# Patient Record
Sex: Female | Born: 1960 | Race: White | Hispanic: No | Marital: Married | Smoking: Former smoker
Health system: Southern US, Community
[De-identification: ages and names within clinical notes are randomized; demographics above are authoritative.]

## PROBLEM LIST (undated history)

## (undated) DIAGNOSIS — R0602 Shortness of breath: Secondary | ICD-10-CM

## (undated) DIAGNOSIS — R002 Palpitations: Secondary | ICD-10-CM

## (undated) DIAGNOSIS — M7989 Other specified soft tissue disorders: Secondary | ICD-10-CM

## (undated) DIAGNOSIS — R42 Dizziness and giddiness: Secondary | ICD-10-CM

## (undated) DIAGNOSIS — R12 Heartburn: Secondary | ICD-10-CM

## (undated) DIAGNOSIS — C801 Malignant (primary) neoplasm, unspecified: Secondary | ICD-10-CM

## (undated) DIAGNOSIS — M2550 Pain in unspecified joint: Secondary | ICD-10-CM

## (undated) DIAGNOSIS — I10 Essential (primary) hypertension: Secondary | ICD-10-CM

## (undated) DIAGNOSIS — Z9221 Personal history of antineoplastic chemotherapy: Secondary | ICD-10-CM

## (undated) DIAGNOSIS — C50919 Malignant neoplasm of unspecified site of unspecified female breast: Secondary | ICD-10-CM

## (undated) DIAGNOSIS — R569 Unspecified convulsions: Secondary | ICD-10-CM

## (undated) DIAGNOSIS — Z923 Personal history of irradiation: Secondary | ICD-10-CM

## (undated) DIAGNOSIS — K5900 Constipation, unspecified: Secondary | ICD-10-CM

## (undated) HISTORY — DX: Heartburn: R12

## (undated) HISTORY — PX: WISDOM TOOTH EXTRACTION: SHX21

## (undated) HISTORY — DX: Other specified soft tissue disorders: M79.89

## (undated) HISTORY — DX: Pain in unspecified joint: M25.50

## (undated) HISTORY — DX: Shortness of breath: R06.02

## (undated) HISTORY — PX: COLONOSCOPY: SHX174

## (undated) HISTORY — DX: Dizziness and giddiness: R42

## (undated) HISTORY — DX: Constipation, unspecified: K59.00

## (undated) HISTORY — DX: Palpitations: R00.2

---

## 1988-02-28 DIAGNOSIS — C801 Malignant (primary) neoplasm, unspecified: Secondary | ICD-10-CM

## 1988-02-28 HISTORY — DX: Malignant (primary) neoplasm, unspecified: C80.1

## 2006-03-01 ENCOUNTER — Emergency Department (HOSPITAL_COMMUNITY): Admission: EM | Admit: 2006-03-01 | Discharge: 2006-03-01 | Payer: Self-pay | Admitting: Emergency Medicine

## 2008-02-27 ENCOUNTER — Emergency Department (HOSPITAL_COMMUNITY): Admission: EM | Admit: 2008-02-27 | Discharge: 2008-02-27 | Payer: Self-pay | Admitting: Family Medicine

## 2015-02-28 DIAGNOSIS — Z9221 Personal history of antineoplastic chemotherapy: Secondary | ICD-10-CM

## 2015-02-28 DIAGNOSIS — Z923 Personal history of irradiation: Secondary | ICD-10-CM

## 2015-02-28 DIAGNOSIS — C50919 Malignant neoplasm of unspecified site of unspecified female breast: Secondary | ICD-10-CM

## 2015-02-28 HISTORY — DX: Malignant neoplasm of unspecified site of unspecified female breast: C50.919

## 2015-02-28 HISTORY — DX: Personal history of irradiation: Z92.3

## 2015-02-28 HISTORY — DX: Personal history of antineoplastic chemotherapy: Z92.21

## 2015-10-27 ENCOUNTER — Other Ambulatory Visit: Payer: Self-pay | Admitting: General Surgery

## 2015-11-04 ENCOUNTER — Other Ambulatory Visit: Payer: Self-pay | Admitting: General Surgery

## 2015-11-04 ENCOUNTER — Other Ambulatory Visit: Payer: Self-pay

## 2015-11-04 ENCOUNTER — Encounter (HOSPITAL_BASED_OUTPATIENT_CLINIC_OR_DEPARTMENT_OTHER): Payer: Self-pay | Admitting: *Deleted

## 2015-11-04 ENCOUNTER — Encounter (HOSPITAL_BASED_OUTPATIENT_CLINIC_OR_DEPARTMENT_OTHER)
Admission: RE | Admit: 2015-11-04 | Discharge: 2015-11-04 | Disposition: A | Discharge status: Home / Self Care | Payer: BLUE CROSS/BLUE SHIELD | Source: Ambulatory Visit | Attending: General Surgery | Admitting: General Surgery

## 2015-11-04 DIAGNOSIS — C50912 Malignant neoplasm of unspecified site of left female breast: Secondary | ICD-10-CM | POA: Insufficient documentation

## 2015-11-04 DIAGNOSIS — C50412 Malignant neoplasm of upper-outer quadrant of left female breast: Secondary | ICD-10-CM

## 2015-11-04 DIAGNOSIS — I10 Essential (primary) hypertension: Secondary | ICD-10-CM | POA: Diagnosis not present

## 2015-11-04 DIAGNOSIS — R569 Unspecified convulsions: Secondary | ICD-10-CM | POA: Insufficient documentation

## 2015-11-04 DIAGNOSIS — Z01812 Encounter for preprocedural laboratory examination: Secondary | ICD-10-CM | POA: Diagnosis present

## 2015-11-04 DIAGNOSIS — Z170 Estrogen receptor positive status [ER+]: Secondary | ICD-10-CM | POA: Diagnosis not present

## 2015-11-04 DIAGNOSIS — Z8541 Personal history of malignant neoplasm of cervix uteri: Secondary | ICD-10-CM | POA: Insufficient documentation

## 2015-11-04 DIAGNOSIS — C439 Malignant melanoma of skin, unspecified: Secondary | ICD-10-CM | POA: Diagnosis not present

## 2015-11-04 DIAGNOSIS — Z01810 Encounter for preprocedural cardiovascular examination: Secondary | ICD-10-CM | POA: Diagnosis present

## 2015-11-04 LAB — BASIC METABOLIC PANEL
Anion gap: 6 (ref 5–15)
BUN: 10 mg/dL (ref 6–20)
CALCIUM: 9.5 mg/dL (ref 8.9–10.3)
CO2: 29 mmol/L (ref 22–32)
CREATININE: 0.67 mg/dL (ref 0.44–1.00)
Chloride: 105 mmol/L (ref 101–111)
GFR calc non Af Amer: >60 mL/min (ref >60)
Glucose, Bld: 92 mg/dL (ref 65–99)
Potassium: 4.6 mmol/L (ref 3.5–5.1)
SODIUM: 140 mmol/L (ref 135–145)

## 2015-11-04 NOTE — Progress Notes (Signed)
Pt given 8oz carton of boost breeze with written and verbal instructions to drink morning of surgery by 1000am and no other liquid after midnight,  Teach back and pt voiced understanding

## 2015-11-15 ENCOUNTER — Telehealth: Payer: Self-pay | Admitting: Hematology

## 2015-11-15 ENCOUNTER — Encounter: Payer: Self-pay | Admitting: Hematology

## 2015-11-15 ENCOUNTER — Ambulatory Visit
Admission: RE | Admit: 2015-11-15 | Discharge: 2015-11-15 | Disposition: A | Discharge status: Home / Self Care | Payer: BLUE CROSS/BLUE SHIELD | Source: Ambulatory Visit | Attending: General Surgery | Admitting: General Surgery

## 2015-11-15 DIAGNOSIS — C50412 Malignant neoplasm of upper-outer quadrant of left female breast: Secondary | ICD-10-CM

## 2015-11-15 NOTE — Telephone Encounter (Signed)
Appt scheduled with Dr. Burr Medico on 9/26 at 2:30pm. Patient aware to arrive 30 minutes early. Demographics verified. Letter mailed to the patient.

## 2015-11-16 ENCOUNTER — Encounter (HOSPITAL_BASED_OUTPATIENT_CLINIC_OR_DEPARTMENT_OTHER): Payer: Self-pay | Admitting: Anesthesiology

## 2015-11-16 ENCOUNTER — Ambulatory Visit (HOSPITAL_BASED_OUTPATIENT_CLINIC_OR_DEPARTMENT_OTHER): Payer: BLUE CROSS/BLUE SHIELD | Admitting: Anesthesiology

## 2015-11-16 ENCOUNTER — Encounter (HOSPITAL_BASED_OUTPATIENT_CLINIC_OR_DEPARTMENT_OTHER)
Admission: RE | Disposition: A | Discharge status: Home / Self Care | Payer: Self-pay | Source: Ambulatory Visit | Attending: General Surgery

## 2015-11-16 ENCOUNTER — Ambulatory Visit (HOSPITAL_COMMUNITY)
Admission: RE | Admit: 2015-11-16 | Discharge: 2015-11-16 | Disposition: A | Discharge status: Home / Self Care | Payer: BLUE CROSS/BLUE SHIELD | Source: Ambulatory Visit | Attending: General Surgery | Admitting: General Surgery

## 2015-11-16 ENCOUNTER — Ambulatory Visit (HOSPITAL_BASED_OUTPATIENT_CLINIC_OR_DEPARTMENT_OTHER)
Admission: RE | Admit: 2015-11-16 | Discharge: 2015-11-16 | Disposition: A | Discharge status: Home / Self Care | Payer: BLUE CROSS/BLUE SHIELD | Source: Ambulatory Visit | Attending: General Surgery | Admitting: General Surgery

## 2015-11-16 ENCOUNTER — Ambulatory Visit
Admission: RE | Admit: 2015-11-16 | Discharge: 2015-11-16 | Disposition: A | Discharge status: Home / Self Care | Payer: Self-pay | Source: Ambulatory Visit | Attending: General Surgery | Admitting: General Surgery

## 2015-11-16 DIAGNOSIS — Z6841 Body Mass Index (BMI) 40.0 and over, adult: Secondary | ICD-10-CM | POA: Insufficient documentation

## 2015-11-16 DIAGNOSIS — C50412 Malignant neoplasm of upper-outer quadrant of left female breast: Secondary | ICD-10-CM

## 2015-11-16 DIAGNOSIS — I10 Essential (primary) hypertension: Secondary | ICD-10-CM | POA: Insufficient documentation

## 2015-11-16 DIAGNOSIS — C50912 Malignant neoplasm of unspecified site of left female breast: Secondary | ICD-10-CM

## 2015-11-16 DIAGNOSIS — Z79899 Other long term (current) drug therapy: Secondary | ICD-10-CM | POA: Insufficient documentation

## 2015-11-16 DIAGNOSIS — Z85820 Personal history of malignant melanoma of skin: Secondary | ICD-10-CM | POA: Diagnosis not present

## 2015-11-16 DIAGNOSIS — E669 Obesity, unspecified: Secondary | ICD-10-CM | POA: Insufficient documentation

## 2015-11-16 DIAGNOSIS — Z87891 Personal history of nicotine dependence: Secondary | ICD-10-CM | POA: Insufficient documentation

## 2015-11-16 DIAGNOSIS — Z8541 Personal history of malignant neoplasm of cervix uteri: Secondary | ICD-10-CM | POA: Diagnosis not present

## 2015-11-16 DIAGNOSIS — Z170 Estrogen receptor positive status [ER+]: Secondary | ICD-10-CM | POA: Diagnosis not present

## 2015-11-16 DIAGNOSIS — Z780 Asymptomatic menopausal state: Secondary | ICD-10-CM | POA: Insufficient documentation

## 2015-11-16 HISTORY — PX: BREAST LUMPECTOMY: SHX2

## 2015-11-16 HISTORY — PX: BREAST LUMPECTOMY WITH RADIOACTIVE SEED AND SENTINEL LYMPH NODE BIOPSY: SHX6550

## 2015-11-16 HISTORY — DX: Malignant (primary) neoplasm, unspecified: C80.1

## 2015-11-16 HISTORY — DX: Unspecified convulsions: R56.9

## 2015-11-16 HISTORY — DX: Essential (primary) hypertension: I10

## 2015-11-16 SURGERY — BREAST LUMPECTOMY WITH RADIOACTIVE SEED AND SENTINEL LYMPH NODE BIOPSY
Anesthesia: General | Site: Breast | Laterality: Left

## 2015-11-16 MED ORDER — PROMETHAZINE HCL 25 MG/ML IJ SOLN
6.2500 mg | INTRAMUSCULAR | Status: DC | PRN
  Administered 2015-11-16: 6.25 mg via INTRAVENOUS

## 2015-11-16 MED ORDER — DEXAMETHASONE SODIUM PHOSPHATE 10 MG/ML IJ SOLN
INTRAMUSCULAR | Status: AC
  Filled 2015-11-16: qty 1

## 2015-11-16 MED ORDER — CELECOXIB 200 MG PO CAPS
ORAL_CAPSULE | ORAL | Status: AC
  Filled 2015-11-16: qty 2

## 2015-11-16 MED ORDER — CELECOXIB 400 MG PO CAPS
400.0000 mg | ORAL_CAPSULE | ORAL | Status: AC
  Administered 2015-11-16: 200 mg via ORAL

## 2015-11-16 MED ORDER — LIDOCAINE 2% (20 MG/ML) 5 ML SYRINGE
INTRAMUSCULAR | Status: AC
  Filled 2015-11-16: qty 5

## 2015-11-16 MED ORDER — PHENYLEPHRINE 40 MCG/ML (10ML) SYRINGE FOR IV PUSH (FOR BLOOD PRESSURE SUPPORT)
PREFILLED_SYRINGE | INTRAVENOUS | Status: AC
  Filled 2015-11-16: qty 10

## 2015-11-16 MED ORDER — PROMETHAZINE HCL 25 MG/ML IJ SOLN
INTRAMUSCULAR | Status: AC
  Filled 2015-11-16: qty 1

## 2015-11-16 MED ORDER — CEFAZOLIN IN D5W 1 GM/50ML IV SOLN
INTRAVENOUS | Status: AC
  Filled 2015-11-16: qty 50

## 2015-11-16 MED ORDER — MIDAZOLAM HCL 2 MG/2ML IJ SOLN
1.0000 mg | INTRAMUSCULAR | Status: DC | PRN
  Administered 2015-11-16: 2 mg via INTRAVENOUS

## 2015-11-16 MED ORDER — HYDROMORPHONE HCL 1 MG/ML IJ SOLN
0.2500 mg | INTRAMUSCULAR | Status: DC | PRN
  Administered 2015-11-16: 0.5 mg via INTRAVENOUS

## 2015-11-16 MED ORDER — FENTANYL CITRATE (PF) 100 MCG/2ML IJ SOLN
INTRAMUSCULAR | Status: DC | PRN
  Administered 2015-11-16: 100 ug via INTRAVENOUS
  Administered 2015-11-16: 50 ug via INTRAVENOUS

## 2015-11-16 MED ORDER — SCOPOLAMINE 1 MG/3DAYS TD PT72: 1.0000 | MEDICATED_PATCH | Freq: Once | TRANSDERMAL | Status: DC | PRN

## 2015-11-16 MED ORDER — HYDROCODONE-ACETAMINOPHEN 5-325 MG PO TABS: 1.0000 | ORAL_TABLET | ORAL | 0 refills | Status: DC | PRN

## 2015-11-16 MED ORDER — BUPIVACAINE-EPINEPHRINE (PF) 0.5% -1:200000 IJ SOLN
INTRAMUSCULAR | Status: DC | PRN
  Administered 2015-11-16: 20 mL

## 2015-11-16 MED ORDER — PROPOFOL 10 MG/ML IV BOLUS
INTRAVENOUS | Status: DC | PRN
  Administered 2015-11-16: 200 mg via INTRAVENOUS

## 2015-11-16 MED ORDER — OXYCODONE HCL 5 MG PO TABS
5.0000 mg | ORAL_TABLET | Freq: Once | ORAL | Status: DC | PRN
Start: 2015-11-16 — End: 2015-11-16

## 2015-11-16 MED ORDER — FENTANYL CITRATE (PF) 100 MCG/2ML IJ SOLN
50.0000 ug | INTRAMUSCULAR | Status: DC | PRN
  Administered 2015-11-16 (×2): 50 ug via INTRAVENOUS

## 2015-11-16 MED ORDER — LACTATED RINGERS IV SOLN
INTRAVENOUS | Status: DC
  Administered 2015-11-16: 10 mL/h via INTRAVENOUS
  Administered 2015-11-16 (×3): via INTRAVENOUS

## 2015-11-16 MED ORDER — FENTANYL CITRATE (PF) 100 MCG/2ML IJ SOLN
INTRAMUSCULAR | Status: AC
  Filled 2015-11-16: qty 2

## 2015-11-16 MED ORDER — PROPOFOL 10 MG/ML IV BOLUS
INTRAVENOUS | Status: AC
  Filled 2015-11-16: qty 20

## 2015-11-16 MED ORDER — BUPIVACAINE-EPINEPHRINE (PF) 0.5% -1:200000 IJ SOLN
INTRAMUSCULAR | Status: DC | PRN
  Administered 2015-11-16: 30 mL

## 2015-11-16 MED ORDER — CHLORHEXIDINE GLUCONATE CLOTH 2 % EX PADS: 6.0000 | MEDICATED_PAD | Freq: Once | CUTANEOUS | Status: DC

## 2015-11-16 MED ORDER — MIDAZOLAM HCL 2 MG/2ML IJ SOLN
INTRAMUSCULAR | Status: AC
  Filled 2015-11-16: qty 2

## 2015-11-16 MED ORDER — LACTATED RINGERS IV SOLN: INTRAVENOUS | Status: DC

## 2015-11-16 MED ORDER — SODIUM CHLORIDE 0.9 % IJ SOLN
INTRAMUSCULAR | Status: AC
  Filled 2015-11-16: qty 10

## 2015-11-16 MED ORDER — EPHEDRINE SULFATE 50 MG/ML IJ SOLN
INTRAMUSCULAR | Status: DC | PRN
  Administered 2015-11-16: 10 mg via INTRAVENOUS

## 2015-11-16 MED ORDER — LIDOCAINE HCL (CARDIAC) 20 MG/ML IV SOLN
INTRAVENOUS | Status: DC | PRN
  Administered 2015-11-16: 30 mg via INTRAVENOUS

## 2015-11-16 MED ORDER — CEFAZOLIN SODIUM-DEXTROSE 2-4 GM/100ML-% IV SOLN
INTRAVENOUS | Status: AC
  Filled 2015-11-16: qty 100

## 2015-11-16 MED ORDER — METHYLENE BLUE 0.5 % INJ SOLN
INTRAVENOUS | Status: AC
  Filled 2015-11-16: qty 10

## 2015-11-16 MED ORDER — GABAPENTIN 300 MG PO CAPS
300.0000 mg | ORAL_CAPSULE | ORAL | Status: AC
  Administered 2015-11-16: 300 mg via ORAL

## 2015-11-16 MED ORDER — CEFAZOLIN SODIUM-DEXTROSE 2-4 GM/100ML-% IV SOLN
2.0000 g | INTRAVENOUS | Status: AC
  Administered 2015-11-16: 3 g via INTRAVENOUS

## 2015-11-16 MED ORDER — ONDANSETRON HCL 4 MG/2ML IJ SOLN
INTRAMUSCULAR | Status: AC
  Filled 2015-11-16: qty 2

## 2015-11-16 MED ORDER — PHENYLEPHRINE HCL 10 MG/ML IJ SOLN
INTRAMUSCULAR | Status: DC | PRN
  Administered 2015-11-16 (×3): 80 ug via INTRAVENOUS

## 2015-11-16 MED ORDER — MEPERIDINE HCL 25 MG/ML IJ SOLN: 6.2500 mg | INTRAMUSCULAR | Status: DC | PRN

## 2015-11-16 MED ORDER — TECHNETIUM TC 99M SULFUR COLLOID FILTERED
1.0000 | Freq: Once | INTRAVENOUS | Status: AC | PRN
  Administered 2015-11-16: 1 via INTRADERMAL

## 2015-11-16 MED ORDER — HYDROMORPHONE HCL 1 MG/ML IJ SOLN
INTRAMUSCULAR | Status: AC
  Filled 2015-11-16: qty 1

## 2015-11-16 MED ORDER — ACETAMINOPHEN 500 MG PO TABS
ORAL_TABLET | ORAL | Status: AC
  Filled 2015-11-16: qty 2

## 2015-11-16 MED ORDER — MIDAZOLAM HCL 5 MG/5ML IJ SOLN
INTRAMUSCULAR | Status: DC | PRN
  Administered 2015-11-16: 2 mg via INTRAVENOUS

## 2015-11-16 MED ORDER — GLYCOPYRROLATE 0.2 MG/ML IJ SOLN
0.2000 mg | Freq: Once | INTRAMUSCULAR | Status: DC | PRN
Start: 2015-11-16 — End: 2015-11-16

## 2015-11-16 MED ORDER — DEXAMETHASONE SODIUM PHOSPHATE 4 MG/ML IJ SOLN
INTRAMUSCULAR | Status: DC | PRN
  Administered 2015-11-16: 10 mg via INTRAVENOUS

## 2015-11-16 MED ORDER — GABAPENTIN 300 MG PO CAPS
ORAL_CAPSULE | ORAL | Status: AC
  Filled 2015-11-16: qty 1

## 2015-11-16 MED ORDER — OXYCODONE HCL 5 MG/5ML PO SOLN: 5.0000 mg | Freq: Once | ORAL | Status: DC | PRN

## 2015-11-16 MED ORDER — ACETAMINOPHEN 500 MG PO TABS
1000.0000 mg | ORAL_TABLET | ORAL | Status: AC
  Administered 2015-11-16: 1000 mg via ORAL

## 2015-11-16 SURGICAL SUPPLY — 53 items
BINDER BREAST 3XL (BIND) ×2 IMPLANT
BINDER BREAST LRG (GAUZE/BANDAGES/DRESSINGS) IMPLANT
BINDER BREAST MEDIUM (GAUZE/BANDAGES/DRESSINGS) IMPLANT
BINDER BREAST XLRG (GAUZE/BANDAGES/DRESSINGS) IMPLANT
BINDER BREAST XXLRG (GAUZE/BANDAGES/DRESSINGS) IMPLANT
BLADE SURG 15 STRL LF DISP TIS (BLADE) ×1 IMPLANT
BLADE SURG 15 STRL SS (BLADE) ×3
CANISTER SUC SOCK COL 7IN (MISCELLANEOUS) IMPLANT
CANISTER SUCT 1200ML W/VALVE (MISCELLANEOUS) ×3 IMPLANT
CHLORAPREP W/TINT 26ML (MISCELLANEOUS) ×3 IMPLANT
CLIP TI MEDIUM 6 (CLIP) ×3 IMPLANT
CLIP TI WIDE RED SMALL 6 (CLIP) ×2 IMPLANT
COVER BACK TABLE 60X90IN (DRAPES) ×3 IMPLANT
COVER MAYO STAND STRL (DRAPES) ×3 IMPLANT
COVER PROBE W GEL 5X96 (DRAPES) ×3 IMPLANT
DECANTER SPIKE VIAL GLASS SM (MISCELLANEOUS) IMPLANT
DEVICE DUBIN W/COMP PLATE 8390 (MISCELLANEOUS) ×3 IMPLANT
DRAPE LAPAROSCOPIC ABDOMINAL (DRAPES) ×3 IMPLANT
DRAPE UTILITY XL STRL (DRAPES) ×3 IMPLANT
ELECT COATED BLADE 2.86 ST (ELECTRODE) ×3 IMPLANT
ELECT REM PT RETURN 9FT ADLT (ELECTROSURGICAL) ×3
ELECTRODE REM PT RTRN 9FT ADLT (ELECTROSURGICAL) ×1 IMPLANT
GLOVE BIOGEL PI IND STRL 7.0 (GLOVE) IMPLANT
GLOVE BIOGEL PI IND STRL 8 (GLOVE) ×1 IMPLANT
GLOVE BIOGEL PI INDICATOR 7.0 (GLOVE) ×2
GLOVE BIOGEL PI INDICATOR 8 (GLOVE) ×2
GLOVE ECLIPSE 6.5 STRL STRAW (GLOVE) ×2 IMPLANT
GLOVE ECLIPSE 7.5 STRL STRAW (GLOVE) ×5 IMPLANT
GLOVE EXAM NITRILE EXT CUFF MD (GLOVE) ×2 IMPLANT
GOWN STRL REUS W/ TWL LRG LVL3 (GOWN DISPOSABLE) ×1 IMPLANT
GOWN STRL REUS W/ TWL XL LVL3 (GOWN DISPOSABLE) ×1 IMPLANT
GOWN STRL REUS W/TWL LRG LVL3 (GOWN DISPOSABLE) ×3
GOWN STRL REUS W/TWL XL LVL3 (GOWN DISPOSABLE) ×3
ILLUMINATOR WAVEGUIDE N/F (MISCELLANEOUS) ×3 IMPLANT
KIT MARKER MARGIN INK (KITS) ×3 IMPLANT
LIQUID BAND (GAUZE/BANDAGES/DRESSINGS) ×3 IMPLANT
NDL HYPO 25X1 1.5 SAFETY (NEEDLE) ×2 IMPLANT
NDL SAFETY ECLIPSE 18X1.5 (NEEDLE) ×1 IMPLANT
NEEDLE HYPO 18GX1.5 SHARP (NEEDLE)
NEEDLE HYPO 25X1 1.5 SAFETY (NEEDLE) ×3 IMPLANT
NS IRRIG 1000ML POUR BTL (IV SOLUTION) ×2 IMPLANT
PACK BASIN DAY SURGERY FS (CUSTOM PROCEDURE TRAY) ×3 IMPLANT
PENCIL BUTTON HOLSTER BLD 10FT (ELECTRODE) ×3 IMPLANT
SLEEVE SCD COMPRESS KNEE MED (MISCELLANEOUS) ×3 IMPLANT
SPONGE LAP 4X18 X RAY DECT (DISPOSABLE) ×3 IMPLANT
SUT MON AB 5-0 PS2 18 (SUTURE) ×3 IMPLANT
SUT VICRYL 3-0 CR8 SH (SUTURE) ×5 IMPLANT
SYR CONTROL 10ML LL (SYRINGE) ×4 IMPLANT
TOWEL OR 17X24 6PK STRL BLUE (TOWEL DISPOSABLE) ×3 IMPLANT
TOWEL OR NON WOVEN STRL DISP B (DISPOSABLE) ×3 IMPLANT
TUBE CONNECTING 20'X1/4 (TUBING) ×1
TUBE CONNECTING 20X1/4 (TUBING) ×1 IMPLANT
YANKAUER SUCT BULB TIP NO VENT (SUCTIONS) ×2 IMPLANT

## 2015-11-16 NOTE — Interval H&P Note (Signed)
History and Physical Interval Note:  11/16/2015 11:42 AM  Nicole Schmitt  has presented today for surgery, with the diagnosis of left breast cancer  The various methods of treatment have been discussed with the patient and family. After consideration of risks, benefits and other options for treatment, the patient has consented to  Procedure(s): BREAST LUMPECTOMY WITH RADIOACTIVE SEED AND SENTINEL LYMPH NODE BIOPSY (Left) as a surgical intervention .  The patient's history has been reviewed, patient examined, no change in status, stable for surgery.  I have reviewed the patient's chart and labs.  Questions were answered to the patient's satisfaction.     Tashaya Ancrum T

## 2015-11-16 NOTE — Anesthesia Procedure Notes (Signed)
Anesthesia Regional Block:  Pectoralis block  Pre-Anesthetic Checklist: ,, timeout performed, Correct Patient, Correct Site, Correct Laterality, Correct Procedure, Correct Position, site marked, Risks and benefits discussed,  Surgical consent,  Pre-op evaluation,  At surgeon's request and post-op pain management  Laterality: Left  Prep: chloraprep       Needles:  Injection technique: Single-shot  Needle Type: Echogenic Needle     Needle Length: 9cm 9 cm Needle Gauge: 21 and 21 G    Additional Needles:  Procedures: ultrasound guided (picture in chart) Pectoralis block Narrative:  Start time: 11/16/2015 11:10 AM End time: 11/16/2015 11:13 AM Injection made incrementally with aspirations every 5 mL.  Performed by: Personally  Anesthesiologist: Suella Broad D  Additional Notes: Pt tolerated well. No immediate complications noted.

## 2015-11-16 NOTE — Anesthesia Procedure Notes (Signed)
Procedure Name: LMA Insertion Date/Time: 11/16/2015 11:58 AM Performed by: Toula Moos L Pre-anesthesia Checklist: Patient identified, Emergency Drugs available, Suction available, Patient being monitored and Timeout performed Patient Re-evaluated:Patient Re-evaluated prior to inductionOxygen Delivery Method: Circle system utilized Preoxygenation: Pre-oxygenation with 100% oxygen Intubation Type: IV induction Ventilation: Mask ventilation without difficulty LMA: LMA inserted LMA Size: 3.0 Number of attempts: 1 Airway Equipment and Method: Bite block Placement Confirmation: positive ETCO2 Tube secured with: Tape Dental Injury: Teeth and Oropharynx as per pre-operative assessment

## 2015-11-16 NOTE — H&P (Signed)
History of Present Illness Marland Kitchen T. Ilia Dimaano MD; 11/04/2015 10:33 AM) Patient words: New breast cancer.  The patient is a 55 year old female who presents with breast cancer. She is a post menopausal female referred by Dr. Oswaldo Done and Heist for evaluation of recently diagnosed carcinoma of the left breast. 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 Her imaging and workup has been done in [place]. She recently presented for a screening mamogram revealing a possible mass in the left breast. Subsequent imaging included diagnostic mamogram showing a persistent area of asymmetric density in the left breast and ultrasound showing a 1.8 x 0.7 cm lobulated mass at the 2 o'clock position in the left breast 6 cm from the nipple. An ultrasound guided breast biopsy was performed on October 27, 2015 with pathology revealing invasive ductal carcinoma of the breast. She is seen now in the office for initial treatment planning. She has experienced no breast symptoms, specifically lump or pain or nipple discharge or skin changes. She does not have a personal history of any previous breast problems. Only family history for breast cancer is a cousin  Findings at that time were the following: Tumor size: 1.8 cm Tumor grade: Ki-67 30% Estrogen Receptor: Positive Progesterone Receptor: Positive Her-2 neu: Equivocal, FISH pending Lymph node status: Ultrasound report does not indicate lymph node assessment    Other Problems Patsey Berthold, CMA; 11/04/2015 10:06 AM) Breast Cancer Cervical Cancer High blood pressure Lump In Breast Melanoma Seizure Disorder  Past Surgical History Patsey Berthold, [place]; 11/04/2015 10:06 AM) Breast Biopsy Left. Cesarean Section - 1  Diagnostic Studies History Patsey Berthold, [place]; 11/04/2015 10:06 AM) Colonoscopy 1-5 years ago Mammogram within last year Pap Smear 1-5 years ago  Allergies Patsey Berthold, [place]; 11/04/2015  10:07 AM) No Known Drug Allergies09/08/2015  Medication History Patsey Berthold, CMA; 11/04/2015 10:07 AM) Valsartan (160MG Tablet, Oral) Active. HydroCHLOROthiazide (Oral) Specific dose unknown - Active. Tylenol (325MG Tablet, Oral as needed) Active. Medications Reconciled  Social History Patsey Berthold, CMA; 11/04/2015 10:06 AM) Alcohol use Occasional alcohol use. Caffeine use Carbonated beverages, Coffee, Tea. No drug use Tobacco use Former smoker.  Family History Patsey Berthold, [place]; 11/04/2015 10:06 AM) Arthritis Brother, Mother. Cerebrovascular Accident Mother. Depression Mother. Heart Disease Father. Hypertension Brother, Father. Seizure disorder Mother.  Pregnancy / Birth History Patsey Berthold, [place]; 11/04/2015 10:06 AM) Age at menarche 89 years. Age of menopause 51-55 Contraceptive History Oral contraceptives. Gravida 1 Length (months) of breastfeeding 3-6 Maternal age 37-40 Para 1    Review of Systems Patsey Berthold CMA; 11/04/2015 10:06 AM) General Not Present- Appetite Loss, Chills, Fatigue, Fever, Night Sweats, Weight Gain and Weight Loss. Skin Not Present- Change in Wart/Mole, Dryness, Hives, Jaundice, New Lesions, Non-Healing Wounds, Rash and Ulcer. HEENT Present- Seasonal Allergies and Wears glasses/contact lenses. Not Present- Earache, Hearing Loss, Hoarseness, Nose Bleed, Oral Ulcers, Ringing in the Ears, Sinus Pain, Sore Throat, Visual Disturbances and Yellow Eyes. Respiratory Present- Snoring. Not Present- Bloody sputum, Chronic Cough, Difficulty Breathing and Wheezing. Breast Present- Breast Mass. Not Present- Breast Pain, Nipple Discharge and Skin Changes. Cardiovascular Not Present- Chest Pain, Difficulty Breathing Lying Down, Leg Cramps, Palpitations, Rapid Heart Rate, Shortness of Breath and Swelling of Extremities. Gastrointestinal Not Present- Abdominal Pain, Bloating, Bloody Stool, Change in Bowel Habits, Chronic diarrhea, Constipation,  Difficulty Swallowing, Excessive gas, Gets full quickly at meals, Hemorrhoids, Indigestion, Nausea, Rectal Pain and Vomiting. Female Genitourinary Not Present- Frequency, Nocturia, Painful Urination, Pelvic Pain  and Urgency. Musculoskeletal Not Present- Back Pain, Joint Pain, Joint Stiffness, Muscle Pain, Muscle Weakness and Swelling of Extremities. Neurological Not Present- Decreased Memory, Fainting, Headaches, Numbness, Seizures, Tingling, Tremor, Trouble walking and Weakness. Psychiatric Not Present- Anxiety, Bipolar, Change in Sleep Pattern, Depression, Fearful and Frequent crying. Endocrine Not Present- Cold Intolerance, Excessive Hunger, Hair Changes, Heat Intolerance, Hot flashes and New Diabetes. Hematology Not Present- Blood Thinners, Easy Bruising, Excessive bleeding, Gland problems, HIV and Persistent Infections.  Vitals Patsey Berthold CMA; 11/04/2015 10:08 AM) 11/04/2015 10:07 AM Weight: 266.6 lb Height: 65in Height was reported by patient. Body Surface Area: 2.24 m Body Mass Index: 44.36 kg/m  Temp.: 98.18F  Pulse: 64 (Regular)  BP: 124/80 (Sitting, Left Arm, Standard)       Physical Exam Marland Kitchen T. Destane Speas MD; 11/04/2015 10:34 AM) The physical exam findings are as follows: Note:General: Alert, obese Caucasian female, in no distress Skin: Warm and dry without rash or infection. HEENT: No palpable masses or thyromegaly. Sclera nonicteric. Lymph nodes: No cervical, supraclavicular, or inguinal nodes palpable. Breasts: No palpable masses in either breast. No skin changes or nipple inversion or crusting. No palpable axillary adenopathy. Lungs: Breath sounds clear and equal. No wheezing or increased work of breathing. Cardiovascular: Regular rate and rhythm without murmer. No JVD or edema. Peripheral pulses intact. No carotid bruits. Abdomen: Nondistended. Soft and nontender. No masses palpable. No organomegaly. No palpable hernias. Extremities: No edema or joint  swelling or deformity. No chronic venous stasis changes. Neurologic: Alert and fully oriented. Gait normal. No focal weakness. Psychiatric: Normal mood and affect. Thought content appropriate with normal judgement and insight    Assessment & Plan Marland Kitchen T. Reneka Nebergall MD; 11/04/2015 10:51 AM) MALIGNANT NEOPLASM OF LEFT BREAST, STAGE 1, ESTROGEN RECEPTOR POSITIVE (C50.912) Impression: 55 year old female with a new diagnosis of cancer of the left breast, upper outer quadrant. Clinical stage 1A, ER positive, PR positive, HER-2 equivocal, FISH pending. I discussed with the patient and her husband today initial surgical treatment options. We discussed options of breast conservation with lumpectomy or total mastectomy and sentinal lymph node biopsy/dissection. After discussion they have elected to proceed with radioactive seed localized left breast lumpectomy and left axillary sentinel lymph node biopsy. We discussed the indications and nature of the procedure, and expected recovery, in detail. Surgical risks including anesthetic complications, cardiorespiratory complications, bleeding, infection, wound healing complications, blood clots, lymphedema, local and distant recurrence and possible need for further surgery based on the final pathology was discussed and understood. Chemotherapy, hormonal therapy and radiation therapy have been discussed. They have been provided with literature regarding the treatment of breast cancer. All questions were answered. They understand and agree to proceed and we will go ahead with scheduling. Current Plans Referred to Oncology, for evaluation and follow up (Oncology). Routine. Referred to Radiation Oncology, for evaluation and follow up (Radiation Oncology). Routine. Referred to Physical Therapy, for evaluation and follow up (Physical Therapy). Routine. Pt Education - [facility] Breast Cancer Information Given - [facility] "Breast Journey" Package Pt Education - Pamphlet Given -  Breast Biopsy: discussed with patient and provided information. Radioactive seed localized left breast lumpectomy and left axillary sentinel lymph node biopsy under general anesthesia as an outpatient

## 2015-11-16 NOTE — Progress Notes (Signed)
Assisted Dr. Hollis with left, ultrasound guided, pectoralis block. Side rails up, monitors on throughout procedure. See vital signs in flow sheet. Tolerated Procedure well. 

## 2015-11-16 NOTE — Anesthesia Preprocedure Evaluation (Addendum)
Anesthesia Evaluation  Patient identified by MRN, date of birth, ID band Patient awake    Reviewed: Allergy & Precautions, NPO status , Patient's Chart, lab work & pertinent test results  Airway Mallampati: III  TM Distance: >3 FB Neck ROM: Full    Dental  (+) Teeth Intact, Dental Advisory Given   Pulmonary neg pulmonary ROS,    breath sounds clear to auscultation       Cardiovascular hypertension, Pt. on medications  Rhythm:Regular Rate:Normal     Neuro/Psych Seizures -,  negative psych ROS   GI/Hepatic negative GI ROS, Neg liver ROS,   Endo/Other  negative endocrine ROS  Renal/GU negative Renal ROS  negative genitourinary   Musculoskeletal negative musculoskeletal ROS (+)   Abdominal (+) + obese,  Abdomen: soft.    Peds negative pediatric ROS (+)  Hematology negative hematology ROS (+)   Anesthesia Other Findings   Reproductive/Obstetrics negative OB ROS                           10/2015 EKG: normal sinus rhythm.  Anesthesia Physical Anesthesia Plan  ASA: III  Anesthesia Plan: General   Post-op Pain Management: GA combined w/ Regional for post-op pain   Induction: Intravenous  Airway Management Planned: LMA  Additional Equipment:   Intra-op Plan:   Post-operative Plan: Extubation in OR  Informed Consent: I have reviewed the patients History and Physical, chart, labs and discussed the procedure including the risks, benefits and alternatives for the proposed anesthesia with the patient or authorized representative who has indicated his/her understanding and acceptance.   Dental advisory given  Plan Discussed with: CRNA  Anesthesia Plan Comments:        Anesthesia Quick Evaluation

## 2015-11-16 NOTE — Discharge Instructions (Signed)
[facility] °Office Phone Number [phone] ° °BREAST BIOPSY/ PARTIAL MASTECTOMY: POST OP INSTRUCTIONS ° °Always review your discharge instruction sheet given to you by the facility where your surgery was performed. ° °IF YOU HAVE DISABILITY OR FAMILY LEAVE FORMS, YOU MUST BRING THEM TO THE OFFICE FOR PROCESSING.  DO NOT GIVE THEM TO YOUR DOCTOR. ° °1. A prescription for pain medication may be given to you upon discharge.  Take your pain medication as prescribed, if needed.  If narcotic pain medicine is not needed, then you may take acetaminophen (Tylenol) or ibuprofen (Advil) as needed. °2. Take your usually prescribed medications unless otherwise directed °3. If you need a refill on your pain medication, please contact your pharmacy.  They will contact our office to request authorization.  Prescriptions will not be filled after 5pm or on week-ends. °4. You should eat very light the first 24 hours after surgery, such as soup, crackers, pudding, etc.  Resume your normal diet the day after surgery. °5. Most patients will experience some swelling and bruising in the breast.  Ice packs and a good support bra will help.  Swelling and bruising can take several days to resolve.  °6. It is common to experience some constipation if taking pain medication after surgery.  Increasing fluid intake and taking a stool softener will usually help or prevent this problem from occurring.  A mild laxative (Milk of Magnesia or Miralax) should be taken according to package directions if there are no bowel movements after 48 hours. °7. Unless discharge instructions indicate otherwise, you may remove your bandages 24-48 hours after surgery, and you may shower at that time.  You may have steri-strips (small skin tapes) in place directly over the incision.  These strips should be left on the skin for 7-10 days.  If your surgeon used skin glue on the incision, you may shower in 24 hours.  The glue will flake off over the  next 2-3 weeks.  Any sutures or staples will be removed at the office during your follow-up visit. °8. ACTIVITIES:  You may resume regular daily activities (gradually increasing) beginning the next day.  Wearing a good support bra or sports bra minimizes pain and swelling.  You may have sexual intercourse when it is comfortable. °a. You may drive when you no longer are taking prescription pain medication, you can comfortably wear a seatbelt, and you can safely maneuver your car and apply brakes. °b. RETURN TO WORK:  ______________________________________________________________________________________ °9. You should see your doctor in the office for a follow-up appointment approximately two weeks after your surgery.  Your doctor’s nurse will typically make your follow-up appointment when she calls you with your pathology report.  Expect your pathology report 2-3 business days after your surgery.  You may call to check if you do not hear from us after three days. °10. OTHER INSTRUCTIONS: _______________________________________________________________________________________________ _____________________________________________________________________________________________________________________________________ °_____________________________________________________________________________________________________________________________________ °_____________________________________________________________________________________________________________________________________ ° °WHEN TO CALL YOUR DOCTOR: °1. Fever over 101.0 °2. Nausea and/or vomiting. °3. Extreme swelling or bruising. °4. Continued bleeding from incision. °5. Increased pain, redness, or drainage from the incision. ° °The clinic staff is available to answer your questions during regular business hours.  Please don’t hesitate to call and ask to speak to one of the nurses for clinical concerns.  If you have a medical emergency, go to the nearest  emergency room or call 911.  A surgeon from [facility] is always on call at the hospital. ° °For further questions, please visit [email or web address]  ° ° ° °  Post Anesthesia Home Care Instructions ° °Activity: °Get plenty of rest for the remainder of the day. A responsible adult should stay with you for 24 hours following the procedure.  °For the next 24 hours, DO NOT: °-Drive a car °-Operate machinery °-Drink alcoholic beverages °-Take any medication unless instructed by your physician °-Make any legal decisions or sign important papers. ° °Meals: °Start with liquid foods such as gelatin or soup. Progress to regular foods as tolerated. Avoid greasy, spicy, heavy foods. If nausea and/or vomiting occur, drink only clear liquids until the nausea and/or vomiting subsides. Call your physician if vomiting continues. ° °Special Instructions/Symptoms: °Your throat may feel dry or sore from the anesthesia or the breathing tube placed in your throat during surgery. If this causes discomfort, gargle with warm salt water. The discomfort should disappear within 24 hours. ° °If you had a scopolamine patch placed behind your ear for the management of post- operative nausea and/or vomiting: ° °1. The medication in the patch is effective for 72 hours, after which it should be removed.  Wrap patch in a tissue and discard in the trash. Wash hands thoroughly with soap and water. °2. You may remove the patch earlier than 72 hours if you experience unpleasant side effects which may include dry mouth, dizziness or visual disturbances. °3. Avoid touching the patch. Wash your hands with soap and water after contact with the patch. °  ° °

## 2015-11-16 NOTE — Anesthesia Postprocedure Evaluation (Signed)
Anesthesia Post Note  Patient: Nicole Schmitt  Procedure(s) Performed: Procedure(s) (LRB): BREAST LUMPECTOMY WITH RADIOACTIVE SEED AND SENTINEL LYMPH NODE BIOPSY (Left)  Patient location during evaluation: PACU Anesthesia Type: General Level of consciousness: awake and alert Pain management: pain level controlled Vital Signs Assessment: post-procedure vital signs reviewed and stable Respiratory status: spontaneous breathing, nonlabored ventilation and respiratory function stable Cardiovascular status: blood pressure returned to baseline and stable Postop Assessment: no signs of nausea or vomiting Anesthetic complications: no    Last Vitals:  Vitals:   11/16/15 1445 11/16/15 1515  BP: 112/70 129/73  Pulse: 69 65  Resp: 17 16  Temp:  36.7 C    Last Pain:  Vitals:   11/16/15 1445  TempSrc:   PainSc: 2                  Alithea Lapage A

## 2015-11-16 NOTE — Transfer of Care (Signed)
Immediate Anesthesia Transfer of Care Note  Patient: Nicole Schmitt  Procedure(s) Performed: Procedure(s): BREAST LUMPECTOMY WITH RADIOACTIVE SEED AND SENTINEL LYMPH NODE BIOPSY (Left)  Patient Location: PACU  Anesthesia Type:GA combined with regional for post-op pain  Level of Consciousness: sedated  Airway & Oxygen Therapy: Patient Spontanous Breathing and Patient connected to face mask oxygen  Post-op Assessment: Report given to RN and Post -op Vital signs reviewed and stable  Post vital signs: Reviewed and stable  Last Vitals:  Vitals:   11/16/15 1130 11/16/15 1135  BP: 118/70   Pulse: 79 82  Resp: 19 15  Temp:      Last Pain:  Vitals:   11/16/15 1036  TempSrc: Oral         Complications: No apparent anesthesia complications

## 2015-11-16 NOTE — Op Note (Signed)
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 ER PR positive and HER-2 negative. After extensive consultation and discussion detailed elsewhere we have elected to proceed with radioactive seed localized lumpectomy and axillary sentinel lymph node biopsy as initial surgical treatment.    Procedure Detail:  She had previously undergone active placement of radioactive seed at the tumor clip site. In the holding area she underwent a pectoral block by anesthesia and underwent injection of 1 mCi of technetium sulfur colloid intradermally around the left nipple. She was taken operating room, placed in the supine position on the operating table, and laryngeal mask general anesthesia induced. I confirmed a localized area of high counts in the axilla with the neoprobe. The left breast and axilla were widely sterilely prepped and draped. She received preoperative IV antibiotics. PAS were in place. Patient timeout was performed and correct procedure verified. A curvilinear incision was made at the areolar border and dissection carried down to the saphenous tissue. A skin subcutaneous flap was then raised laterally out over the tumor site. The tumor was fairly superficial and at the site of the seton tumor the flap was thinned to skin only. The area of the tumor high counts was then easily exposed and using the neoprobe for guidance a globular specimen of breast tissue was excised around the seed and there was a small palpable mass also centrally located. The specimen was removed and  inked for margins. Specimen x-ray showed the seton marking clip and tumor centrally located. This was sent for permanent pathology. Hemostasis was attained in the wound. The lumpectomy cavity was marked with clips. Breast and subcutaneous cutaneous tissue was closed with interrupted 3-0 Vicryl. Attention was turned to the sentinel lymph node biopsy. A small transverse incision was made over the area of high counts in the axilla and dissection was carried down through the subcutaneous cutaneous tissue using cautery. The clavipectoral fascia was incised. Using the neoprobe for guidance I bluntly dissected down onto a slightly enlarged but not firm lymph node which was completely excised with cautery and ex vivo had counts of almost 400. I could not feel any enlarged lymph nodes in the axilla and background counts were almost 0, never over 10. This was sent as axillary sentinel lymph node. Hemostasis was assured and the deep axillary and subclavian is tissue was closed with interrupted 3-0 Vicryl. The skin incisions were infiltrated with Marcaine and skin closed with subcuticular 5-0 Monocryl and Liquiban. Sponge needle and instrument counts were correct.    Findings: As above  Estimated Blood Loss:  Minimal         Drains: None  Blood Given: none          Specimens: #1 left breast lumpectomy   #2 left axillary sentinel lymph node        Complications:  * No complications entered in OR log *         Disposition: PACU - hemodynamically stable.         Condition: stable

## 2015-11-17 ENCOUNTER — Encounter (HOSPITAL_BASED_OUTPATIENT_CLINIC_OR_DEPARTMENT_OTHER): Payer: Self-pay | Admitting: General Surgery

## 2015-11-17 NOTE — Addendum Note (Signed)
Addendum  created 11/17/15 1016 by Tawni Millers, CRNA   Charge Capture section accepted

## 2015-11-23 ENCOUNTER — Ambulatory Visit (HOSPITAL_BASED_OUTPATIENT_CLINIC_OR_DEPARTMENT_OTHER): Payer: BLUE CROSS/BLUE SHIELD | Admitting: Hematology

## 2015-11-23 ENCOUNTER — Telehealth: Payer: Self-pay | Admitting: Hematology

## 2015-11-23 ENCOUNTER — Encounter: Payer: Self-pay | Admitting: *Deleted

## 2015-11-23 ENCOUNTER — Encounter: Payer: Self-pay | Admitting: Hematology

## 2015-11-23 DIAGNOSIS — C50412 Malignant neoplasm of upper-outer quadrant of left female breast: Secondary | ICD-10-CM

## 2015-11-23 DIAGNOSIS — Z170 Estrogen receptor positive status [ER+]: Secondary | ICD-10-CM | POA: Diagnosis not present

## 2015-11-23 NOTE — Progress Notes (Signed)
[facility]  [phone] [phone]  Clinic New Consult Note   Patient Care Team: Pcp Not In System as PCP - General 11/23/2015  Referring physician: Dr. Excell Seltzer  CHIEF COMPLAINTS/PURPOSE OF CONSULTATION:  Left breast cancer  Oncology History   Breast cancer of upper-outer quadrant of left female breast [facility])   Staging form: Breast, AJCC 7th Edition   - Pathologic: Stage IIA (T2, N0, cM0) - Unsigned      Breast cancer of upper-outer quadrant of left female breast ([place])   10/20/2015 Mammogram    Screening mammogram showed new nodular density on the left breast, measuring about 2 cm. the well defined nodules bilaterally are otherwise stable.      10/27/2015 Initial Biopsy    Left breast 2:00 position mass biopsy showed infiltrating ductal carcinoma.       10/27/2015 Initial Diagnosis    Breast cancer of upper-outer quadrant of left female breast ([place])      10/27/2015 Receptors her2    ER 100% positive, strong staining, PR 90% positive, strong staining, HER-2 IHC 2+, FISH negative.      11/16/2015 Surgery    Left breast lumpectomy and sentinel lymph node biopsy.      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 Margins were negative. One sentinel lymph node was negative. Lymphovascular invasion was negative.       HISTORY OF PRESENTING ILLNESS:  Nicole Schmitt 55 y.o. female is here because of Her recently resected left breast cancer. She presents to the clinic by herself. She was referred by her surgeon Dr. Excell Seltzer.  She lives just outside of [place] but works as an Therapist, sports on the Advertising account planner at [employer]. Her breast cancer was discovered by screening mammogram, and her initial workup was done in [place].She recently underwent a screening mamogram which revealed a possible mass in the left breast. Subsequent imaging included diagnostic mamogram  showing a persistent area of asymmetric density in the left breast and ultrasound showing a 1.8 x 0.7 cm lobulated mass at the 2 o'clock position in the left breast 6 cm from the nipple. An ultrasound guided breast biopsy was performed on October 27, 2015 with pathology revealing invasive ductal carcinoma of the breast. She was referred to (surgeon Dr. Excell Seltzer, and underwent left breast lumpectomy and sentinel lymph node biopsy on 11/16/2015. The surgical pathology showed invasive grade 3 ductal carcinoma, 2.2 cm, one lymph node was negative.   She has been recovering well from her surgery. The pain to this surgical incision is minimal, she does not take much pain medication. No limited range of movement of left shoulder. Her appetite and energy level has been back to normal, she denies any other symptoms.  Her husband was diagnosed with metastatic melanoma in early 2016, has been receiving immunotherapy at [facility]. Her father was also recently diagnosed with CLL, but does not quite treatment. She has been under lot of stress in the past few years.  MEDICAL HISTORY:  Past Medical History:  Diagnosis Date  . Cancer ([place]) 1990   cervical   . Hypertension   . Seizures ([place])    > 20 years following fall from horse    SURGICAL HISTORY: Past Surgical History:  Procedure Laterality Date  . BREAST LUMPECTOMY WITH RADIOACTIVE SEED AND SENTINEL LYMPH NODE BIOPSY Left 11/16/2015   Procedure: BREAST LUMPECTOMY WITH RADIOACTIVE SEED AND SENTINEL LYMPH NODE BIOPSY;  Surgeon:  Excell Seltzer, MD;  Location: [facility];  Service: General;  Laterality: Left;  . CESAREAN SECTION      SOCIAL HISTORY: Social History   Social History  . Marital status: Married    Spouse name: N/A  . Number of children: N/A  . Years of education: N/A   Occupational History  . Not on file.   Social History Main Topics  . Smoking status: Never Smoker  . Smokeless tobacco: Never Used  . Alcohol use Yes      Comment: rare  . Drug use: No  . Sexual activity: Not on file   Other Topics Concern  . Not on file   Social History Narrative  . No narrative on file    FAMILY HISTORY: No family history on file.  ALLERGIES:  has No Known Allergies.  MEDICATIONS:  Current Outpatient Prescriptions  Medication Sig Dispense Refill  . hydrochlorothiazide (HYDRODIURIL) 25 MG tablet Take 25 mg by mouth daily.    . valsartan (DIOVAN) 160 MG tablet Take 160 mg by mouth daily.    Marland Kitchen HYDROcodone-acetaminophen (NORCO/VICODIN) 5-325 MG tablet Take 1-2 tablets by mouth every 4 (four) hours as needed for moderate pain or severe pain. (Patient not taking: Reported on 11/23/2015) 15 tablet 0   No current facility-administered medications for this visit.     REVIEW OF SYSTEMS:   Constitutional: Denies fevers, chills or abnormal night sweats Eyes: Denies blurriness of vision, double vision or watery eyes Ears, nose, mouth, throat, and face: Denies mucositis or sore throat Respiratory: Denies cough, dyspnea or wheezes Cardiovascular: Denies palpitation, chest discomfort or lower extremity swelling Gastrointestinal:  Denies nausea, heartburn or change in bowel habits Skin: Denies abnormal skin rashes Lymphatics: Denies new lymphadenopathy or easy bruising Neurological:Denies numbness, tingling or new weaknesses Behavioral/Psych: Mood is stable, no new changes  All other systems were reviewed with the patient and are negative.  PHYSICAL EXAMINATION: ECOG PERFORMANCE STATUS: 1 - Symptomatic but completely ambulatory  Vitals:   11/23/15 1420  BP: (!) 127/56  Pulse: 76  Resp: 18  Temp: 98.5 F (36.9 C)   Filed Weights   11/23/15 1420  Weight: 266 lb 12.8 oz (121 kg)    GENERAL:alert, no distress and comfortable SKIN: skin color, texture, turgor are normal, no rashes or significant lesions EYES: normal, conjunctiva are pink and non-injected, sclera clear OROPHARYNX:no exudate, no erythema and lips,  buccal mucosa, and tongue normal  NECK: supple, thyroid normal size, non-tender, without nodularity LYMPH:  no palpable lymphadenopathy in the cervical, axillary or inguinal LUNGS: clear to auscultation and percussion with normal breathing effort HEART: regular rate & rhythm and no murmurs and no lower extremity edema ABDOMEN:abdomen soft, non-tender and normal bowel sounds Musculoskeletal:no cyanosis of digits and no clubbing  PSYCH: alert & oriented x 3 with fluent speech NEURO: no focal motor/sensory deficits Breasts: Breast inspection showed them to be symmetrical with no nipple discharge. The incision in the left breast is healing well, no discharge or skin erythema. Palpation of the breasts and axilla revealed no obvious mass that I could appreciate.  LABORATORY DATA:  I have reviewed the data as listed No flowsheet data found.   CMP Latest Ref Rng & Units 11/04/2015  Glucose 65 - 99 mg/dL 92  BUN 6 - 20 mg/dL 10  Creatinine 0.44 - 1.00 mg/dL 0.67  Sodium 135 - 145 mmol/L 140  Potassium 3.5 - 5.1 mmol/L 4.6  Chloride 101 - 111 mmol/L 105  CO2 22 -  32 mmol/L 29  Calcium 8.9 - 10.3 mg/dL 9.5    PATHOLOGY REPORT Diagnosis 11/16/2015    1. Breast, lumpectomy, Left w/seed - INVASIVE GRADE 3 DUCTAL CARCINOMA, SPANNING 2.2 CM IN GREATEST DIMENSION. 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 - ASSOCIATED PROMINENT LYMPHOID RESPONSE. - INVASIVE DUCTAL CARCINOMA IS FOCALLY 0.1 TO 0.2 CM AWAY FROM MEDIAL MARGIN BUT MARGINAL SURFACE IS NEGATIVE FOR INVASIVE TUMOR. - DUCTAL CARCINOMA IN SITU IS FOCALLY 0.1 CM AWAY FROM LATERAL MARGIN - OTHER MARGINS ARE NEGATIVE. - SEE ONCOLOGY TEMPLATE. 2. Lymph node, sentinel, biopsy, Left Axillary - ONE BENIGN LYMPH NODE WITH NO TUMOR SEEN (0/1). Microscopic Comment 1. BREAST, INVASIVE TUMOR, WITH LYMPH NODES PRESENT Specimen, including laterality and lymph node sampling (sentinel, non-sentinel): Left partial breast with left  axillary sentinel lymph node. Procedure: Left breast lumpectomy with left axillary sentinel lymph node biopsy. Histologic type: Invasive ductal carcinoma. Grade: 3. Tubule formation: 3. Nuclear pleomorphism: 2. Mitotic: 3. Tumor size (gross measurement): 2.2 cm. Margins: Invasive, distance to closest margin: Invasive ductal carcinoma is focally 0.1 to 0.2 cm away from medial margin. In-situ, distance to closest margin: Ductal carcinoma in situ is focally 0.1 cm from lateral margin. If margin positive, focally or broadly: Marginal surface is not positive for tumor. Lymphovascular invasion: Definitive lymph/vascular invasion is not identified. Ductal carcinoma in situ: Yes. Grade: Intermediate grade. Extensive intraductal component: No. Lobular neoplasia: Not identified. Tumor focality: Unifocal. Extent of tumor: Tumor confined to breast parenchyma. Skin: Not received. Nipple: Not received. Skeletal muscle: Not received. Lymph nodes: Examined: 1 Sentinel. 0 Non-sentinel. 1 Total. Lymph nodes with metastasis: 0. Isolated tumor cells (< 0.2 mm): 0. Micrometastasis: (> 0.2 mm and < 2.0 mm): 0. Macrometastasis: (> 2.0 mm): 0. Extracapsular extension: Not applicable. Breast prognostic profile: Assessed on previous biopsy ([id number]): Estrogen receptor: 100%, positive. Progesterone receptor: 90%, positive. Her 2 neu: 2+ equivocal staining on immunohistochemical stain. FISH is pending on biopsy, per previous biospy report. Ki-67: 30%. Non-neoplastic breast: Fibrocystic changes. TNM: pT2, pN0. (RH:kh 11-18-15)  Diagnosis 10/27/2015 Consult Slide , Left Breast Biopsy - INVASIVE DUCTAL CARCINOMA, SEE COMMENT. - DUCTAL CARCINOMA IN SITU. Microscopic Comment While grading is best performed on the resection specimen the carcinoma appears grade 3. E-cadherin is positive confirming a ductal phenotype. Beta-catenin is positive. p53 exhibits weak staining. ER: Positive, 100%, strong  staining intensity. PR: Positive, 90%, strong staining intensity. Ki-67: 30% Her2 IHC: 2+ Equivocal, FISH (-), with average copy number of 2, and a ratio of 1.07.  RADIOGRAPHIC STUDIES: I have personally reviewed the radiological images as listed and agreed with the findings in the report. Nm Sentinel Node Inj-no Rpt (breast)  Result Date: 11/16/2015 CLINICAL DATA: cancer left breast Sulfur colloid was injected intradermally by the nuclear medicine technologist for breast cancer sentinel node localization.   Mm Breast Surgical Specimen  Result Date: 11/16/2015 CLINICAL DATA:  Left lumpectomy for infiltrating ductal carcinoma. EXAM: SPECIMEN RADIOGRAPH OF THE LEFT BREAST COMPARISON:  Previous exam(s). FINDINGS: Status post excision of the left breast. The radioactive seed and biopsy marker clip are present, completely intact, and were marked for pathology. A small, poorly defined mass is also noted in the center of the specimen. IMPRESSION: Specimen radiograph of the left breast. Electronically Signed   By: Claudie Revering M.D.   On: 11/16/2015 12:34   Mm Lt Radioactive Seed Loc Mammo Guide  Result Date: 11/15/2015 CLINICAL DATA:  Patient presents for radioactive seed localization prior to left lumpectomy. Ultrasound-guided core biopsy  performed in [place], [place] on 10/27/2015 demonstrated infiltrating ductal carcinoma in the 2 o'clock location of the left breast. EXAM: MAMMOGRAPHIC GUIDED RADIOACTIVE SEED LOCALIZATION OF THE LEFT BREAST COMPARISON:  Prior outside films from [place]: Patient presents for radioactive seed localization prior to lumpectomy. I met with the patient and we discussed the procedure of seed localization including benefits and alternatives. We discussed the high likelihood of a successful procedure. We discussed the risks of the procedure including infection, bleeding, tissue injury and further surgery. We discussed the low dose of radioactivity involved in the  procedure. Informed, written consent was given. The usual time-out protocol was performed immediately prior to the procedure. Using mammographic guidance, sterile technique, 1% lidocaine and an I-125 radioactive seed, mass and tissue marker clip in the lateral portion of the left breast were localized using a lateral approach. The follow-up mammogram images confirm the seed in the expected location and were marked for Dr. Excell Seltzer. Follow-up survey of the patient confirms presence of the radioactive seed. Order number of I-125 seed:  [id number]. Total activity:  0.248  Reference Date: 10/06/2015 The patient tolerated the procedure well and was released from the [facility]. She was given instructions regarding seed removal. IMPRESSION: Radioactive seed localization left breast. No apparent complications. Electronically Signed   By: Nolon Nations M.D.   On: 11/15/2015 14:44    ASSESSMENT & PLAN: 55 year old Caucasian post menopause female, presented with screening discovered left breast cancer  1. 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 -We discussed the cancer recurrence after surgery. Given her grade 3 disease, I'm concerned she may have high risk disease. -I recommend a Oncotype Dx test on the surgical sample and we'll make a decision about adjuvant chemotherapy based on the Oncotype result. Written material of this test was given to her. She is young and fit, would be a good candidate for chemotherapy if her Oncotype recurrence score is high. Chemotherapywill not be offered if she has low risk disease. -Given the strong ER and PR positivity, I do recommend adjuvant aromatase inhibitor to reduce her risk of cancer recurrence,  The potential benefit and side effects, which includes but  not limited to, hot flash, skin and vaginal dryness, metabolic changes ( increased blood glucose, cholesterol, weight, etc.), slightly in increased risk of cardiovascular disease, cataracts, muscular and joint discomfort, osteopenia and osteoporosis, etc, were discussed with her in great details. She is interested, and we'll start after she completes radiation. -She is scheduled to see radiation oncologist Dr. Tammi Klippel on 11/25/2015. She will likely benefit from adjuvant breast radiation. -We also discussed the breast cancer surveillance after her surgery. She will continue annual screening mammogram, self exam, and a routine office visit with lab and exam with [place]. -I encouraged her to have healthy diet and exercise regularly.   PLAN -We will send out Oncotype Dx on her surgical sample, to determine about adjuvant chemotherapy -I'll plan to see her back in 3 weeks to review his Oncotype test result.   All questions were answered. The patient knows to call the clinic with any problems, questions or concerns. I spent 55 minutes counseling the patient face to face. The total time spent in the appointment was 60 minutes and more than 50% was on counseling.     [name], MD 11/23/2015 2:39 PM

## 2015-11-24 ENCOUNTER — Telehealth: Payer: Self-pay | Admitting: *Deleted

## 2015-11-24 NOTE — Telephone Encounter (Signed)
Ordered oncotype per Dr. Burr Medico request.  Faxed requisition to pathology and confirmed receipt. Faxed PA to [facility]

## 2015-11-25 ENCOUNTER — Ambulatory Visit
Admission: RE | Admit: 2015-11-25 | Discharge: 2015-11-25 | Disposition: A | Discharge status: Home / Self Care | Payer: BLUE CROSS/BLUE SHIELD | Source: Ambulatory Visit | Attending: Radiation Oncology | Admitting: Radiation Oncology

## 2015-11-25 ENCOUNTER — Encounter: Payer: Self-pay | Admitting: Radiation Oncology

## 2015-11-25 ENCOUNTER — Encounter: Payer: Self-pay | Admitting: Hematology

## 2015-11-25 VITALS — BP 128/91 | HR 83 | Resp 18 | Ht 65.0 in | Wt 267.8 lb

## 2015-11-25 DIAGNOSIS — Z79899 Other long term (current) drug therapy: Secondary | ICD-10-CM | POA: Diagnosis not present

## 2015-11-25 DIAGNOSIS — C50412 Malignant neoplasm of upper-outer quadrant of left female breast: Secondary | ICD-10-CM | POA: Insufficient documentation

## 2015-11-25 DIAGNOSIS — I10 Essential (primary) hypertension: Secondary | ICD-10-CM | POA: Insufficient documentation

## 2015-11-25 DIAGNOSIS — Z8541 Personal history of malignant neoplasm of cervix uteri: Secondary | ICD-10-CM | POA: Diagnosis not present

## 2015-11-25 HISTORY — DX: Malignant neoplasm of unspecified site of unspecified female breast: C50.919

## 2015-11-25 NOTE — Progress Notes (Signed)
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 Breast cancer of upper-outer quadrant of left female breast ([place]) 174.4 C50.412     HISTORY OF PRESENT ILLNESS::Nicole Schmitt is a 55 y.o. female who had an abnormal screening mammogram. She underwent a left breast lumpectomy with seed.. Her disease was categorized as invasive grade 3 ductal carcinoma, spanning 2.2 cm in the greatest dimension, associated with intermediate grade ductal carcinoma in situ, associated with prominent lymphoid response. Invasive ductal carcinoma is focally 0.1 to 0.2 cm away from the medial margin but marginal surface is negative for invasive tumor. Ductal carcinoma in situ is focally 0.1 cm away from lateral margin. Other margins are negative. Additionally, patient had left axillary sentinel lymph node biopsy. There was one benign lymph node with no tumor seen. Her disease is ER (100%), PR (90%), Her2(-), Ki(30%). Patient saw Dr. Burr Medico on 11/24/15; he ordered oncotype testing.  Patient presents with left breast incision healing well without redness, edema, or drainage. Scant bloody drainage from left axilla/lymph node incision.   PREVIOUS RADIATION THERAPY: No  Past Medical History:  Diagnosis Date  . Breast cancer ([place])   . Cancer ([place]) 1990   cervical   . Hypertension   . Seizures ([place])    > 20 years following fall from horse  :   Past Surgical History:  Procedure Laterality Date  . BREAST LUMPECTOMY WITH RADIOACTIVE SEED AND SENTINEL LYMPH NODE BIOPSY Left 11/16/2015   Procedure: BREAST LUMPECTOMY WITH RADIOACTIVE SEED AND SENTINEL LYMPH NODE BIOPSY;  Surgeon: Excell Seltzer, MD;  Location: [facility];  Service: General;  Laterality: Left;  . CESAREAN SECTION    . COLOSTOMY    . WISDOM TOOTH EXTRACTION    :   Current Outpatient Prescriptions:  .  CLINDAGEL 1 % gel, Apply 1 application topically daily. , Disp: , Rfl:  .  hydrochlorothiazide (HYDRODIURIL) 25 MG tablet, Take 25 mg by mouth daily. 12.5 mg to 25 mg dependent on bp medication, Disp: , Rfl:  .  valsartan (DIOVAN) 160 MG tablet, Take 160 mg by mouth daily., Disp: , Rfl:  .  doxycycline (VIBRAMYCIN) 100 MG capsule, Take 100 mg by mouth daily as needed. , Disp: , Rfl:  .  HYDROcodone-acetaminophen (NORCO/VICODIN) 5-325 MG tablet, Take 1-2 tablets by mouth every 4 (four) hours as needed for moderate pain or severe pain. (Patient not taking: Reported on 11/25/2015), Disp: 15 tablet, Rfl: 0:  No Known Allergies:   Family History  Problem Relation Age of Onset  . Cancer Maternal Uncle     liver cancer/environmental exposure  . Cancer Paternal Uncle     liver cancer/environmental exposure  :   Social History   Social History  . Marital status: Married    Spouse name: N/A  . Number of children: N/A  . Years of education: N/A   Occupational History  . Not on file.   Social History Main Topics  . Smoking status: Never Smoker  . Smokeless tobacco: Never Used  . Alcohol use Yes     Comment: rare  . Drug use: No  . Sexual activity: Yes  Other Topics Concern  . Not on file   Social History Narrative  . No narrative on file  :  REVIEW OF SYSTEMS:  A 15 point review of systems is documented in the electronic medical record. This was obtained by the nursing staff. However, I reviewed this with the patient to discuss relevant findings and make appropriate changes.  A comprehensive review of systems was negative.   Patient denies any lymphedema issues or pain at this time.  PHYSICAL EXAM:  Blood pressure (!) 128/91, pulse 83, resp. rate 18, height '5\' 5"'  (1.651 m),  weight 267 lb 12.8 oz (121.5 kg), SpO2 100 %. per med-onc  GENERAL:alert, no distress and comfortable SKIN: skin color, texture, turgor are normal, no rashes or significant lesions EYES: normal, conjunctiva are pink and non-injected, sclera clear OROPHARYNX:no exudate, no erythema and lips, buccal mucosa, and tongue normal  NECK: supple, thyroid normal size, non-tender, without nodularity LYMPH:  no palpable lymphadenopathy in the cervical, axillary or inguinal LUNGS: clear to auscultation and percussion with normal breathing effort HEART: regular rate & rhythm and no murmurs and no lower extremity edema ABDOMEN:abdomen soft, non-tender and normal bowel sounds Musculoskeletal:no cyanosis of digits and no clubbing  PSYCH: alert & oriented x 3 with fluent speech NEURO: no focal motor/sensory deficits Breasts: Breast inspection showed them to be symmetrical with no nipple discharge. The incision in the left breast is healing well, no discharge or skin erythema. Palpation of the breasts and axilla revealed no obvious mass that I could appreciate.  KPS = 100  100 - Normal; no complaints; no evidence of disease. 90   - Able to carry on normal activity; minor signs or symptoms of disease. 80   - Normal activity with effort; some signs or symptoms of disease. 7   - Cares for self; unable to carry on normal activity or to do active work. 60   - Requires occasional assistance, but is able to care for most of his personal needs. 50   - Requires considerable assistance and frequent medical care. 4   - Disabled; requires special care and assistance. 55   - Severely disabled; hospital admission is indicated although death not imminent. 29   - Very sick; hospital admission necessary; active supportive treatment necessary. 10   - Moribund; fatal processes progressing rapidly. 0     - Dead  Karnofsky DA, Abelmann [place], Craver LS and [facility] [phone]) The use of the nitrogen mustards in the palliative  treatment of carcinoma: with particular reference to bronchogenic carcinoma Cancer 1 634-56  LABORATORY DATA:  No results found for: WBC, HGB, HCT, MCV, PLT Lab Results  Component Value Date   NA 140 11/04/2015   K 4.6 11/04/2015   CL 105 11/04/2015   CO2 29 11/04/2015   No results found for: ALT, AST, GGT, ALKPHOS, BILITOT   RADIOGRAPHY: Nm Sentinel Node Inj-no Rpt (breast)  Result Date: 11/16/2015 CLINICAL DATA: cancer left breast Sulfur colloid was injected intradermally by the nuclear medicine technologist for breast cancer sentinel node localization.   Mm Breast Surgical Specimen  Result Date: 11/16/2015 CLINICAL DATA:  Left lumpectomy for infiltrating ductal carcinoma. EXAM: SPECIMEN RADIOGRAPH OF THE LEFT BREAST COMPARISON:  Previous exam(s). FINDINGS: Status post excision of the left breast. The radioactive seed and biopsy marker clip are present, completely intact, and were marked for pathology. A small, poorly defined mass is also noted in the center of the specimen. IMPRESSION: Specimen radiograph of the left breast. Electronically Signed  By: Claudie Revering M.D.   On: 11/16/2015 12:34   Mm Lt Radioactive Seed Loc Mammo Guide  Result Date: 11/15/2015 CLINICAL DATA:  Patient presents for radioactive seed localization prior to left lumpectomy. Ultrasound-guided core biopsy performed in [place], [place] on 10/27/2015 demonstrated infiltrating ductal carcinoma in the 2 o'clock location of the left breast. EXAM: MAMMOGRAPHIC GUIDED RADIOACTIVE SEED LOCALIZATION OF THE LEFT BREAST COMPARISON:  Prior outside films from [place]: Patient presents for radioactive seed localization prior to lumpectomy. I met with the patient and we discussed the procedure of seed localization including benefits and alternatives. We discussed the high likelihood of a successful procedure. We discussed the risks of the procedure including infection, bleeding, tissue injury and further surgery. We  discussed the low dose of radioactivity involved in the procedure. Informed, written consent was given. The usual time-out protocol was performed immediately prior to the procedure. Using mammographic guidance, sterile technique, 1% lidocaine and an I-125 radioactive seed, mass and tissue marker clip in the lateral portion of the left breast were localized using a lateral approach. The follow-up mammogram images confirm the seed in the expected location and were marked for Dr. Excell Seltzer. Follow-up survey of the patient confirms presence of the radioactive seed. Order number of I-125 seed:  [id number]. Total activity:  0.248  Reference Date: 10/06/2015 The patient tolerated the procedure well and was released from the [facility]. She was given instructions regarding seed removal. IMPRESSION: Radioactive seed localization left breast. No apparent complications. Electronically Signed   By: Nolon Nations M.D.   On: 11/15/2015 14:44      IMPRESSION:  55 yo woman with stage T2 N0 M0 ER+ Invasive Ductal Carcinoma of the upper outer left breast - Stage IIA  PLAN: Today, I talked to the patient and family about the findings and work-up thus far.  We discussed the natural history of invasive ductal carcinoma of the breast and general treatment, highlighting the role of radiotherapy in the management.  We discussed the available radiation techniques, and focused on the details of logistics and delivery.  We reviewed the anticipated acute and late sequelae associated with radiation in this setting.  The patient was encouraged to ask questions that I answered to the best of my ability.  I filled out a patient counseling form during our discussion including treatment diagrams.  We retained a copy for our records.  The patient would like to proceed with radiation and will be scheduled for CT simulation.  I spent 60 minutes face to face with the patient and more than 50% of that time was spent in counseling and/or  coordination of care.    ------------------------------------------------   Tyler Pita, MD [place] Director and Director of Stereotactic Radiosurgery Direct Dial: [phone]  Fax: [phone] [email or web address]  Skype  [email or web address]  This document serves as a record of services personally performed by Tyler Pita, MD. It was created on his behalf by Bethann Humble, a trained medical scribe. The creation of this record is based on the scribe's personal observations and the provider's statements to them. This document has been checked and approved by the attending provider.

## 2015-11-25 NOTE — Progress Notes (Signed)
See progress note under physician encounter. 

## 2015-11-25 NOTE — Progress Notes (Signed)
Location of Breast Cancer: Stage IIA left breast cancer of upper outer quadrant  Histology per Pathology Report:    Receptor Status: ER(100%), PR (90%), Her2-neu (2+ equivocal staining on immunohistochemical stain. FISH pending), Ki-(30%)  Did patient present with symptoms (if so, please note symptoms) or was this found on screening mammography?: screening mammogram  Past/Anticipated interventions by surgeon, if HUD:[facility] breast lumpectomy with radioactive seed and sentinel lymph node biopsy  Past/Anticipated interventions by medical oncology, if any: consulted by Dr. Burr Medico 11/24/15 (office note incomplete). Dr. Burr Medico ordered oncotype testing.  Lymphedema issues, if any:  no  Pain issues, if any:  Tenderness left axilla.  SAFETY ISSUES:  Prior radiation? no  Pacemaker/ICD? no  Possible current pregnancy?no  Is the patient on methotrexate? no  Current Complaints / other details:  55 year old female. Married with a 77 year old. ICU nurse at [employer]. Lives in [place]. Husband has metastatic melanoma-receiving tx at [facility]. Father has cancer. No family hx of breast cancer. Left breast incision healing well without redness, edema or drainage. Scant bloody drainage from left axilla/lymph node incision.   Age at menarche:  72 years Age of menopause: 51-55 Contraceptive History: Yes Gravida 1 Para 1    Nicole Foutz, RN 11/25/2015,10:24 AM

## 2015-11-28 ENCOUNTER — Telehealth: Payer: Self-pay | Admitting: Hematology

## 2015-11-28 NOTE — Telephone Encounter (Signed)
Lvm advising appt 10/16 @ 3pm.

## 2015-12-07 ENCOUNTER — Encounter (HOSPITAL_COMMUNITY): Payer: Self-pay

## 2015-12-07 ENCOUNTER — Telehealth: Payer: Self-pay | Admitting: *Deleted

## 2015-12-07 NOTE — Telephone Encounter (Signed)
Received Oncotype Score of 23/15%. Physician team notified

## 2015-12-13 ENCOUNTER — Ambulatory Visit (HOSPITAL_BASED_OUTPATIENT_CLINIC_OR_DEPARTMENT_OTHER): Payer: BLUE CROSS/BLUE SHIELD | Admitting: Hematology

## 2015-12-13 ENCOUNTER — Telehealth: Payer: Self-pay | Admitting: Hematology

## 2015-12-13 VITALS — BP 123/68 | HR 86 | Temp 98.2°F | Resp 18 | Ht 65.0 in | Wt 272.8 lb

## 2015-12-13 DIAGNOSIS — C50412 Malignant neoplasm of upper-outer quadrant of left female breast: Secondary | ICD-10-CM | POA: Diagnosis not present

## 2015-12-13 DIAGNOSIS — Z170 Estrogen receptor positive status [ER+]: Secondary | ICD-10-CM

## 2015-12-13 DIAGNOSIS — Z806 Family history of leukemia: Secondary | ICD-10-CM | POA: Diagnosis not present

## 2015-12-13 DIAGNOSIS — I10 Essential (primary) hypertension: Secondary | ICD-10-CM | POA: Diagnosis not present

## 2015-12-13 NOTE — Progress Notes (Signed)
[place]  Telephone:([phone]) [phone] Fax:(336) [phone]  Clinic Follow up Note   Patient Care Team: Pcp Not In System as PCP - General 12/13/2015   CHIEF COMPLAINTS:  Follow up left breast cancer  Oncology History   Breast cancer of upper-outer quadrant of left female breast [facility])   Staging form: Breast, AJCC 7th Edition   - Pathologic: Stage IIA (T2, N0, cM0) - Unsigned      Breast cancer of upper-outer quadrant of left female breast ([place])   10/20/2015 Mammogram    Screening mammogram showed new nodular density on the left breast, measuring about 2 cm. the well defined nodules bilaterally are otherwise stable.      10/27/2015 Initial Biopsy    Left breast 2:00 position mass biopsy showed infiltrating ductal carcinoma.       10/27/2015 Initial Diagnosis    Breast cancer of upper-outer quadrant of left female breast ([place])      10/27/2015 Receptors her2    ER 100% positive, strong staining, PR 90% positive, strong staining, HER-2 IHC 2+, FISH negative.      11/16/2015 Surgery    Left breast lumpectomy and sentinel lymph node biopsy.      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 Margins were negative. One sentinel lymph node was negative. Lymphovascular invasion was negative.      11/16/2015 Oncotype testing    RS 23, intermediate risk, which predicts 10 year risk of distant recurrence 15% with tamoxifen       HISTORY OF PRESENTING ILLNESS:  Nicole Schmitt 55 y.o. female is here because of Her recently resected left breast cancer. She presents to the clinic by herself. She was referred by her surgeon Dr. Excell Seltzer.  She lives just outside of [place] but works as an Therapist, sports on the Advertising account planner at [employer]. Her breast cancer was discovered by screening mammogram, and her initial workup was done in [place].She recently underwent a screening mamogram which revealed a  possible mass in the left breast. Subsequent imaging included diagnostic mamogram showing a persistent area of asymmetric density in the left breast and ultrasound showing a 1.8 x 0.7 cm lobulated mass at the 2 o'clock position in the left breast 6 cm from the nipple. An ultrasound guided breast biopsy was performed on October 27, 2015 with pathology revealing invasive ductal carcinoma of the breast. She was referred to (surgeon Dr. Excell Seltzer, and underwent left breast lumpectomy and sentinel lymph node biopsy on 11/16/2015. The surgical pathology showed invasive grade 3 ductal carcinoma, 2.2 cm, one lymph node was negative.   She has been recovering well from her surgery. The pain to this surgical incision is minimal, she does not take much pain medication. No limited range of movement of left shoulder. Her appetite and energy level has been back to normal, she denies any other symptoms.  Her husband was diagnosed with metastatic melanoma in early 2016, has been receiving immunotherapy at [facility]. Her father was also recently diagnosed with CLL, but does not quite treatment. She has been under lot of stress in the past few years.  CURRENT THERAPY: pending   INTERIM HISTORY: Nicole Schmitt returns for follow-up and discuss Oncotype results. She is doing well, no complaints.  MEDICAL HISTORY:  Past Medical History:  Diagnosis Date  . Breast cancer ([place])   . Cancer ([place]) 1990   cervical   . Hypertension   . Seizures ([facility])    >  20 years following fall from horse    SURGICAL HISTORY: Past Surgical History:  Procedure Laterality Date  . BREAST LUMPECTOMY WITH RADIOACTIVE SEED AND SENTINEL LYMPH NODE BIOPSY Left 11/16/2015   Procedure: BREAST LUMPECTOMY WITH RADIOACTIVE SEED AND SENTINEL LYMPH NODE BIOPSY;  Surgeon: Excell Seltzer, MD;  Location: [facility];  Service: General;  Laterality: Left;  . CESAREAN SECTION    . COLOSTOMY    . WISDOM TOOTH EXTRACTION      SOCIAL  HISTORY: Social History   Social History  . Marital status: Married    Spouse name: N/A  . Number of children: N/A  . Years of education: N/A   Occupational History  . Not on file.   Social History Main Topics  . Smoking status: Never Smoker  . Smokeless tobacco: Never Used  . Alcohol use Yes     Comment: rare  . Drug use: No  . Sexual activity: Yes   Other Topics Concern  . Not on file   Social History Narrative  . No narrative on file    FAMILY HISTORY: Family History  Problem Relation Age of Onset  . Cancer Maternal Uncle     liver cancer/environmental exposure  . Cancer Paternal Uncle     liver cancer/environmental exposure    ALLERGIES:  has No Known Allergies.  MEDICATIONS:  Current Outpatient Prescriptions  Medication Sig Dispense Refill  . CLINDAGEL 1 % gel Apply 1 application topically daily.     Marland Kitchen doxycycline (VIBRAMYCIN) 100 MG capsule Take 100 mg by mouth daily as needed.     . hydrochlorothiazide (HYDRODIURIL) 25 MG tablet Take 25 mg by mouth daily. 12.5 mg to 25 mg dependent on bp medication    . HYDROcodone-acetaminophen (NORCO/VICODIN) 5-325 MG tablet Take 1-2 tablets by mouth every 4 (four) hours as needed for moderate pain or severe pain. (Patient not taking: Reported on 11/25/2015) 15 tablet 0  . valsartan (DIOVAN) 160 MG tablet Take 160 mg by mouth daily.     No current facility-administered medications for this visit.     REVIEW OF SYSTEMS:   Constitutional: Denies fevers, chills or abnormal night sweats Eyes: Denies blurriness of vision, double vision or watery eyes Ears, nose, mouth, throat, and face: Denies mucositis or sore throat Respiratory: Denies cough, dyspnea or wheezes Cardiovascular: Denies palpitation, chest discomfort or lower extremity swelling Gastrointestinal:  Denies nausea, heartburn or change in bowel habits Skin: Denies abnormal skin rashes Lymphatics: Denies new lymphadenopathy or easy bruising Neurological:Denies  numbness, tingling or new weaknesses Behavioral/Psych: Mood is stable, no new changes  All other systems were reviewed with the patient and are negative.  PHYSICAL EXAMINATION: ECOG PERFORMANCE STATUS: 1 - Symptomatic but completely ambulatory  Vitals:   12/13/15 1501  BP: 123/68  Pulse: 86  Resp: 18  Temp: 98.2 F (36.8 C)   Filed Weights   12/13/15 1501  Weight: 272 lb 12.8 oz (123.7 kg)    GENERAL:alert, no distress and comfortable SKIN: skin color, texture, turgor are normal, no rashes or significant lesions EYES: normal, conjunctiva are pink and non-injected, sclera clear OROPHARYNX:no exudate, no erythema and lips, buccal mucosa, and tongue normal  NECK: supple, thyroid normal size, non-tender, without nodularity LYMPH:  no palpable lymphadenopathy in the cervical, axillary or inguinal LUNGS: clear to auscultation and percussion with normal breathing effort HEART: regular rate & rhythm and no murmurs and no lower extremity edema ABDOMEN:abdomen soft, non-tender and normal bowel sounds Musculoskeletal:no cyanosis of digits and no  clubbing  PSYCH: alert & oriented x 3 with fluent speech NEURO: no focal motor/sensory deficits Breasts: Breast inspection showed them to be symmetrical with no nipple discharge. The incision in the left breast has healed well, no discharge or skin erythema. Palpation of the breasts and axilla revealed no obvious mass that I could appreciate.  LABORATORY DATA:  I have reviewed the data as listed No flowsheet data found.   CMP Latest Ref Rng & Units 11/04/2015  Glucose 65 - 99 mg/dL 92  BUN 6 - 20 mg/dL 10  Creatinine 0.44 - 1.00 mg/dL 0.67  Sodium 135 - 145 mmol/L 140  Potassium 3.5 - 5.1 mmol/L 4.6  Chloride 101 - 111 mmol/L 105  CO2 22 - 32 mmol/L 29  Calcium 8.9 - 10.3 mg/dL 9.5    PATHOLOGY REPORT Diagnosis 11/16/2015    1. Breast, lumpectomy, Left w/seed - INVASIVE GRADE 3 DUCTAL CARCINOMA, SPANNING 2.2 CM IN GREATEST DIMENSION. -  ASSOCIATED INTERMEDIATE GRADE DUCTAL CARCINOMA IN SITU. - ASSOCIATED PROMINENT LYMPHOID RESPONSE. - INVASIVE DUCTAL CARCINOMA IS FOCALLY 0.1 TO 0.2 CM AWAY FROM MEDIAL MARGIN BUT MARGINAL SURFACE IS NEGATIVE FOR INVASIVE TUMOR. - DUCTAL CARCINOMA IN SITU IS FOCALLY 0.1 CM AWAY FROM LATERAL MARGIN - OTHER MARGINS ARE NEGATIVE. - SEE ONCOLOGY TEMPLATE. 2. Lymph node, sentinel, biopsy, Left Axillary - ONE BENIGN LYMPH NODE WITH NO TUMOR SEEN (0/1). Microscopic Comment 1. BREAST, INVASIVE TUMOR, WITH LYMPH NODES PRESENT Specimen, including laterality and lymph node sampling (sentinel, non-sentinel): Left partial breast with left axillary sentinel lymph node. Procedure: Left breast lumpectomy with left axillary sentinel lymph node biopsy. Histologic type: Invasive ductal carcinoma. Grade: 3. Tubule formation: 3. Nuclear pleomorphism: 2. Mitotic: 3. Tumor size (gross measurement): 2.2 cm. Margins: Invasive, distance to closest margin: Invasive ductal carcinoma is focally 0.1 to 0.2 cm away from medial margin. In-situ, distance to closest margin: Ductal carcinoma in situ is focally 0.1 cm from lateral margin. If margin positive, focally or broadly: Marginal surface is not positive for tumor. Lymphovascular invasion: Definitive lymph/vascular invasion is not identified. Ductal carcinoma in situ: Yes. Grade: Intermediate grade. Extensive intraductal component: No. Lobular neoplasia: Not identified. Tumor focality: Unifocal. Extent of tumor: Tumor confined to breast parenchyma. Skin: Not received. Nipple: Not received. Skeletal muscle: Not received. Lymph nodes: Examined: 1 Sentinel. 0 Non-sentinel. 1 Total. Lymph nodes with metastasis: 0. Isolated tumor cells (< 0.2 mm): 0. Micrometastasis: (> 0.2 mm and < 2.0 mm): 0. Macrometastasis: (> 2.0 mm): 0. Extracapsular extension: Not applicable. Breast prognostic profile: Assessed on previous biopsy ([id number]): Estrogen receptor:  100%, positive. Progesterone receptor: 90%, positive. Her 2 neu: 2+ equivocal staining on immunohistochemical stain. FISH is pending on biopsy, per previous biospy report. Ki-67: 30%. Non-neoplastic breast: Fibrocystic changes. TNM: pT2, pN0. (RH:kh 11-18-15)  Diagnosis 10/27/2015 Consult Slide , Left Breast Biopsy - INVASIVE DUCTAL CARCINOMA, SEE COMMENT. - DUCTAL CARCINOMA IN SITU. Microscopic Comment While grading is best performed on the resection specimen the carcinoma appears grade 3. E-cadherin is positive confirming a ductal phenotype. Beta-catenin is positive. p53 exhibits weak staining. ER: Positive, 100%, strong staining intensity. PR: Positive, 90%, strong staining intensity. Ki-67: 30% Her2 IHC: 2+ Equivocal, FISH (-), with average copy number of 2, and a ratio of 1.07.  Oncotype RS 23, intermediate risk, which predicts 10 year risk of distant recurrence 15% with tamoxifen  RADIOGRAPHIC STUDIES: I have personally reviewed the radiological images as listed and agreed with the findings in the report. Nm Sentinel Node Inj-no Rpt (breast)  Result Date: 11/16/2015 CLINICAL DATA: cancer left breast Sulfur colloid was injected intradermally by the nuclear medicine technologist for breast cancer sentinel node localization.   Mm Breast Surgical Specimen  Result Date: 11/16/2015 CLINICAL DATA:  Left lumpectomy for infiltrating ductal carcinoma. EXAM: SPECIMEN RADIOGRAPH OF THE LEFT BREAST COMPARISON:  Previous exam(s). FINDINGS: Status post excision of the left breast. The radioactive seed and biopsy marker clip are present, completely intact, and were marked for pathology. A small, poorly defined mass is also noted in the center of the specimen. IMPRESSION: Specimen radiograph of the left breast. Electronically Signed   By: Claudie Revering M.D.   On: 11/16/2015 12:34   Mm Lt Radioactive Seed Loc Mammo Guide  Result Date: 11/15/2015 CLINICAL DATA:  Patient presents for radioactive  seed localization prior to left lumpectomy. Ultrasound-guided core biopsy performed in [place], [place] on 10/27/2015 demonstrated infiltrating ductal carcinoma in the 2 o'clock location of the left breast. EXAM: MAMMOGRAPHIC GUIDED RADIOACTIVE SEED LOCALIZATION OF THE LEFT BREAST COMPARISON:  Prior outside films from [place]: Patient presents for radioactive seed localization prior to lumpectomy. I met with the patient and we discussed the procedure of seed localization including benefits and alternatives. We discussed the high likelihood of a successful procedure. We discussed the risks of the procedure including infection, bleeding, tissue injury and further surgery. We discussed the low dose of radioactivity involved in the procedure. Informed, written consent was given. The usual time-out protocol was performed immediately prior to the procedure. Using mammographic guidance, sterile technique, 1% lidocaine and an I-125 radioactive seed, mass and tissue marker clip in the lateral portion of the left breast were localized using a lateral approach. The follow-up mammogram images confirm the seed in the expected location and were marked for Dr. Excell Seltzer. Follow-up survey of the patient confirms presence of the radioactive seed. Order number of I-125 seed:  [id number]. Total activity:  0.248  Reference Date: 10/06/2015 The patient tolerated the procedure well and was released from the [facility]. She was given instructions regarding seed removal. IMPRESSION: Radioactive seed localization left breast. No apparent complications. Electronically Signed   By: Nolon Nations M.D.   On: 11/15/2015 14:44    ASSESSMENT & PLAN: 55 year old Caucasian post menopause female, presented with screening discovered left breast cancer  1. 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 -We discussed her Oncotype DX test results. The recurrence score is 23, which is intermedia risk, it protects 10 year of distant recurrence risk of 15% with tamoxifen. The benefit of adjuvant chemotherapy intermediate risk group is uncertain, however given her young age, grade 3 disease, I think adjuvant chemo will probably reduce her risk of recurrence although the benefit is not very high. She is very concerned about cancer recurrence. After lengthy discussion, we decided to proceed with adjuvant chemotherapy -Given her an nose negative disease, I recommend docetaxel and Cytoxan every 3 weeks for total 4 cycles. --Chemotherapy consent: Side effects including but does not not limited to, fatigue, nausea, vomiting, diarrhea, hair loss (including small risk of permanent hearing loss), neuropathy, fluid retention, renal and kidney dysfunction, neutropenic fever, needed for blood transfusion, bleeding, were discussed with patient in great detail. She agrees to proceed. -The goal of therapy is curative. -We plan to start her chemotherapy next week  2. HTN -She is on antihypertensive medication, including HCTZ -We discussed the impact of chemotherapy on her blood pressure. We'll monitor her blood pressure closely during the chemotherapy. We may need to hold her HCTZ if her BP drops after chemo due to decreased po intake.   PLAN -chemo class -first cycle TC next week on 10/26, I'll see her back on week after her chemotherapy.   All questions were answered. The patient knows to call the clinic with any problems, questions or concerns. I spent 25 minutes counseling the patient face to face. The total time spent in the appointment was 30 minutes and more than 50% was on counseling.     [name], MD 12/13/2015

## 2015-12-13 NOTE — Telephone Encounter (Signed)
Gave patient avs report and appointments for October and November.  °

## 2015-12-16 ENCOUNTER — Other Ambulatory Visit: Payer: Self-pay | Admitting: *Deleted

## 2015-12-16 ENCOUNTER — Other Ambulatory Visit: Payer: BLUE CROSS/BLUE SHIELD

## 2015-12-16 ENCOUNTER — Other Ambulatory Visit (HOSPITAL_BASED_OUTPATIENT_CLINIC_OR_DEPARTMENT_OTHER): Payer: BLUE CROSS/BLUE SHIELD

## 2015-12-16 ENCOUNTER — Encounter: Payer: Self-pay | Admitting: *Deleted

## 2015-12-16 DIAGNOSIS — C50412 Malignant neoplasm of upper-outer quadrant of left female breast: Secondary | ICD-10-CM

## 2015-12-16 LAB — COMPREHENSIVE METABOLIC PANEL
ALBUMIN: 3.3 g/dL — AB (ref 3.5–5.0)
ALK PHOS: 120 U/L (ref 40–150)
ALT: 14 U/L (ref 0–55)
ANION GAP: 8 meq/L (ref 3–11)
AST: 14 U/L (ref 5–34)
BILIRUBIN TOTAL: 0.33 mg/dL (ref 0.20–1.20)
BUN: 16.8 mg/dL (ref 7.0–26.0)
CO2: 28 mEq/L (ref 22–29)
CREATININE: 0.8 mg/dL (ref 0.6–1.1)
Calcium: 9.3 mg/dL (ref 8.4–10.4)
Chloride: 107 mEq/L (ref 98–109)
EGFR: 81 mL/min/{1.73_m2} — ABNORMAL LOW (ref >90)
GLUCOSE: 114 mg/dL (ref 70–140)
Potassium: 4.1 mEq/L (ref 3.5–5.1)
Sodium: 142 mEq/L (ref 136–145)
TOTAL PROTEIN: 7.2 g/dL (ref 6.4–8.3)

## 2015-12-16 LAB — CBC WITH DIFFERENTIAL/PLATELET
BASO%: 0.2 % (ref 0.0–2.0)
Basophils Absolute: 0 10*3/uL (ref 0.0–0.1)
EOS ABS: 0.2 10*3/uL (ref 0.0–0.5)
EOS%: 1.7 % (ref 0.0–7.0)
HCT: 40.7 % (ref 34.8–46.6)
HEMOGLOBIN: 13.2 g/dL (ref 11.6–15.9)
LYMPH%: 39.8 % (ref 14.0–49.7)
MCH: 28.6 pg (ref 25.1–34.0)
MCHC: 32.4 g/dL (ref 31.5–36.0)
MCV: 88.1 fL (ref 79.5–101.0)
MONO#: 0.6 10*3/uL (ref 0.1–0.9)
MONO%: 7.4 % (ref 0.0–14.0)
NEUT%: 50.9 % (ref 38.4–76.8)
NEUTROS ABS: 4.4 10*3/uL (ref 1.5–6.5)
PLATELETS: 297 10*3/uL (ref 145–400)
RBC: 4.62 10*6/uL (ref 3.70–5.45)
RDW: 14 % (ref 11.2–14.5)
WBC: 8.7 10*3/uL (ref 3.9–10.3)
lymph#: 3.5 10*3/uL — ABNORMAL HIGH (ref 0.9–3.3)

## 2015-12-18 ENCOUNTER — Encounter: Payer: Self-pay | Admitting: Hematology

## 2015-12-18 MED ORDER — PROCHLORPERAZINE MALEATE 10 MG PO TABS
10.0000 mg | ORAL_TABLET | Freq: Four times a day (QID) | ORAL | 1 refills | Status: DC | PRN
Start: 2015-12-18 — End: 2016-03-16

## 2015-12-18 MED ORDER — ONDANSETRON HCL 8 MG PO TABS: 8.0000 mg | ORAL_TABLET | Freq: Two times a day (BID) | ORAL | 1 refills | Status: DC | PRN

## 2015-12-18 MED ORDER — DEXAMETHASONE 4 MG PO TABS: 8.0000 mg | ORAL_TABLET | Freq: Two times a day (BID) | ORAL | 1 refills | Status: DC

## 2015-12-18 NOTE — Progress Notes (Signed)
START ON PATHWAY REGIMEN - Breast  [id number]: TC - Docetaxel, Cyclophosphamide q21 Days x 4 Cycles   A cycle is every 21 days:     Docetaxel (Taxotere(R)) 75 mg/m2 in 250 mL NS IV over 1 hour, q21 days; followed by Dose Mod: None     Cyclophosphamide (Cytoxan(R)) 600 mg/m2 in 250 mL NS IV over 30 minutes, q21 days Dose Mod: None Additional Orders: Premedicate with dexamethasone 8 mg PO bid for three days beginning 1 day prior to therapy  **Always confirm dose/schedule in your pharmacy ordering system**    Patient Characteristics: Adjuvant Therapy, Node Negative, HER2/neu Negative/Unknown/Equivocal, ER Positive, Oncotype Intermediate Risk (18 - 30), Chemotherapy Preferred AJCC Stage Grouping: IIA Current Disease Status: No Distant Mets or Local Recurrence AJCC M Stage: 0 ER Status: Positive (+) AJCC N Stage: 0 AJCC T Stage: 2 HER2/neu: Negative (-) PR Status: Positive (+) Node Status: Negative (-) Has this patient completed genomic testing? Yes - Oncotype DX(R) Oncotype Recurrence Score: 23 Treatment Preferred: Chemotherapy  Intent of Therapy: Curative Intent, Discussed with Patient

## 2015-12-20 ENCOUNTER — Telehealth: Payer: Self-pay | Admitting: *Deleted

## 2015-12-20 NOTE — Telephone Encounter (Signed)
Pt called stating that she hasn't received prescriptions for steroids.  Reviewed med list & script noted but not sent to pharmacy b/c none listed.  Pharmacy obtained & called anti-nausea & steroid scripts in as written.

## 2015-12-22 ENCOUNTER — Telehealth: Payer: Self-pay | Admitting: *Deleted

## 2015-12-22 ENCOUNTER — Other Ambulatory Visit: Payer: Self-pay | Admitting: *Deleted

## 2015-12-22 NOTE — Telephone Encounter (Signed)
"  I need the dexamethasone 4 mg tablets called in to the [facility]. I work at [employer] today from 7:00 am till 7:30 pm and forgot to bring my medicine.  I live an hour away.  I messed up.  If you could call in two pills I'll take the other two when I get home."  Declined offer to take both doses at work with food to ensure sleep not affected.  Also let her know she may have to pay out of  [facility] as this was just filled on 12-18-2015.  No further questions or needs.  Called this order to [facility].

## 2015-12-22 NOTE — Telephone Encounter (Signed)
Patient called @ 830  She stated that she has TMT tomorrow, However she forgot to take her DECADRON 4mg . this a.m. Pt. Said she's here in [place] right now @ work @ [employer] stated that she left her meds. @ home and only need 2 tablets to take today because she'll be @ work until 7:30. She ask if she could please get two called in to [facility]. This am. Pt. sked if nurse could please call her back on her cell phone.

## 2015-12-23 ENCOUNTER — Other Ambulatory Visit: Payer: Self-pay | Admitting: Hematology

## 2015-12-23 ENCOUNTER — Encounter: Payer: Self-pay | Admitting: *Deleted

## 2015-12-23 ENCOUNTER — Ambulatory Visit (HOSPITAL_BASED_OUTPATIENT_CLINIC_OR_DEPARTMENT_OTHER): Payer: BLUE CROSS/BLUE SHIELD

## 2015-12-23 VITALS — BP 133/71 | HR 64 | Temp 98.4°F | Resp 18

## 2015-12-23 DIAGNOSIS — Z5111 Encounter for antineoplastic chemotherapy: Secondary | ICD-10-CM | POA: Diagnosis not present

## 2015-12-23 DIAGNOSIS — C50412 Malignant neoplasm of upper-outer quadrant of left female breast: Secondary | ICD-10-CM | POA: Diagnosis not present

## 2015-12-23 DIAGNOSIS — Z5189 Encounter for other specified aftercare: Secondary | ICD-10-CM

## 2015-12-23 DIAGNOSIS — Z170 Estrogen receptor positive status [ER+]: Principal | ICD-10-CM

## 2015-12-23 MED ORDER — PALONOSETRON HCL INJECTION 0.25 MG/5ML
0.2500 mg | Freq: Once | INTRAVENOUS | Status: AC
  Administered 2015-12-23: 0.25 mg via INTRAVENOUS

## 2015-12-23 MED ORDER — DEXAMETHASONE SODIUM PHOSPHATE 10 MG/ML IJ SOLN
10.0000 mg | Freq: Once | INTRAMUSCULAR | Status: AC
  Administered 2015-12-23: 10 mg via INTRAVENOUS

## 2015-12-23 MED ORDER — SODIUM CHLORIDE 0.9 % IV SOLN
Freq: Once | INTRAVENOUS | Status: AC
  Administered 2015-12-23: 10:00:00 via INTRAVENOUS

## 2015-12-23 MED ORDER — SODIUM CHLORIDE 0.9 % IV SOLN
600.0000 mg/m2 | Freq: Once | INTRAVENOUS | Status: AC
  Administered 2015-12-23: 1420 mg via INTRAVENOUS
  Filled 2015-12-23: qty 71

## 2015-12-23 MED ORDER — PALONOSETRON HCL INJECTION 0.25 MG/5ML
INTRAVENOUS | Status: AC
  Filled 2015-12-23: qty 5

## 2015-12-23 MED ORDER — PEGFILGRASTIM 6 MG/0.6ML ~~LOC~~ PSKT
6.0000 mg | PREFILLED_SYRINGE | Freq: Once | SUBCUTANEOUS | Status: AC
  Administered 2015-12-23: 6 mg via SUBCUTANEOUS
  Filled 2015-12-23: qty 0.6

## 2015-12-23 MED ORDER — SODIUM CHLORIDE 0.9 % IV SOLN
75.0000 mg/m2 | Freq: Once | INTRAVENOUS | Status: AC
  Administered 2015-12-23: 180 mg via INTRAVENOUS
  Filled 2015-12-23: qty 18

## 2015-12-23 MED ORDER — DEXAMETHASONE SODIUM PHOSPHATE 10 MG/ML IJ SOLN
INTRAMUSCULAR | Status: AC
  Filled 2015-12-23: qty 1

## 2015-12-23 NOTE — Patient Instructions (Signed)
[facility] Discharge Instructions for Patients Receiving Chemotherapy  Today you received the following chemotherapy agents Taxotere & Cytoxan  To help prevent nausea and vomiting after your treatment, we encourage you to take your nausea medication as directed.   If you develop nausea and vomiting that is not controlled by your nausea medication, call the clinic.   BELOW ARE SYMPTOMS THAT SHOULD BE REPORTED IMMEDIATELY:  *FEVER GREATER THAN 100.5 F  *CHILLS WITH OR WITHOUT FEVER  NAUSEA AND VOMITING THAT IS NOT CONTROLLED WITH YOUR NAUSEA MEDICATION  *UNUSUAL SHORTNESS OF BREATH  *UNUSUAL BRUISING OR BLEEDING  TENDERNESS IN MOUTH AND THROAT WITH OR WITHOUT PRESENCE OF ULCERS  *URINARY PROBLEMS  *BOWEL PROBLEMS  UNUSUAL RASH Items with * indicate a potential emergency and should be followed up as soon as possible.  Feel free to call the clinic you have any questions or concerns. The clinic phone number is [phone].  Please show the [facility] at check-in to the Emergency Department and triage nurse.    Docetaxel injection What is this medicine? DOCETAXEL (doe se TAX el) is a chemotherapy drug. It targets fast dividing cells, like cancer cells, and causes these cells to die. This medicine is used to treat many types of cancers like breast cancer, certain stomach cancers, head and neck cancer, lung cancer, and prostate cancer. This medicine may be used for other purposes; ask your health care provider or pharmacist if you have questions. What should I tell my health care provider before I take this medicine? They need to know if you have any of these conditions: -infection (especially a virus infection such as chickenpox, cold sores, or herpes) -liver disease -low blood counts, like low white cell, platelet, or red cell counts -an unusual or allergic reaction to docetaxel, polysorbate 80, other chemotherapy agents, other medicines, foods, dyes, or  preservatives -pregnant or trying to get pregnant -breast-feeding How should I use this medicine? This drug is given as an infusion into a vein. It is administered in a hospital or clinic by a specially trained health care professional. Talk to your pediatrician regarding the use of this medicine in children. Special care may be needed. Overdosage: If you think you have taken too much of this medicine contact a poison control center or emergency room at once. NOTE: This medicine is only for you. Do not share this medicine with others. What if I miss a dose? It is important not to miss your dose. Call your doctor or health care professional if you are unable to keep an appointment. What may interact with this medicine? -cyclosporine -erythromycin -ketoconazole -medicines to increase blood counts like filgrastim, pegfilgrastim, sargramostim -vaccines Talk to your doctor or health care professional before taking any of these medicines: -acetaminophen -aspirin -ibuprofen -ketoprofen -naproxen This list may not describe all possible interactions. Give your health care provider a list of all the medicines, herbs, non-prescription drugs, or dietary supplements you use. Also tell them if you smoke, drink alcohol, or use illegal drugs. Some items may interact with your medicine. What should I watch for while using this medicine? Your condition will be monitored carefully while you are receiving this medicine. You will need important blood work done while you are taking this medicine. This drug may make you feel generally unwell. This is not uncommon, as chemotherapy can affect healthy cells as well as cancer cells. Report any side effects. Continue your course of treatment even though you feel ill unless your doctor  tells you to stop. In some cases, you may be given additional medicines to help with side effects. Follow all directions for their use. Call your doctor or health care professional for  advice if you get a fever, chills or sore throat, or other symptoms of a cold or flu. Do not treat yourself. This drug decreases your body's ability to fight infections. Try to avoid being around people who are sick. This medicine may increase your risk to bruise or bleed. Call your doctor or health care professional if you notice any unusual bleeding. This medicine may contain alcohol in the product. You may get drowsy or dizzy. Do not drive, use machinery, or do anything that needs mental alertness until you know how this medicine affects you. Do not stand or sit up quickly, especially if you are an older patient. This reduces the risk of dizzy or fainting spells. Avoid alcoholic drinks. Do not become pregnant while taking this medicine. Women should inform their doctor if they wish to become pregnant or think they might be pregnant. There is a potential for serious side effects to an unborn child. Talk to your health care professional or pharmacist for more information. Do not breast-feed an infant while taking this medicine. What side effects may I notice from receiving this medicine? Side effects that you should report to your doctor or health care professional as soon as possible: -allergic reactions like skin rash, itching or hives, swelling of the face, lips, or tongue -low blood counts - This drug may decrease the number of white blood cells, red blood cells and platelets. You may be at increased risk for infections and bleeding. -signs of infection - fever or chills, cough, sore throat, pain or difficulty passing urine -signs of decreased platelets or bleeding - bruising, pinpoint red spots on the skin, black, tarry stools, nosebleeds -signs of decreased red blood cells - unusually weak or tired, fainting spells, lightheadedness -breathing problems -fast or irregular heartbeat -low blood pressure -mouth sores -nausea and vomiting -pain, swelling, redness or irritation at the injection  site -pain, tingling, numbness in the hands or feet -swelling of the ankle, feet, hands -weight gain Side effects that usually do not require medical attention (report to your prescriber or health care professional if they continue or are bothersome): -bone pain -complete hair loss including hair on your head, underarms, pubic hair, eyebrows, and eyelashes -diarrhea -excessive tearing -changes in the color of fingernails -loosening of the fingernails -nausea -muscle pain -red flush to skin -sweating -weak or tired This list may not describe all possible side effects. Call your doctor for medical advice about side effects. You may report side effects to FDA at 1-800-FDA-1088. Where should I keep my medicine? This drug is given in a hospital or clinic and will not be stored at home. NOTE: This sheet is a summary. It may not cover all possible information. If you have questions about this medicine, talk to your doctor, pharmacist, or health care provider.    2016, [employer] Standard. (2014-03-02 16:04:57)    Cyclophosphamide injection What is this medicine? CYCLOPHOSPHAMIDE (sye kloe FOSS fa mide) is a chemotherapy drug. It slows the growth of cancer cells. This medicine is used to treat many types of cancer like lymphoma, myeloma, leukemia, breast cancer, and ovarian cancer, to name a few. This medicine may be used for other purposes; ask your health care provider or pharmacist if you have questions. What should I tell my health care provider before I take this  medicine? They need to know if you have any of these conditions: -blood disorders -history of other chemotherapy -infection -kidney disease -liver disease -recent or ongoing radiation therapy -tumors in the bone marrow -an unusual or allergic reaction to cyclophosphamide, other chemotherapy, other medicines, foods, dyes, or preservatives -pregnant or trying to get pregnant -breast-feeding How should I use this  medicine? This drug is usually given as an injection into a vein or muscle or by infusion into a vein. It is administered in a hospital or clinic by a specially trained health care professional. Talk to your pediatrician regarding the use of this medicine in children. Special care may be needed. Overdosage: If you think you have taken too much of this medicine contact a poison control center or emergency room at once. NOTE: This medicine is only for you. Do not share this medicine with others. What if I miss a dose? It is important not to miss your dose. Call your doctor or health care professional if you are unable to keep an appointment. What may interact with this medicine? This medicine may interact with the following medications: -amiodarone -amphotericin B -azathioprine -certain antiviral medicines for HIV or AIDS such as protease inhibitors (e.g., indinavir, ritonavir) and zidovudine -certain blood pressure medications such as benazepril, captopril, enalapril, fosinopril, lisinopril, moexipril, monopril, perindopril, quinapril, ramipril, trandolapril -certain cancer medications such as anthracyclines (e.g., daunorubicin, doxorubicin), busulfan, cytarabine, paclitaxel, pentostatin, tamoxifen, trastuzumab -certain diuretics such as chlorothiazide, chlorthalidone, hydrochlorothiazide, indapamide, metolazone -certain medicines that treat or prevent blood clots like warfarin -certain muscle relaxants such as succinylcholine -cyclosporine -etanercept -indomethacin -medicines to increase blood counts like filgrastim, pegfilgrastim, sargramostim -medicines used as general anesthesia -metronidazole -natalizumab This list may not describe all possible interactions. Give your health care provider a list of all the medicines, herbs, non-prescription drugs, or dietary supplements you use. Also tell them if you smoke, drink alcohol, or use illegal drugs. Some items may interact with your  medicine. What should I watch for while using this medicine? Visit your doctor for checks on your progress. This drug may make you feel generally unwell. This is not uncommon, as chemotherapy can affect healthy cells as well as cancer cells. Report any side effects. Continue your course of treatment even though you feel ill unless your doctor tells you to stop. Drink water or other fluids as directed. Urinate often, even at night. In some cases, you may be given additional medicines to help with side effects. Follow all directions for their use. Call your doctor or health care professional for advice if you get a fever, chills or sore throat, or other symptoms of a cold or flu. Do not treat yourself. This drug decreases your body's ability to fight infections. Try to avoid being around people who are sick. This medicine may increase your risk to bruise or bleed. Call your doctor or health care professional if you notice any unusual bleeding. Be careful brushing and flossing your teeth or using a toothpick because you may get an infection or bleed more easily. If you have any dental work done, tell your dentist you are receiving this medicine. You may get drowsy or dizzy. Do not drive, use machinery, or do anything that needs mental alertness until you know how this medicine affects you. Do not become pregnant while taking this medicine or for 1 year after stopping it. Women should inform their doctor if they wish to become pregnant or think they might be pregnant. Men should not father a child while  taking this medicine and for 4 months after stopping it. There is a potential for serious side effects to an unborn child. Talk to your health care professional or pharmacist for more information. Do not breast-feed an infant while taking this medicine. This medicine may interfere with the ability to have a child. This medicine has caused ovarian failure in some women. This medicine has caused reduced sperm  counts in some men. You should talk with your doctor or health care professional if you are concerned about your fertility. If you are going to have surgery, tell your doctor or health care professional that you have taken this medicine. What side effects may I notice from receiving this medicine? Side effects that you should report to your doctor or health care professional as soon as possible: -allergic reactions like skin rash, itching or hives, swelling of the face, lips, or tongue -low blood counts - this medicine may decrease the number of white blood cells, red blood cells and platelets. You may be at increased risk for infections and bleeding. -signs of infection - fever or chills, cough, sore throat, pain or difficulty passing urine -signs of decreased platelets or bleeding - bruising, pinpoint red spots on the skin, black, tarry stools, blood in the urine -signs of decreased red blood cells - unusually weak or tired, fainting spells, lightheadedness -breathing problems -dark urine -dizziness -palpitations -swelling of the ankles, feet, hands -trouble passing urine or change in the amount of urine -weight gain -yellowing of the eyes or skin Side effects that usually do not require medical attention (report to your doctor or health care professional if they continue or are bothersome): -changes in nail or skin color -hair loss -missed menstrual periods -mouth sores -nausea, vomiting This list may not describe all possible side effects. Call your doctor for medical advice about side effects. You may report side effects to FDA at 1-800-FDA-1088. Where should I keep my medicine? This drug is given in a hospital or clinic and will not be stored at home. NOTE: This sheet is a summary. It may not cover all possible information. If you have questions about this medicine, talk to your doctor, pharmacist, or health care provider.    2016, [employer]. (2011-12-29  16:22:58)

## 2015-12-23 NOTE — Progress Notes (Signed)
Per Dr. Burr Medico approved using labs from 12/16/2015 for 1st time treatment of Taxotere and Cytoxan today.

## 2015-12-25 ENCOUNTER — Ambulatory Visit: Payer: BLUE CROSS/BLUE SHIELD

## 2015-12-27 ENCOUNTER — Telehealth: Payer: Self-pay | Admitting: *Deleted

## 2015-12-27 NOTE — Telephone Encounter (Addendum)
Received call from pt reporting that she is having terrible pain in her legs & wants to know if she can take some of her vicodin that she has left.  Informed OK.  She received neulasta. She states she hasn't slept much due to the decadron but took last dose Sun so is hoping to sleep better tonight.  She states she feels like her tongue is burned & is using biotene.  Instructed to cont & to also try baking soda/salt water solution to swish & spit as many times as she wants daily.  She also reports having some indigestion immediately with just one bite of vanilla wafer. Will talk to Dr Burr Medico about antacid.  OK'd pt to take Tums or pepcid.  Note to Dr Burr Medico.

## 2015-12-29 ENCOUNTER — Telehealth: Payer: Self-pay | Admitting: *Deleted

## 2015-12-29 NOTE — Telephone Encounter (Signed)
Received message from pt reporting having bone pain, spasm after chemo.   Spoke with pt, and was informed that pt was doing fine after chemo.  However, for past 2 days, pt experienced bone pain, fatigue, and muscle spasm. Last chemo on 12/23/15 with Neulasta  Onpro.   Informed pt that bone pain was one of side effects of Neulasta.  Pt is able to perform ADLs but with some effort.  Has been taking Claritin daily, taking Tylenol with little relief.  Took Vicodin - left over  from surgery - to help with the pain.  Pt is out of Vicodin. Instructed pt to keep appts as scheduled for 12/30/15.  Dr. Burr Medico will further evaluate pt.

## 2015-12-30 ENCOUNTER — Encounter: Payer: Self-pay | Admitting: *Deleted

## 2015-12-30 ENCOUNTER — Encounter: Payer: Self-pay | Admitting: Hematology

## 2015-12-30 ENCOUNTER — Other Ambulatory Visit: Payer: Self-pay | Admitting: *Deleted

## 2015-12-30 ENCOUNTER — Ambulatory Visit (HOSPITAL_BASED_OUTPATIENT_CLINIC_OR_DEPARTMENT_OTHER): Payer: BLUE CROSS/BLUE SHIELD | Admitting: Hematology

## 2015-12-30 ENCOUNTER — Other Ambulatory Visit (HOSPITAL_BASED_OUTPATIENT_CLINIC_OR_DEPARTMENT_OTHER): Payer: BLUE CROSS/BLUE SHIELD

## 2015-12-30 VITALS — BP 121/81 | HR 86 | Temp 98.9°F | Resp 20 | Ht 65.0 in | Wt 269.0 lb

## 2015-12-30 DIAGNOSIS — C50412 Malignant neoplasm of upper-outer quadrant of left female breast: Secondary | ICD-10-CM

## 2015-12-30 DIAGNOSIS — Z170 Estrogen receptor positive status [ER+]: Secondary | ICD-10-CM

## 2015-12-30 DIAGNOSIS — I10 Essential (primary) hypertension: Secondary | ICD-10-CM

## 2015-12-30 DIAGNOSIS — K1231 Oral mucositis (ulcerative) due to antineoplastic therapy: Secondary | ICD-10-CM | POA: Diagnosis not present

## 2015-12-30 LAB — CBC WITH DIFFERENTIAL/PLATELET
BASO%: 0.2 % (ref 0.0–2.0)
BASOS ABS: 0 10*3/uL (ref 0.0–0.1)
EOS%: 0.3 % (ref 0.0–7.0)
Eosinophils Absolute: 0 10*3/uL (ref 0.0–0.5)
HCT: 43.4 % (ref 34.8–46.6)
HGB: 14 g/dL (ref 11.6–15.9)
LYMPH%: 27.5 % (ref 14.0–49.7)
MCH: 28.1 pg (ref 25.1–34.0)
MCHC: 32.4 g/dL (ref 31.5–36.0)
MCV: 86.7 fL (ref 79.5–101.0)
MONO#: 1 10*3/uL — ABNORMAL HIGH (ref 0.1–0.9)
MONO%: 15.9 % — AB (ref 0.0–14.0)
NEUT#: 3.4 10*3/uL (ref 1.5–6.5)
NEUT%: 56.1 % (ref 38.4–76.8)
Platelets: 150 10*3/uL (ref 145–400)
RBC: 5 10*6/uL (ref 3.70–5.45)
RDW: 13.8 % (ref 11.2–14.5)
WBC: 6 10*3/uL (ref 3.9–10.3)
lymph#: 1.6 10*3/uL (ref 0.9–3.3)

## 2015-12-30 LAB — COMPREHENSIVE METABOLIC PANEL
ALT: 19 U/L (ref 0–55)
AST: 14 U/L (ref 5–34)
Albumin: 3.2 g/dL — ABNORMAL LOW (ref 3.5–5.0)
Alkaline Phosphatase: 106 U/L (ref 40–150)
Anion Gap: 9 mEq/L (ref 3–11)
BUN: 9.5 mg/dL (ref 7.0–26.0)
CHLORIDE: 102 meq/L (ref 98–109)
CO2: 26 meq/L (ref 22–29)
CREATININE: 0.8 mg/dL (ref 0.6–1.1)
Calcium: 9.1 mg/dL (ref 8.4–10.4)
EGFR: 88 mL/min/{1.73_m2} — ABNORMAL LOW (ref >90)
GLUCOSE: 103 mg/dL (ref 70–140)
POTASSIUM: 4.4 meq/L (ref 3.5–5.1)
SODIUM: 137 meq/L (ref 136–145)
Total Bilirubin: 0.49 mg/dL (ref 0.20–1.20)
Total Protein: 6.6 g/dL (ref 6.4–8.3)

## 2015-12-30 MED ORDER — ZOLPIDEM TARTRATE 5 MG PO TABS: 5.0000 mg | ORAL_TABLET | Freq: Every evening | ORAL | 0 refills | Status: DC | PRN

## 2015-12-30 MED ORDER — HYDROCODONE-ACETAMINOPHEN 5-325 MG PO TABS: 1.0000 | ORAL_TABLET | Freq: Four times a day (QID) | ORAL | 0 refills | Status: DC | PRN

## 2015-12-30 MED ORDER — MAGIC MOUTHWASH W/LIDOCAINE: 5.0000 mL | Freq: Four times a day (QID) | ORAL | 0 refills | Status: DC | PRN

## 2015-12-30 NOTE — Progress Notes (Addendum)
[place]  [phone] [phone]  Clinic Follow up Note   Patient Care Team: Pcp Not In System as PCP - General 12/30/2015   CHIEF COMPLAINTS:  Follow up left breast cancer  Oncology History   Breast cancer of upper-outer quadrant of left female breast [facility])   Staging form: Breast, AJCC 7th Edition   - Pathologic: Stage IIA (T2, N0, cM0) - Unsigned      Breast cancer of upper-outer quadrant of left female breast ([place])   10/20/2015 Mammogram    Screening mammogram showed new nodular density on the left breast, measuring about 2 cm. the well defined nodules bilaterally are otherwise stable.      10/27/2015 Initial Biopsy    Left breast 2:00 position mass biopsy showed infiltrating ductal carcinoma.       10/27/2015 Initial Diagnosis    Breast cancer of upper-outer quadrant of left female breast ([place])      10/27/2015 Receptors her2    ER 100% positive, strong staining, PR 90% positive, strong staining, HER-2 IHC 2+, FISH negative.      11/16/2015 Surgery    Left breast lumpectomy and sentinel lymph node biopsy.      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 Margins were negative. One sentinel lymph node was negative. Lymphovascular invasion was negative.      11/16/2015 Oncotype testing    RS 23, intermediate risk, which predicts 10 year risk of distant recurrence 15% with tamoxifen      12/23/2015 -  Adjuvant Chemotherapy    Docetaxel and Cytoxan, every 3 weeks, with Neulasta on day 2, for 4 cycles       HISTORY OF PRESENTING ILLNESS:  Nicole Schmitt 55 y.o. female is here because of Her recently resected left breast cancer. She presents to the clinic by herself. She was referred by her surgeon Dr. Excell Seltzer.  She lives just outside of [place] but works as an Therapist, sports on the Advertising account planner at [employer]. Her breast cancer was discovered by  screening mammogram, and her initial workup was done in [place].She recently underwent a screening mamogram which revealed a possible mass in the left breast. Subsequent imaging included diagnostic mamogram showing a persistent area of asymmetric density in the left breast and ultrasound showing a 1.8 x 0.7 cm lobulated mass at the 2 o'clock position in the left breast 6 cm from the nipple. An ultrasound guided breast biopsy was performed on October 27, 2015 with pathology revealing invasive ductal carcinoma of the breast. She was referred to (surgeon Dr. Excell Seltzer, and underwent left breast lumpectomy and sentinel lymph node biopsy on 11/16/2015. The surgical pathology showed invasive grade 3 ductal carcinoma, 2.2 cm, one lymph node was negative.   She has been recovering well from her surgery. The pain to this surgical incision is minimal, she does not take much pain medication. No limited range of movement of left shoulder. Her appetite and energy level has been back to normal, she denies any other symptoms.  Her husband was diagnosed with metastatic melanoma in early 2016, has been receiving immunotherapy at [facility]. Her father was also recently diagnosed with CLL, but does not quite treatment. She has been under lot of stress in the past few years.  CURRENT THERAPY: Docetaxel and Cytoxan every 3 weeks, with Neulasta on day 2, started on 12/23/2015.  INTERIM HISTORY: [name] returns for a toxicity check up after  her first dose of chemotherapy. She tolerated moderately well. She developed epigastric discomfort, mouth sore, and severe bone pain 3 days after chemotherapy. She took a few hydrocodone yesterday, which helped with the bone pain. She feels better overall this morning. Her appetite is decreased, she is able to eat and drink adequately. No fever or chills. She lost 3 pounds in the past week.  MEDICAL HISTORY:  Past Medical History:  Diagnosis Date  . Breast cancer ([place])   . Cancer ([place])  1990   cervical   . Hypertension   . Seizures ([place])    > 20 years following fall from horse    SURGICAL HISTORY: Past Surgical History:  Procedure Laterality Date  . BREAST LUMPECTOMY WITH RADIOACTIVE SEED AND SENTINEL LYMPH NODE BIOPSY Left 11/16/2015   Procedure: BREAST LUMPECTOMY WITH RADIOACTIVE SEED AND SENTINEL LYMPH NODE BIOPSY;  Surgeon: Excell Seltzer, MD;  Location: [facility];  Service: General;  Laterality: Left;  . CESAREAN SECTION    . COLOSTOMY    . WISDOM TOOTH EXTRACTION      SOCIAL HISTORY: Social History   Social History  . Marital status: Married    Spouse name: N/A  . Number of children: N/A  . Years of education: N/A   Occupational History  . Not on file.   Social History Main Topics  . Smoking status: Never Smoker  . Smokeless tobacco: Never Used  . Alcohol use Yes     Comment: rare  . Drug use: No  . Sexual activity: Yes   Other Topics Concern  . Not on file   Social History Narrative  . No narrative on file    FAMILY HISTORY: Family History  Problem Relation Age of Onset  . Cancer Maternal Uncle     liver cancer/environmental exposure  . Cancer Paternal Uncle     liver cancer/environmental exposure    ALLERGIES:  has No Known Allergies.  MEDICATIONS:  Current Outpatient Prescriptions  Medication Sig Dispense Refill  . CLINDAGEL 1 % gel Apply 1 application topically daily.     Marland Kitchen dexamethasone (DECADRON) 4 MG tablet Take 2 tablets (8 mg total) by mouth 2 (two) times daily. Start the day before Taxotere. Then again the day after chemo for 3 days. 30 tablet 1  . doxycycline (VIBRAMYCIN) 100 MG capsule Take 100 mg by mouth daily as needed.     . hydrochlorothiazide (HYDRODIURIL) 25 MG tablet Take 25 mg by mouth daily. 12.5 mg to 25 mg dependent on bp medication    . HYDROcodone-acetaminophen (NORCO/VICODIN) 5-325 MG tablet Take 1-2 tablets by mouth every 4 (four) hours as needed for moderate pain or severe pain.  (Patient not taking: Reported on 12/13/2015) 15 tablet 0  . ondansetron (ZOFRAN) 8 MG tablet Take 1 tablet (8 mg total) by mouth 2 (two) times daily as needed for refractory nausea / vomiting. Start on day 3 after chemo. 30 tablet 1  . prochlorperazine (COMPAZINE) 10 MG tablet Take 1 tablet (10 mg total) by mouth every 6 (six) hours as needed (Nausea or vomiting). 30 tablet 1  . valsartan (DIOVAN) 160 MG tablet Take 160 mg by mouth daily.     No current facility-administered medications for this visit.     REVIEW OF SYSTEMS:   Constitutional: Denies fevers, chills or abnormal night sweats Eyes: Denies blurriness of vision, double vision or watery eyes Ears, nose, mouth, throat, and face: Denies mucositis or sore throat Respiratory: Denies cough, dyspnea or wheezes Cardiovascular:  Denies palpitation, chest discomfort or lower extremity swelling Gastrointestinal:  Denies nausea, heartburn or change in bowel habits Skin: Denies abnormal skin rashes Lymphatics: Denies new lymphadenopathy or easy bruising Neurological:Denies numbness, tingling or new weaknesses Behavioral/Psych: Mood is stable, no new changes  All other systems were reviewed with the patient and are negative.  PHYSICAL EXAMINATION: ECOG PERFORMANCE STATUS: 1 - Symptomatic but completely ambulatory  Vitals:   12/30/15 0841  BP: 121/81  Pulse: 86  Resp: 20  Temp: 98.9 F (37.2 C)   Filed Weights   12/30/15 0841  Weight: 269 lb (122 kg)    GENERAL:alert, no distress and comfortable SKIN: skin color, texture, turgor are normal, no rashes or significant lesions EYES: normal, conjunctiva are pink and non-injected, sclera clear OROPHARYNX:no exudate, no erythema and lips, buccal mucosa, and tongue normal  NECK: supple, thyroid normal size, non-tender, without nodularity LYMPH:  no palpable lymphadenopathy in the cervical, axillary or inguinal LUNGS: clear to auscultation and percussion with normal breathing  effort HEART: regular rate & rhythm and no murmurs and no lower extremity edema ABDOMEN:abdomen soft, non-tender and normal bowel sounds Musculoskeletal:no cyanosis of digits and no clubbing  PSYCH: alert & oriented x 3 with fluent speech NEURO: no focal motor/sensory deficits Breasts: Breast inspection showed them to be symmetrical with no nipple discharge. The incision in the left breast has healed well, no discharge or skin erythema. Palpation of the breasts and axilla revealed no obvious mass that I could appreciate.  LABORATORY DATA:  I have reviewed the data as listed CBC Latest Ref Rng & Units 12/30/2015 12/16/2015  WBC 3.9 - 10.3 10e3/uL 6.0 8.7  Hemoglobin 11.6 - 15.9 g/dL 14.0 13.2  Hematocrit 34.8 - 46.6 % 43.4 40.7  Platelets 145 - 400 10e3/uL 150 297     CMP Latest Ref Rng & Units 12/30/2015 12/16/2015 11/04/2015  Glucose 70 - 140 mg/dl 103 114 92  BUN 7.0 - 26.0 mg/dL 9.5 16.8 10  Creatinine 0.6 - 1.1 mg/dL 0.8 0.8 0.67  Sodium 136 - 145 mEq/L 137 142 140  Potassium 3.5 - 5.1 mEq/L 4.4 4.1 4.6  Chloride 101 - 111 mmol/L - - 105  CO2 22 - 29 mEq/L '26 28 29  ' Calcium 8.4 - 10.4 mg/dL 9.1 9.3 9.5  Total Protein 6.4 - 8.3 g/dL 6.6 7.2 -  Total Bilirubin 0.20 - 1.20 mg/dL 0.49 0.33 -  Alkaline Phos 40 - 150 U/L 106 120 -  AST 5 - 34 U/L 14 14 -  ALT 0 - 55 U/L 19 14 -    PATHOLOGY REPORT Diagnosis 11/16/2015    1. Breast, lumpectomy, Left w/seed - INVASIVE GRADE 3 DUCTAL CARCINOMA, SPANNING 2.2 CM IN GREATEST DIMENSION. 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 - ASSOCIATED PROMINENT LYMPHOID RESPONSE. - INVASIVE DUCTAL CARCINOMA IS FOCALLY 0.1 TO 0.2 CM AWAY FROM MEDIAL MARGIN BUT MARGINAL SURFACE IS NEGATIVE FOR INVASIVE TUMOR. - DUCTAL CARCINOMA IN SITU IS FOCALLY 0.1 CM AWAY FROM LATERAL MARGIN - OTHER MARGINS ARE NEGATIVE. - SEE ONCOLOGY TEMPLATE. 2. Lymph node, sentinel, biopsy, Left Axillary - ONE BENIGN LYMPH NODE WITH NO TUMOR SEEN  (0/1). Microscopic Comment 1. BREAST, INVASIVE TUMOR, WITH LYMPH NODES PRESENT Specimen, including laterality and lymph node sampling (sentinel, non-sentinel): Left partial breast with left axillary sentinel lymph node. Procedure: Left breast lumpectomy with left axillary sentinel lymph node biopsy. Histologic type: Invasive ductal carcinoma. Grade: 3. Tubule formation: 3. Nuclear pleomorphism: 2. Mitotic: 3. Tumor size (gross measurement): 2.2  cm. Margins: Invasive, distance to closest margin: Invasive ductal carcinoma is focally 0.1 to 0.2 cm away from medial margin. In-situ, distance to closest margin: Ductal carcinoma in situ is focally 0.1 cm from lateral margin. If margin positive, focally or broadly: Marginal surface is not positive for tumor. Lymphovascular invasion: Definitive lymph/vascular invasion is not identified. Ductal carcinoma in situ: Yes. Grade: Intermediate grade. Extensive intraductal component: No. Lobular neoplasia: Not identified. Tumor focality: Unifocal. Extent of tumor: Tumor confined to breast parenchyma. Skin: Not received. Nipple: Not received. Skeletal muscle: Not received. Lymph nodes: Examined: 1 Sentinel. 0 Non-sentinel. 1 Total. Lymph nodes with metastasis: 0. Isolated tumor cells (< 0.2 mm): 0. Micrometastasis: (> 0.2 mm and < 2.0 mm): 0. Macrometastasis: (> 2.0 mm): 0. Extracapsular extension: Not applicable. Breast prognostic profile: Assessed on previous biopsy ([id number]): Estrogen receptor: 100%, positive. Progesterone receptor: 90%, positive. Her 2 neu: 2+ equivocal staining on immunohistochemical stain. FISH is pending on biopsy, per previous biospy report. Ki-67: 30%. Non-neoplastic breast: Fibrocystic changes. TNM: pT2, pN0. (RH:kh 11-18-15)  Diagnosis 10/27/2015 Consult Slide , Left Breast Biopsy - INVASIVE DUCTAL CARCINOMA, SEE COMMENT. - DUCTAL CARCINOMA IN SITU. Microscopic Comment While grading is best performed  on the resection specimen the carcinoma appears grade 3. E-cadherin is positive confirming a ductal phenotype. Beta-catenin is positive. p53 exhibits weak staining. ER: Positive, 100%, strong staining intensity. PR: Positive, 90%, strong staining intensity. Ki-67: 30% Her2 IHC: 2+ Equivocal, FISH (-), with average copy number of 2, and a ratio of 1.07.  Oncotype RS 23, intermediate risk, which predicts 10 year risk of distant recurrence 15% with tamoxifen  RADIOGRAPHIC STUDIES: I have personally reviewed the radiological images as listed and agreed with the findings in the report. No results found.  ASSESSMENT & PLAN: 55 year old Caucasian post menopause female, presented with screening discovered left breast cancer  1. 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 -We discussed her Oncotype DX test results. The recurrence score is 23, which is intermedia risk, it protects 10 year of distant recurrence risk of 15% with tamoxifen. The benefit of adjuvant chemotherapy intermediate risk group is uncertain, however given her young age, grade 3 disease, I think adjuvant chemo will probably reduce her risk of recurrence although the benefit is not very high. She is very concerned about cancer recurrence. After lengthy discussion, we decided to proceed with adjuvant chemotherapy -Given her an node negative disease, I recommend docetaxel and Cytoxan every 3 weeks for total 4 cycles. -The goal of therapy is curative. -She had mild to moderate side effects from first cycle chemotherapy, lab reviewed, unremarkable -She will return in 2 weeks for cycle 2 -Due to significant bone pain from Neulasta, I may consider holding Neulasta for cycle 2  2. Mucositis, secondary to  chemotherapy -I called in magic mouthwash  3. HTN -She is on antihypertensive medication, including HCTZ -We discussed the impact of chemotherapy on her blood pressure. We'll monitor her blood pressure closely during the chemotherapy. We may need to hold her HCTZ if her BP drops after chemo due to decreased po intake.   PLAN -called in magic mouthwash -May consider holding Neulasta for cycle 2, due to significant bone pain -She will return in 2 weeks for cycle 2 chemotherapy   All questions were answered. The patient knows  to call the clinic with any problems, questions or concerns. I spent 25 minutes counseling the patient face to face. The total time spent in the appointment was 30 minutes and more than 50% was on counseling.     [name], MD 12/30/2015

## 2016-01-13 ENCOUNTER — Ambulatory Visit (HOSPITAL_BASED_OUTPATIENT_CLINIC_OR_DEPARTMENT_OTHER): Payer: BLUE CROSS/BLUE SHIELD

## 2016-01-13 ENCOUNTER — Other Ambulatory Visit (HOSPITAL_BASED_OUTPATIENT_CLINIC_OR_DEPARTMENT_OTHER): Payer: BLUE CROSS/BLUE SHIELD

## 2016-01-13 ENCOUNTER — Ambulatory Visit (HOSPITAL_BASED_OUTPATIENT_CLINIC_OR_DEPARTMENT_OTHER): Payer: BLUE CROSS/BLUE SHIELD | Admitting: Hematology

## 2016-01-13 ENCOUNTER — Encounter: Payer: Self-pay | Admitting: *Deleted

## 2016-01-13 ENCOUNTER — Encounter: Payer: Self-pay | Admitting: Hematology

## 2016-01-13 ENCOUNTER — Telehealth: Payer: Self-pay | Admitting: Hematology

## 2016-01-13 VITALS — BP 134/73 | HR 99 | Temp 97.8°F | Resp 18 | Ht 65.0 in | Wt 273.7 lb

## 2016-01-13 DIAGNOSIS — K1231 Oral mucositis (ulcerative) due to antineoplastic therapy: Secondary | ICD-10-CM | POA: Diagnosis not present

## 2016-01-13 DIAGNOSIS — Z170 Estrogen receptor positive status [ER+]: Principal | ICD-10-CM

## 2016-01-13 DIAGNOSIS — Z5189 Encounter for other specified aftercare: Secondary | ICD-10-CM

## 2016-01-13 DIAGNOSIS — C50412 Malignant neoplasm of upper-outer quadrant of left female breast: Secondary | ICD-10-CM | POA: Diagnosis not present

## 2016-01-13 DIAGNOSIS — Z5111 Encounter for antineoplastic chemotherapy: Secondary | ICD-10-CM

## 2016-01-13 DIAGNOSIS — I10 Essential (primary) hypertension: Secondary | ICD-10-CM

## 2016-01-13 DIAGNOSIS — N3000 Acute cystitis without hematuria: Secondary | ICD-10-CM

## 2016-01-13 LAB — CBC WITH DIFFERENTIAL/PLATELET
BASO%: 0.5 % (ref 0.0–2.0)
BASOS ABS: 0.1 10*3/uL (ref 0.0–0.1)
EOS ABS: 0 10*3/uL (ref 0.0–0.5)
EOS%: 0 % (ref 0.0–7.0)
HEMATOCRIT: 39.9 % (ref 34.8–46.6)
HGB: 13.2 g/dL (ref 11.6–15.9)
LYMPH#: 1.5 10*3/uL (ref 0.9–3.3)
LYMPH%: 8.1 % — AB (ref 14.0–49.7)
MCH: 28.3 pg (ref 25.1–34.0)
MCHC: 33 g/dL (ref 31.5–36.0)
MCV: 86 fL (ref 79.5–101.0)
MONO#: 0.3 10*3/uL (ref 0.1–0.9)
MONO%: 1.6 % (ref 0.0–14.0)
NEUT#: 17 10*3/uL — ABNORMAL HIGH (ref 1.5–6.5)
NEUT%: 89.8 % — AB (ref 38.4–76.8)
PLATELETS: 359 10*3/uL (ref 145–400)
RBC: 4.65 10*6/uL (ref 3.70–5.45)
RDW: 14.3 % (ref 11.2–14.5)
WBC: 18.9 10*3/uL — ABNORMAL HIGH (ref 3.9–10.3)

## 2016-01-13 LAB — COMPREHENSIVE METABOLIC PANEL
ALK PHOS: 116 U/L (ref 40–150)
ALT: 23 U/L (ref 0–55)
AST: 13 U/L (ref 5–34)
Albumin: 3.5 g/dL (ref 3.5–5.0)
Anion Gap: 15 mEq/L — ABNORMAL HIGH (ref 3–11)
BILIRUBIN TOTAL: 0.32 mg/dL (ref 0.20–1.20)
BUN: 14.7 mg/dL (ref 7.0–26.0)
CO2: 17 meq/L — AB (ref 22–29)
Calcium: 9.8 mg/dL (ref 8.4–10.4)
Chloride: 107 mEq/L (ref 98–109)
Creatinine: 0.8 mg/dL (ref 0.6–1.1)
EGFR: 88 mL/min/{1.73_m2} — AB (ref >90)
GLUCOSE: 241 mg/dL — AB (ref 70–140)
POTASSIUM: 3.9 meq/L (ref 3.5–5.1)
SODIUM: 140 meq/L (ref 136–145)
TOTAL PROTEIN: 7.3 g/dL (ref 6.4–8.3)

## 2016-01-13 LAB — URINALYSIS, MICROSCOPIC - CHCC
Bilirubin (Urine): NEGATIVE
GLUCOSE UR CHCC: 2000 mg/dL
KETONES: 40 mg/dL
LEUKOCYTE ESTERASE: NEGATIVE
Nitrite: NEGATIVE
PH: 6 (ref 4.6–8.0)
SPECIFIC GRAVITY, URINE: 1.03 (ref 1.003–1.035)
UROBILINOGEN UR: 0.2 mg/dL (ref 0.2–1)

## 2016-01-13 MED ORDER — PALONOSETRON HCL INJECTION 0.25 MG/5ML
0.2500 mg | Freq: Once | INTRAVENOUS | Status: AC
  Administered 2016-01-13: 0.25 mg via INTRAVENOUS

## 2016-01-13 MED ORDER — SODIUM CHLORIDE 0.9 % IV SOLN
600.0000 mg/m2 | Freq: Once | INTRAVENOUS | Status: AC
  Administered 2016-01-13: 1420 mg via INTRAVENOUS
  Filled 2016-01-13: qty 71

## 2016-01-13 MED ORDER — DEXAMETHASONE SODIUM PHOSPHATE 10 MG/ML IJ SOLN
10.0000 mg | Freq: Once | INTRAMUSCULAR | Status: AC
  Administered 2016-01-13: 10 mg via INTRAVENOUS

## 2016-01-13 MED ORDER — DOCETAXEL CHEMO INJECTION 160 MG/16ML
75.0000 mg/m2 | Freq: Once | INTRAVENOUS | Status: AC
  Administered 2016-01-13: 180 mg via INTRAVENOUS
  Filled 2016-01-13: qty 18

## 2016-01-13 MED ORDER — SODIUM CHLORIDE 0.9 % IV SOLN
Freq: Once | INTRAVENOUS | Status: AC
  Administered 2016-01-13: 10:00:00 via INTRAVENOUS

## 2016-01-13 MED ORDER — PALONOSETRON HCL INJECTION 0.25 MG/5ML
INTRAVENOUS | Status: AC
  Filled 2016-01-13: qty 5

## 2016-01-13 MED ORDER — PEGFILGRASTIM 6 MG/0.6ML ~~LOC~~ PSKT
6.0000 mg | PREFILLED_SYRINGE | Freq: Once | SUBCUTANEOUS | Status: AC
  Administered 2016-01-13: 6 mg via SUBCUTANEOUS
  Filled 2016-01-13: qty 0.6

## 2016-01-13 MED ORDER — DEXAMETHASONE SODIUM PHOSPHATE 10 MG/ML IJ SOLN
INTRAMUSCULAR | Status: AC
Start: 2016-01-13 — End: 2016-01-13
  Filled 2016-01-13: qty 1

## 2016-01-13 NOTE — Progress Notes (Signed)
[place]  Telephone:([phone]) [phone] Fax:([phone]) [phone]  Clinic Follow up Note   Patient Care Team: Pcp Not In System as PCP - General 01/13/2016   CHIEF COMPLAINTS:  Follow up left breast cancer  Oncology History   Breast cancer of upper-outer quadrant of left female breast [facility])   Staging form: Breast, AJCC 7th Edition   - Pathologic: Stage IIA (T2, N0, cM0) - Unsigned      Breast cancer of upper-outer quadrant of left female breast ([place])   10/20/2015 Mammogram    Screening mammogram showed new nodular density on the left breast, measuring about 2 cm. the well defined nodules bilaterally are otherwise stable.      10/27/2015 Initial Biopsy    Left breast 2:00 position mass biopsy showed infiltrating ductal carcinoma.       10/27/2015 Initial Diagnosis    Breast cancer of upper-outer quadrant of left female breast ([place])      10/27/2015 Receptors her2    ER 100% positive, strong staining, PR 90% positive, strong staining, HER-2 IHC 2+, FISH negative.      11/16/2015 Surgery    Left breast lumpectomy and sentinel lymph node biopsy.      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 Margins were negative. One sentinel lymph node was negative. Lymphovascular invasion was negative.      11/16/2015 Oncotype testing    RS 23, intermediate risk, which predicts 10 year risk of distant recurrence 15% with tamoxifen      12/23/2015 -  Adjuvant Chemotherapy    Docetaxel and Cytoxan, every 3 weeks, with Neulasta on day 2, for 4 cycles       HISTORY OF PRESENTING ILLNESS:  Nicole Schmitt 55 y.o. female is here because of Her recently resected left breast cancer. She presents to the clinic by herself. She was referred by her surgeon Dr. Excell Seltzer.  She lives just outside of [place] but works as an Therapist, sports on the Advertising account planner at [employer]. Her breast cancer was discovered by  screening mammogram, and her initial workup was done in [place].She recently underwent a screening mamogram which revealed a possible mass in the left breast. Subsequent imaging included diagnostic mamogram showing a persistent area of asymmetric density in the left breast and ultrasound showing a 1.8 x 0.7 cm lobulated mass at the 2 o'clock position in the left breast 6 cm from the nipple. An ultrasound guided breast biopsy was performed on October 27, 2015 with pathology revealing invasive ductal carcinoma of the breast. She was referred to (surgeon Dr. Excell Seltzer, and underwent left breast lumpectomy and sentinel lymph node biopsy on 11/16/2015. The surgical pathology showed invasive grade 3 ductal carcinoma, 2.2 cm, one lymph node was negative.   She has been recovering well from her surgery. The pain to this surgical incision is minimal, she does not take much pain medication. No limited range of movement of left shoulder. Her appetite and energy level has been back to normal, she denies any other symptoms.  Her husband was diagnosed with metastatic melanoma in early 2016, has been receiving immunotherapy at [facility]. Her father was also recently diagnosed with CLL, but does not quite treatment. She has been under lot of stress in the past few years.  CURRENT THERAPY: Docetaxel and Cytoxan every 3 weeks, with Neulasta on day 2, started on 12/23/2015.  INTERIM HISTORY: Nicole Schmitt returns for follow-up and cycle 2 chemotherapy.  She has recovered well well, mouth sore has resolved, she has good appetite and eating well. She has noticed some mild urinary urgency for the past 2 days, no hematuria, no dysuria or urinary frequency. No fever or chills.  MEDICAL HISTORY:  Past Medical History:  Diagnosis Date  . Breast cancer ([place])   . Cancer ([place]) 1990   cervical   . Hypertension   . Seizures ([place])    > 20 years following fall from horse    SURGICAL HISTORY: Past Surgical History:  Procedure  Laterality Date  . BREAST LUMPECTOMY WITH RADIOACTIVE SEED AND SENTINEL LYMPH NODE BIOPSY Left 11/16/2015   Procedure: BREAST LUMPECTOMY WITH RADIOACTIVE SEED AND SENTINEL LYMPH NODE BIOPSY;  Surgeon: Excell Seltzer, MD;  Location: [place];  Service: General;  Laterality: Left;  . CESAREAN SECTION    . COLOSTOMY    . WISDOM TOOTH EXTRACTION      SOCIAL HISTORY: Social History   Social History  . Marital status: Married    Spouse name: N/A  . Number of children: N/A  . Years of education: N/A   Occupational History  . Not on file.   Social History Main Topics  . Smoking status: Never Smoker  . Smokeless tobacco: Never Used  . Alcohol use Yes     Comment: rare  . Drug use: No  . Sexual activity: Yes   Other Topics Concern  . Not on file   Social History Narrative  . No narrative on file    FAMILY HISTORY: Family History  Problem Relation Age of Onset  . Cancer Maternal Uncle     liver cancer/environmental exposure  . Cancer Paternal Uncle     liver cancer/environmental exposure    ALLERGIES:  has No Known Allergies.  MEDICATIONS:  Current Outpatient Prescriptions  Medication Sig Dispense Refill  . CLINDAGEL 1 % gel Apply 1 application topically daily.     Marland Kitchen dexamethasone (DECADRON) 4 MG tablet Take 2 tablets (8 mg total) by mouth 2 (two) times daily. Start the day before Taxotere. Then again the day after chemo for 3 days. (Patient not taking: Reported on 12/30/2015) 30 tablet 1  . doxycycline (VIBRAMYCIN) 100 MG capsule Take 100 mg by mouth daily as needed.     . hydrochlorothiazide (HYDRODIURIL) 25 MG tablet Take 25 mg by mouth daily. 12.5 mg to 25 mg dependent on bp medication    . HYDROcodone-acetaminophen (NORCO/VICODIN) 5-325 MG tablet Take 1-2 tablets by mouth every 6 (six) hours as needed for moderate pain or severe pain. 10 tablet 0  . magic mouthwash w/lidocaine SOLN Take 5 mLs by mouth 4 (four) times daily as needed for mouth pain.  Swish and  Swallow  Or   Swish and  Spit. 240 mL 0  . ondansetron (ZOFRAN) 8 MG tablet Take 1 tablet (8 mg total) by mouth 2 (two) times daily as needed for refractory nausea / vomiting. Start on day 3 after chemo. 30 tablet 1  . prochlorperazine (COMPAZINE) 10 MG tablet Take 1 tablet (10 mg total) by mouth every 6 (six) hours as needed (Nausea or vomiting). 30 tablet 1  . valsartan (DIOVAN) 160 MG tablet Take 160 mg by mouth daily.    Marland Kitchen zolpidem (AMBIEN) 5 MG tablet Take 1 tablet (5 mg total) by mouth at bedtime as needed for sleep. 30 tablet 0   No current facility-administered medications for this visit.     REVIEW OF SYSTEMS:   Constitutional: Denies fevers,  chills or abnormal night sweats Eyes: Denies blurriness of vision, double vision or watery eyes Ears, nose, mouth, throat, and face: Denies mucositis or sore throat Respiratory: Denies cough, dyspnea or wheezes Cardiovascular: Denies palpitation, chest discomfort or lower extremity swelling Gastrointestinal:  Denies nausea, heartburn or change in bowel habits Skin: Denies abnormal skin rashes Lymphatics: Denies new lymphadenopathy or easy bruising Neurological:Denies numbness, tingling or new weaknesses Behavioral/Psych: Mood is stable, no new changes  All other systems were reviewed with the patient and are negative.  PHYSICAL EXAMINATION: ECOG PERFORMANCE STATUS: 1 - Symptomatic but completely ambulatory  Vitals:   01/13/16 0824  BP: 134/73  Pulse: 99  Resp: 18  Temp: 97.8 F (36.6 C)   Filed Weights   01/13/16 0824  Weight: 273 lb 11.2 oz (124.1 kg)    GENERAL:alert, no distress and comfortable SKIN: skin color, texture, turgor are normal, no rashes or significant lesions, (+) alopecia EYES: normal, conjunctiva are pink and non-injected, sclera clear OROPHARYNX:no exudate, no erythema and lips, buccal mucosa, and tongue normal  NECK: supple, thyroid normal size, non-tender, without nodularity LYMPH:  no palpable  lymphadenopathy in the cervical, axillary or inguinal LUNGS: clear to auscultation and percussion with normal breathing effort HEART: regular rate & rhythm and no murmurs and no lower extremity edema ABDOMEN:abdomen soft, non-tender and normal bowel sounds Musculoskeletal:no cyanosis of digits and no clubbing  PSYCH: alert & oriented x 3 with fluent speech NEURO: no focal motor/sensory deficits Breasts: Breast inspection showed them to be symmetrical with no nipple discharge. The incision in the left breast has healed well, no discharge or skin erythema. Palpation of the breasts and axilla revealed no obvious mass that I could appreciate.  LABORATORY DATA:  I have reviewed the data as listed CBC Latest Ref Rng & Units 01/13/2016 12/30/2015 12/16/2015  WBC 3.9 - 10.3 10e3/uL 18.9(H) 6.0 8.7  Hemoglobin 11.6 - 15.9 g/dL 13.2 14.0 13.2  Hematocrit 34.8 - 46.6 % 39.9 43.4 40.7  Platelets 145 - 400 10e3/uL 359 150 297     CMP Latest Ref Rng & Units 12/30/2015 12/16/2015 11/04/2015  Glucose 70 - 140 mg/dl 103 114 92  BUN 7.0 - 26.0 mg/dL 9.5 16.8 10  Creatinine 0.6 - 1.1 mg/dL 0.8 0.8 0.67  Sodium 136 - 145 mEq/L 137 142 140  Potassium 3.5 - 5.1 mEq/L 4.4 4.1 4.6  Chloride 101 - 111 mmol/L - - 105  CO2 22 - 29 mEq/L '26 28 29  ' Calcium 8.4 - 10.4 mg/dL 9.1 9.3 9.5  Total Protein 6.4 - 8.3 g/dL 6.6 7.2 -  Total Bilirubin 0.20 - 1.20 mg/dL 0.49 0.33 -  Alkaline Phos 40 - 150 U/L 106 120 -  AST 5 - 34 U/L 14 14 -  ALT 0 - 55 U/L 19 14 -    PATHOLOGY REPORT Diagnosis 11/16/2015    1. Breast, lumpectomy, Left w/seed - INVASIVE GRADE 3 DUCTAL CARCINOMA, SPANNING 2.2 CM IN GREATEST DIMENSION. 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 - ASSOCIATED PROMINENT LYMPHOID RESPONSE. - INVASIVE DUCTAL CARCINOMA IS FOCALLY 0.1 TO 0.2 CM AWAY FROM MEDIAL MARGIN BUT MARGINAL SURFACE IS NEGATIVE FOR INVASIVE TUMOR. - DUCTAL CARCINOMA IN SITU IS FOCALLY 0.1 CM AWAY FROM LATERAL MARGIN -  OTHER MARGINS ARE NEGATIVE. - SEE ONCOLOGY TEMPLATE. 2. Lymph node, sentinel, biopsy, Left Axillary - ONE BENIGN LYMPH NODE WITH NO TUMOR SEEN (0/1). Microscopic Comment 1. BREAST, INVASIVE TUMOR, WITH LYMPH NODES PRESENT Specimen, including laterality and lymph node sampling (  sentinel, non-sentinel): Left partial breast with left axillary sentinel lymph node. Procedure: Left breast lumpectomy with left axillary sentinel lymph node biopsy. Histologic type: Invasive ductal carcinoma. Grade: 3. Tubule formation: 3. Nuclear pleomorphism: 2. Mitotic: 3. Tumor size (gross measurement): 2.2 cm. Margins: Invasive, distance to closest margin: Invasive ductal carcinoma is focally 0.1 to 0.2 cm away from medial margin. In-situ, distance to closest margin: Ductal carcinoma in situ is focally 0.1 cm from lateral margin. If margin positive, focally or broadly: Marginal surface is not positive for tumor. Lymphovascular invasion: Definitive lymph/vascular invasion is not identified. Ductal carcinoma in situ: Yes. Grade: Intermediate grade. Extensive intraductal component: No. Lobular neoplasia: Not identified. Tumor focality: Unifocal. Extent of tumor: Tumor confined to breast parenchyma. Skin: Not received. Nipple: Not received. Skeletal muscle: Not received. Lymph nodes: Examined: 1 Sentinel. 0 Non-sentinel. 1 Total. Lymph nodes with metastasis: 0. Isolated tumor cells (< 0.2 mm): 0. Micrometastasis: (> 0.2 mm and < 2.0 mm): 0. Macrometastasis: (> 2.0 mm): 0. Extracapsular extension: Not applicable. Breast prognostic profile: Assessed on previous biopsy ([id number]): Estrogen receptor: 100%, positive. Progesterone receptor: 90%, positive. Her 2 neu: 2+ equivocal staining on immunohistochemical stain. FISH is pending on biopsy, per previous biospy report. Ki-67: 30%. Non-neoplastic breast: Fibrocystic changes. TNM: pT2, pN0. (RH:kh 11-18-15)  Diagnosis 10/27/2015 Consult Slide ,  Left Breast Biopsy - INVASIVE DUCTAL CARCINOMA, SEE COMMENT. - DUCTAL CARCINOMA IN SITU. Microscopic Comment While grading is best performed on the resection specimen the carcinoma appears grade 3. E-cadherin is positive confirming a ductal phenotype. Beta-catenin is positive. p53 exhibits weak staining. ER: Positive, 100%, strong staining intensity. PR: Positive, 90%, strong staining intensity. Ki-67: 30% Her2 IHC: 2+ Equivocal, FISH (-), with average copy number of 2, and a ratio of 1.07.  Oncotype RS 23, intermediate risk, which predicts 10 year risk of distant recurrence 15% with tamoxifen  RADIOGRAPHIC STUDIES: I have personally reviewed the radiological images as listed and agreed with the findings in the report. No results found.  ASSESSMENT & PLAN: 55 year old Caucasian post menopause female, presented with screening discovered left breast cancer  1. 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 -We discussed her Oncotype DX test results. The recurrence score is 23, which is intermedia risk, it protects 10 year of distant recurrence risk of 15% with tamoxifen. The benefit of adjuvant chemotherapy intermediate risk group is uncertain, however given her young age, grade 3 disease, I think adjuvant chemo will probably reduce her risk of recurrence although the benefit is not very high. She is very concerned about cancer recurrence. After lengthy discussion, we decided to proceed with adjuvant chemotherapy -Given her an node negative disease, I recommend docetaxel and Cytoxan every 3 weeks for total 4 cycles. -The goal of therapy is curative. -She had mild to moderate side effects from first cycle chemotherapy, recovered well -Lab review, adequate for  treatment, we'll proceed to cycle 2 TC today. She is willing to continue Neulasta due to the concern of infection.  2. Mucositis, secondary to chemotherapy -Resolved. She knows to use Magic mouth wash as needed   3. HTN -She is on antihypertensive medication, including HCTZ -We discussed the impact of chemotherapy on her blood pressure. We'll monitor her blood pressure closely during the chemotherapy. We may need to hold her HCTZ if her  BP drops after chemo due to decreased po intake.   PLAN -Lab reviewed, we'll proceed to cycle 2 TC today, with Neulasta on day 2 -Urine analysis and urine cultures today -She will return in 3 weeks for cycle 3 chemotherapy  All questions were answered. The patient knows to call the clinic with any problems, questions or concerns. I spent 20 minutes counseling the patient face to face. The total time spent in the appointment was 25 minutes and more than 50% was on counseling.     [name], MD 01/13/2016

## 2016-01-13 NOTE — Patient Instructions (Signed)
[facility] Discharge Instructions for Patients Receiving Chemotherapy  Today you received the following chemotherapy agents Herceptin  To help prevent nausea and vomiting after your treatment, we encourage you to take your nausea medication    If you develop nausea and vomiting that is not controlled by your nausea medication, call the clinic.   BELOW ARE SYMPTOMS THAT SHOULD BE REPORTED IMMEDIATELY:  *FEVER GREATER THAN 100.5 F  *CHILLS WITH OR WITHOUT FEVER  NAUSEA AND VOMITING THAT IS NOT CONTROLLED WITH YOUR NAUSEA MEDICATION  *UNUSUAL SHORTNESS OF BREATH  *UNUSUAL BRUISING OR BLEEDING  TENDERNESS IN MOUTH AND THROAT WITH OR WITHOUT PRESENCE OF ULCERS  *URINARY PROBLEMS  *BOWEL PROBLEMS  UNUSUAL RASH Items with * indicate a potential emergency and should be followed up as soon as possible.  Feel free to call the clinic you have any questions or concerns. The clinic phone number is [phone].  Please show the CHEMO ALERT CARD at check-in to the Emergency Department and triage nurse.   

## 2016-01-13 NOTE — Telephone Encounter (Signed)
Appointment scheduled per 11/16 LOS.

## 2016-01-13 NOTE — Addendum Note (Signed)
Addended by: [name] on: 01/13/2016 08:53 AM   Modules accepted: Orders

## 2016-01-15 LAB — URINE CULTURE

## 2016-01-16 ENCOUNTER — Telehealth: Payer: Self-pay | Admitting: Hematology

## 2016-01-16 ENCOUNTER — Other Ambulatory Visit: Payer: Self-pay | Admitting: Hematology

## 2016-01-16 MED ORDER — CIPROFLOXACIN HCL 500 MG PO TABS: 500.0000 mg | ORAL_TABLET | Freq: Two times a day (BID) | ORAL | 0 refills | Status: DC

## 2016-01-16 NOTE — Telephone Encounter (Signed)
I called pt regarding her positive urine culture from 01/13/2016. I called in cipro 500mg  bid for 5 days, I left a message on her cell phone.   [name]  01/16/2016  1:04 PM

## 2016-01-17 ENCOUNTER — Telehealth: Payer: Self-pay

## 2016-01-17 NOTE — Telephone Encounter (Signed)
Faxed disability papers to [employer]

## 2016-02-02 NOTE — Progress Notes (Signed)
Nicole Schmitt  [phone] [phone]  Clinic Follow up Note   Patient Care Team: Pcp Not In System as PCP - General 02/03/2016   CHIEF COMPLAINTS:  Follow up left breast cancer  Oncology History   Breast cancer of upper-outer quadrant of left female breast [facility])   Staging form: Breast, AJCC 7th Edition   - Pathologic: Stage IIA (T2, N0, cM0) - Unsigned      Breast cancer of upper-outer quadrant of left female breast ([place])   10/20/2015 Mammogram    Screening mammogram showed new nodular density on the left breast, measuring about 2 cm. the well defined nodules bilaterally are otherwise stable.      10/27/2015 Initial Biopsy    Left breast 2:00 position mass biopsy showed infiltrating ductal carcinoma.       10/27/2015 Initial Diagnosis    Breast cancer of upper-outer quadrant of left female breast ([place])      10/27/2015 Receptors her2    ER 100% positive, strong staining, PR 90% positive, strong staining, HER-2 IHC 2+, FISH negative.      11/16/2015 Surgery    Left breast lumpectomy and sentinel lymph node biopsy.      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 Margins were negative. One sentinel lymph node was negative. Lymphovascular invasion was negative.      11/16/2015 Oncotype testing    RS 23, intermediate risk, which predicts 10 year risk of distant recurrence 15% with tamoxifen      12/23/2015 -  Adjuvant Chemotherapy    Docetaxel and Cytoxan, every 3 weeks, with Neulasta on day 2, for 4 cycles       HISTORY OF PRESENTING ILLNESS:  Nicole Schmitt 55 y.o. female is here because of Her recently resected left breast cancer. She presents to the clinic by herself. She was referred by her surgeon Dr. Excell Seltzer.  She lives just outside of [place] but works as an Therapist, sports on the Advertising account planner at [employer]. Her breast cancer was discovered by  screening mammogram, and her initial workup was done in [place].She recently underwent a screening mamogram which revealed a possible mass in the left breast. Subsequent imaging included diagnostic mamogram showing a persistent area of asymmetric density in the left breast and ultrasound showing a 1.8 x 0.7 cm lobulated mass at the 2 o'clock position in the left breast 6 cm from the nipple. An ultrasound guided breast biopsy was performed on October 27, 2015 with pathology revealing invasive ductal carcinoma of the breast. She was referred to (surgeon Dr. Excell Seltzer, and underwent left breast lumpectomy and sentinel lymph node biopsy on 11/16/2015. The surgical pathology showed invasive grade 3 ductal carcinoma, 2.2 cm, one lymph node was negative.   She has been recovering well from her surgery. The pain to this surgical incision is minimal, she does not take much pain medication. No limited range of movement of left shoulder. Her appetite and energy level has been back to normal, she denies any other symptoms.  Her husband was diagnosed with metastatic melanoma in early 2016, has been receiving immunotherapy at [facility]. Her father was also recently diagnosed with CLL, but does not quite treatment. She has been under lot of stress in the past few years.  CURRENT THERAPY: Docetaxel and Cytoxan every 3 weeks, with Neulasta on day 2, started on 12/23/2015.  INTERIM HISTORY: Nicole Schmitt returns for follow-up and cycle 3 chemo.  She had significant bone pain from Neulasta on last cycle chemotherapy, moderate mucositis, fatigue, and low appetite, it lasts more than a week, she finally recovered well last week, starting eating better, and gained the weight back now. She had a low-grade fever with temperature 99.5 at home a few weeks ago, resolved spontaneously. No other new complaint.   MEDICAL HISTORY:  Past Medical History:  Diagnosis Date  . Breast cancer ([place])   . Cancer ([place]) 1990   cervical   .  Hypertension   . Seizures ([place])    > 20 years following fall from horse    SURGICAL HISTORY: Past Surgical History:  Procedure Laterality Date  . BREAST LUMPECTOMY WITH RADIOACTIVE SEED AND SENTINEL LYMPH NODE BIOPSY Left 11/16/2015   Procedure: BREAST LUMPECTOMY WITH RADIOACTIVE SEED AND SENTINEL LYMPH NODE BIOPSY;  Surgeon: Excell Seltzer, MD;  Location: [facility];  Service: General;  Laterality: Left;  . CESAREAN SECTION    . COLOSTOMY    . WISDOM TOOTH EXTRACTION      SOCIAL HISTORY: Social History   Social History  . Marital status: Married    Spouse name: N/A  . Number of children: N/A  . Years of education: N/A   Occupational History  . Not on file.   Social History Main Topics  . Smoking status: Never Smoker  . Smokeless tobacco: Never Used  . Alcohol use Yes     Comment: rare  . Drug use: No  . Sexual activity: Yes   Other Topics Concern  . Not on file   Social History Narrative  . No narrative on file    FAMILY HISTORY: Family History  Problem Relation Age of Onset  . Cancer Maternal Uncle     liver cancer/environmental exposure  . Cancer Paternal Uncle     liver cancer/environmental exposure    ALLERGIES:  has No Known Allergies.  MEDICATIONS:  Current Outpatient Prescriptions  Medication Sig Dispense Refill  . CLINDAGEL 1 % gel Apply 1 application topically daily.     Marland Kitchen dexamethasone (DECADRON) 4 MG tablet Take 2 tablets (8 mg total) by mouth 2 (two) times daily. Start the day before Taxotere. Then again the day after chemo for 3 days. 30 tablet 1  . doxycycline (VIBRAMYCIN) 100 MG capsule Take 100 mg by mouth daily as needed.     . hydrochlorothiazide (HYDRODIURIL) 25 MG tablet Take 25 mg by mouth daily. 12.5 mg to 25 mg dependent on bp medication    . HYDROcodone-acetaminophen (NORCO/VICODIN) 5-325 MG tablet Take 1-2 tablets by mouth every 6 (six) hours as needed for moderate pain or severe pain. 15 tablet 0  . magic  mouthwash w/lidocaine SOLN Take 5 mLs by mouth 4 (four) times daily as needed for mouth pain. Swish and  Swallow  Or   Swish and  Spit. 240 mL 0  . ondansetron (ZOFRAN) 8 MG tablet Take 1 tablet (8 mg total) by mouth 2 (two) times daily as needed for refractory nausea / vomiting. Start on day 3 after chemo. 30 tablet 1  . prochlorperazine (COMPAZINE) 10 MG tablet Take 1 tablet (10 mg total) by mouth every 6 (six) hours as needed (Nausea or vomiting). 30 tablet 1  . valsartan (DIOVAN) 160 MG tablet Take 160 mg by mouth daily.    Marland Kitchen zolpidem (AMBIEN) 5 MG tablet Take 1 tablet (5 mg total) by mouth at bedtime as needed for sleep. 30 tablet 0   No current facility-administered medications for  this visit.     REVIEW OF SYSTEMS:   Constitutional: Denies fevers, chills or abnormal night sweats Eyes: Denies blurriness of vision, double vision or watery eyes Ears, nose, mouth, throat, and face: Denies mucositis or sore throat Respiratory: Denies cough, dyspnea or wheezes Cardiovascular: Denies palpitation, chest discomfort or lower extremity swelling Gastrointestinal:  Denies nausea, heartburn or change in bowel habits Skin: Denies abnormal skin rashes Lymphatics: Denies new lymphadenopathy or easy bruising Neurological:Denies numbness, tingling or new weaknesses Behavioral/Psych: Mood is stable, no new changes  All other systems were reviewed with the patient and are negative.  PHYSICAL EXAMINATION: ECOG PERFORMANCE STATUS: 1 - Symptomatic but completely ambulatory  Vitals:   02/03/16 0905 02/03/16 0909  BP: 137/77 137/77  Pulse: 96 96  Resp: 18 18  Temp: 97.7 F (36.5 C) 97.7 F (36.5 C)   Filed Weights   02/03/16 0905 02/03/16 0909  Weight: 271 lb 4.8 oz (123.1 kg) 271 lb 4.8 oz (123.1 kg)    GENERAL:alert, no distress and comfortable SKIN: skin color, texture, turgor are normal, no rashes or significant lesions, (+) alopecia EYES: normal, conjunctiva are pink and non-injected,  sclera clear OROPHARYNX:no exudate, no erythema and lips, buccal mucosa, and tongue normal  NECK: supple, thyroid normal size, non-tender, without nodularity LYMPH:  no palpable lymphadenopathy in the cervical, axillary or inguinal LUNGS: clear to auscultation and percussion with normal breathing effort HEART: regular rate & rhythm and no murmurs and no lower extremity edema ABDOMEN:abdomen soft, non-tender and normal bowel sounds Musculoskeletal:no cyanosis of digits and no clubbing  PSYCH: alert & oriented x 3 with fluent speech NEURO: no focal motor/sensory deficits Breasts: Breast inspection showed them to be symmetrical with no nipple discharge. The incision in the left breast has healed well, no discharge or skin erythema. Palpation of the breasts and axilla revealed no obvious mass that I could appreciate.  LABORATORY DATA:  I have reviewed the data as listed CBC Latest Ref Rng & Units 02/03/2016 01/13/2016 12/30/2015  WBC 3.9 - 10.3 10e3/uL 16.2(H) 18.9(H) 6.0  Hemoglobin 11.6 - 15.9 g/dL 12.1 13.2 14.0  Hematocrit 34.8 - 46.6 % 36.3 39.9 43.4  Platelets 145 - 400 10e3/uL 387 359 150     CMP Latest Ref Rng & Units 02/03/2016 01/13/2016 12/30/2015  Glucose 70 - 140 mg/dl 208(H) 241(H) 103  BUN 7.0 - 26.0 mg/dL 16.8 14.7 9.5  Creatinine 0.6 - 1.1 mg/dL 0.8 0.8 0.8  Sodium 136 - 145 mEq/L 141 140 137  Potassium 3.5 - 5.1 mEq/L 3.9 3.9 4.4  Chloride 101 - 111 mmol/L - - -  CO2 22 - 29 mEq/L 18(L) 17(L) 26  Calcium 8.4 - 10.4 mg/dL 9.9 9.8 9.1  Total Protein 6.4 - 8.3 g/dL 7.3 7.3 6.6  Total Bilirubin 0.20 - 1.20 mg/dL 0.25 0.32 0.49  Alkaline Phos 40 - 150 U/L 115 116 106  AST 5 - 34 U/L '9 13 14  ' ALT 0 - 55 U/L '15 23 19    ' PATHOLOGY REPORT Diagnosis 11/16/2015    1. Breast, lumpectomy, Left w/seed - INVASIVE GRADE 3 DUCTAL CARCINOMA, SPANNING 2.2 CM IN GREATEST DIMENSION. 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 - ASSOCIATED PROMINENT LYMPHOID RESPONSE. -  INVASIVE DUCTAL CARCINOMA IS FOCALLY 0.1 TO 0.2 CM AWAY FROM MEDIAL MARGIN BUT MARGINAL SURFACE IS NEGATIVE FOR INVASIVE TUMOR. - DUCTAL CARCINOMA IN SITU IS FOCALLY 0.1 CM AWAY FROM LATERAL MARGIN - OTHER MARGINS ARE NEGATIVE. - SEE ONCOLOGY TEMPLATE. 2. Lymph node,  sentinel, biopsy, Left Axillary - ONE BENIGN LYMPH NODE WITH NO TUMOR SEEN (0/1). Microscopic Comment 1. BREAST, INVASIVE TUMOR, WITH LYMPH NODES PRESENT Specimen, including laterality and lymph node sampling (sentinel, non-sentinel): Left partial breast with left axillary sentinel lymph node. Procedure: Left breast lumpectomy with left axillary sentinel lymph node biopsy. Histologic type: Invasive ductal carcinoma. Grade: 3. Tubule formation: 3. Nuclear pleomorphism: 2. Mitotic: 3. Tumor size (gross measurement): 2.2 cm. Margins: Invasive, distance to closest margin: Invasive ductal carcinoma is focally 0.1 to 0.2 cm away from medial margin. In-situ, distance to closest margin: Ductal carcinoma in situ is focally 0.1 cm from lateral margin. If margin positive, focally or broadly: Marginal surface is not positive for tumor. Lymphovascular invasion: Definitive lymph/vascular invasion is not identified. Ductal carcinoma in situ: Yes. Grade: Intermediate grade. Extensive intraductal component: No. Lobular neoplasia: Not identified. Tumor focality: Unifocal. Extent of tumor: Tumor confined to breast parenchyma. Skin: Not received. Nipple: Not received. Skeletal muscle: Not received. Lymph nodes: Examined: 1 Sentinel. 0 Non-sentinel. 1 Total. Lymph nodes with metastasis: 0. Isolated tumor cells (< 0.2 mm): 0. Micrometastasis: (> 0.2 mm and < 2.0 mm): 0. Macrometastasis: (> 2.0 mm): 0. Extracapsular extension: Not applicable. Breast prognostic profile: Assessed on previous biopsy ([id number]): Estrogen receptor: 100%, positive. Progesterone receptor: 90%, positive. Her 2 neu: 2+ equivocal staining on  immunohistochemical stain. FISH is pending on biopsy, per previous biospy report. Ki-67: 30%. Non-neoplastic breast: Fibrocystic changes. TNM: pT2, pN0. (RH:kh 11-18-15)  Diagnosis 10/27/2015 Consult Slide , Left Breast Biopsy - INVASIVE DUCTAL CARCINOMA, SEE COMMENT. - DUCTAL CARCINOMA IN SITU. Microscopic Comment While grading is best performed on the resection specimen the carcinoma appears grade 3. E-cadherin is positive confirming a ductal phenotype. Beta-catenin is positive. p53 exhibits weak staining. ER: Positive, 100%, strong staining intensity. PR: Positive, 90%, strong staining intensity. Ki-67: 30% Her2 IHC: 2+ Equivocal, FISH (-), with average copy number of 2, and a ratio of 1.07.  Oncotype RS 23, intermediate risk, which predicts 10 year risk of distant recurrence 15% with tamoxifen  RADIOGRAPHIC STUDIES: I have personally reviewed the radiological images as listed and agreed with the findings in the report. No results found.  ASSESSMENT & PLAN: 55 year old Caucasian post menopause female, presented with screening discovered left breast cancer  1. 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 -We discussed her Oncotype DX test results. The recurrence score is 23, which is intermedia risk, it protects 10 year of distant recurrence risk of 15% with tamoxifen. The benefit of adjuvant chemotherapy intermediate risk group is uncertain, however given her young age, grade 3 disease, I think adjuvant chemo will probably reduce her risk of recurrence although the benefit is not very high. She is very concerned about cancer recurrence. After lengthy discussion, we decided to proceed with adjuvant chemotherapy -Given her node negative disease, I recommend  docetaxel and Cytoxan every 3 weeks for total 4 cycles. -The goal of therapy is curative. -She had mild to moderate side effects from first cycle chemotherapy, recovered well -Lab review, adequate for treatment, we'll proceed to cycle 3 TC today.  -Due to significant bone pain from Neulasta, and leukocytosis, I'll hold her Neulasta this cycle.  2. Mucositis, secondary to chemotherapy -Resolved. She knows to use Magic mouth wash as needed   3. HTN -She  is on antihypertensive medication, including HCTZ -We discussed the impact of chemotherapy on her blood pressure. We'll monitor her blood pressure closely during the chemotherapy. We may need to hold her HCTZ if her BP drops after chemo due to decreased po intake.   4. Bone pain  -Secondary to Neulasta  -I reviewed her hydrocodone  As needed  PLAN -Lab reviewed, we'll proceed to cycle 3 TC today, no Neulasta on day 2 -She will return in 3 weeks for cycle 4 chemotherapy  All questions were answered. The patient knows to call the clinic with any problems, questions or concerns. I spent 20 minutes counseling the patient face to face. The total time spent in the appointment was 25 minutes and more than 50% was on counseling.     [name], MD 02/03/2016

## 2016-02-03 ENCOUNTER — Other Ambulatory Visit (HOSPITAL_BASED_OUTPATIENT_CLINIC_OR_DEPARTMENT_OTHER): Payer: BLUE CROSS/BLUE SHIELD

## 2016-02-03 ENCOUNTER — Encounter: Payer: Self-pay | Admitting: *Deleted

## 2016-02-03 ENCOUNTER — Encounter: Payer: Self-pay | Admitting: Hematology

## 2016-02-03 ENCOUNTER — Ambulatory Visit (HOSPITAL_BASED_OUTPATIENT_CLINIC_OR_DEPARTMENT_OTHER): Payer: BLUE CROSS/BLUE SHIELD

## 2016-02-03 ENCOUNTER — Ambulatory Visit (HOSPITAL_BASED_OUTPATIENT_CLINIC_OR_DEPARTMENT_OTHER): Payer: BLUE CROSS/BLUE SHIELD | Admitting: Hematology

## 2016-02-03 ENCOUNTER — Telehealth: Payer: Self-pay | Admitting: Hematology

## 2016-02-03 ENCOUNTER — Telehealth: Payer: Self-pay | Admitting: *Deleted

## 2016-02-03 VITALS — BP 137/77 | HR 96 | Temp 97.7°F | Resp 18 | Ht 65.0 in | Wt 271.3 lb

## 2016-02-03 DIAGNOSIS — K1231 Oral mucositis (ulcerative) due to antineoplastic therapy: Secondary | ICD-10-CM

## 2016-02-03 DIAGNOSIS — C50412 Malignant neoplasm of upper-outer quadrant of left female breast: Secondary | ICD-10-CM

## 2016-02-03 DIAGNOSIS — Z170 Estrogen receptor positive status [ER+]: Principal | ICD-10-CM

## 2016-02-03 DIAGNOSIS — I10 Essential (primary) hypertension: Secondary | ICD-10-CM

## 2016-02-03 DIAGNOSIS — Z5111 Encounter for antineoplastic chemotherapy: Secondary | ICD-10-CM

## 2016-02-03 LAB — CBC WITH DIFFERENTIAL/PLATELET
BASO%: 0 % (ref 0.0–2.0)
BASOS ABS: 0 10*3/uL (ref 0.0–0.1)
EOS%: 0 % (ref 0.0–7.0)
Eosinophils Absolute: 0 10*3/uL (ref 0.0–0.5)
HEMATOCRIT: 36.3 % (ref 34.8–46.6)
HGB: 12.1 g/dL (ref 11.6–15.9)
LYMPH%: 9.8 % — ABNORMAL LOW (ref 14.0–49.7)
MCH: 29.1 pg (ref 25.1–34.0)
MCHC: 33.3 g/dL (ref 31.5–36.0)
MCV: 87.3 fL (ref 79.5–101.0)
MONO#: 0.4 10*3/uL (ref 0.1–0.9)
MONO%: 2.4 % (ref 0.0–14.0)
NEUT%: 87.8 % — ABNORMAL HIGH (ref 38.4–76.8)
NEUTROS ABS: 14.2 10*3/uL — AB (ref 1.5–6.5)
PLATELETS: 387 10*3/uL (ref 145–400)
RBC: 4.16 10*6/uL (ref 3.70–5.45)
RDW: 15.3 % — AB (ref 11.2–14.5)
WBC: 16.2 10*3/uL — ABNORMAL HIGH (ref 3.9–10.3)
lymph#: 1.6 10*3/uL (ref 0.9–3.3)

## 2016-02-03 LAB — COMPREHENSIVE METABOLIC PANEL
ALBUMIN: 3.1 g/dL — AB (ref 3.5–5.0)
ALK PHOS: 115 U/L (ref 40–150)
ALT: 15 U/L (ref 0–55)
ANION GAP: 14 meq/L — AB (ref 3–11)
AST: 9 U/L (ref 5–34)
BILIRUBIN TOTAL: 0.25 mg/dL (ref 0.20–1.20)
BUN: 16.8 mg/dL (ref 7.0–26.0)
CALCIUM: 9.9 mg/dL (ref 8.4–10.4)
CO2: 18 mEq/L — ABNORMAL LOW (ref 22–29)
CREATININE: 0.8 mg/dL (ref 0.6–1.1)
Chloride: 110 mEq/L — ABNORMAL HIGH (ref 98–109)
EGFR: 88 mL/min/{1.73_m2} — AB (ref >90)
Glucose: 208 mg/dl — ABNORMAL HIGH (ref 70–140)
Potassium: 3.9 mEq/L (ref 3.5–5.1)
Sodium: 141 mEq/L (ref 136–145)
TOTAL PROTEIN: 7.3 g/dL (ref 6.4–8.3)

## 2016-02-03 MED ORDER — SODIUM CHLORIDE 0.9 % IV SOLN
Freq: Once | INTRAVENOUS | Status: AC
  Administered 2016-02-03: 10:00:00 via INTRAVENOUS

## 2016-02-03 MED ORDER — PALONOSETRON HCL INJECTION 0.25 MG/5ML
0.2500 mg | Freq: Once | INTRAVENOUS | Status: AC
  Administered 2016-02-03: 0.25 mg via INTRAVENOUS

## 2016-02-03 MED ORDER — DEXAMETHASONE SODIUM PHOSPHATE 10 MG/ML IJ SOLN
INTRAMUSCULAR | Status: AC
  Filled 2016-02-03: qty 1

## 2016-02-03 MED ORDER — DEXAMETHASONE SODIUM PHOSPHATE 10 MG/ML IJ SOLN
10.0000 mg | Freq: Once | INTRAMUSCULAR | Status: AC
  Administered 2016-02-03: 10 mg via INTRAVENOUS

## 2016-02-03 MED ORDER — HYDROCODONE-ACETAMINOPHEN 5-325 MG PO TABS: 1.0000 | ORAL_TABLET | Freq: Four times a day (QID) | ORAL | 0 refills | Status: DC | PRN

## 2016-02-03 MED ORDER — SODIUM CHLORIDE 0.9 % IV SOLN
600.0000 mg/m2 | Freq: Once | INTRAVENOUS | Status: AC
  Administered 2016-02-03: 1420 mg via INTRAVENOUS
  Filled 2016-02-03: qty 71

## 2016-02-03 MED ORDER — DOCETAXEL CHEMO INJECTION 160 MG/16ML
75.0000 mg/m2 | Freq: Once | INTRAVENOUS | Status: AC
  Administered 2016-02-03: 180 mg via INTRAVENOUS
  Filled 2016-02-03: qty 18

## 2016-02-03 MED ORDER — PALONOSETRON HCL INJECTION 0.25 MG/5ML
INTRAVENOUS | Status: AC
  Filled 2016-02-03: qty 5

## 2016-02-03 NOTE — Patient Instructions (Signed)
Cancer Center Discharge Instructions for Patients Receiving Chemotherapy  Today you received the following chemotherapy agents Taxotere and Cytoxan.  To help prevent nausea and vomiting after your treatment, we encourage you to take your nausea medication.   If you develop nausea and vomiting that is not controlled by your nausea medication, call the clinic.   BELOW ARE SYMPTOMS THAT SHOULD BE REPORTED IMMEDIATELY:  *FEVER GREATER THAN 100.5 F  *CHILLS WITH OR WITHOUT FEVER  NAUSEA AND VOMITING THAT IS NOT CONTROLLED WITH YOUR NAUSEA MEDICATION  *UNUSUAL SHORTNESS OF BREATH  *UNUSUAL BRUISING OR BLEEDING  TENDERNESS IN MOUTH AND THROAT WITH OR WITHOUT PRESENCE OF ULCERS  *URINARY PROBLEMS  *BOWEL PROBLEMS  UNUSUAL RASH Items with * indicate a potential emergency and should be followed up as soon as possible.  Feel free to call the clinic you have any questions or concerns. The clinic phone number is [phone].  Please show the CHEMO ALERT CARD at check-in to the Emergency Department and triage nurse.   

## 2016-02-03 NOTE — Telephone Encounter (Signed)
Appointments scheduled per 12/7 LOS. Patient given AVS report and calendars with future scheduled appointments.  °

## 2016-02-03 NOTE — Telephone Encounter (Signed)
Per LOS I have scheduled appts and notified the scheduler 

## 2016-02-08 ENCOUNTER — Telehealth: Payer: Self-pay | Admitting: *Deleted

## 2016-02-08 ENCOUNTER — Other Ambulatory Visit: Payer: Self-pay | Admitting: *Deleted

## 2016-02-08 DIAGNOSIS — I10 Essential (primary) hypertension: Secondary | ICD-10-CM

## 2016-02-08 DIAGNOSIS — Z170 Estrogen receptor positive status [ER+]: Principal | ICD-10-CM

## 2016-02-08 DIAGNOSIS — C50412 Malignant neoplasm of upper-outer quadrant of left female breast: Secondary | ICD-10-CM

## 2016-02-08 MED ORDER — VALSARTAN 160 MG PO TABS: 160.0000 mg | ORAL_TABLET | Freq: Every day | ORAL | 0 refills | Status: DC

## 2016-02-08 NOTE — Telephone Encounter (Signed)
Received call from patient stating she is worried about her blood counts since she did not receive the neulasta last treatment.  Informed her I could make her a lab appointment for this week to check them.  Confirmed appt. For 02/10/16 at 845am and then she will see me after to evaluate her counts.  She also states she is having trouble with her PCP calling in her blood pressure medication.  She has called them several times and they have not called her medication in.  She thinks they may want to see her first she hasn't been there since February but is willing to go just not right now while she is not feeling good from chemo. She wants to know if Dr. Burr Medico will due a courtesy refill for her.    Per Dr. Burr Medico ok to fill once.  I will call into her pharmacy.

## 2016-02-10 ENCOUNTER — Other Ambulatory Visit (HOSPITAL_BASED_OUTPATIENT_CLINIC_OR_DEPARTMENT_OTHER): Payer: BLUE CROSS/BLUE SHIELD

## 2016-02-10 ENCOUNTER — Ambulatory Visit (HOSPITAL_BASED_OUTPATIENT_CLINIC_OR_DEPARTMENT_OTHER): Payer: BLUE CROSS/BLUE SHIELD

## 2016-02-10 ENCOUNTER — Encounter: Payer: Self-pay | Admitting: *Deleted

## 2016-02-10 ENCOUNTER — Other Ambulatory Visit: Payer: Self-pay | Admitting: Hematology

## 2016-02-10 VITALS — BP 116/75 | HR 89 | Temp 99.6°F | Resp 18

## 2016-02-10 DIAGNOSIS — C50412 Malignant neoplasm of upper-outer quadrant of left female breast: Secondary | ICD-10-CM

## 2016-02-10 DIAGNOSIS — Z170 Estrogen receptor positive status [ER+]: Principal | ICD-10-CM

## 2016-02-10 DIAGNOSIS — K1231 Oral mucositis (ulcerative) due to antineoplastic therapy: Secondary | ICD-10-CM | POA: Diagnosis not present

## 2016-02-10 LAB — COMPREHENSIVE METABOLIC PANEL
ALT: 25 U/L (ref 0–55)
ANION GAP: 11 meq/L (ref 3–11)
AST: 20 U/L (ref 5–34)
Albumin: 3.2 g/dL — ABNORMAL LOW (ref 3.5–5.0)
Alkaline Phosphatase: 105 U/L (ref 40–150)
BUN: 16 mg/dL (ref 7.0–26.0)
CO2: 24 meq/L (ref 22–29)
Calcium: 9.6 mg/dL (ref 8.4–10.4)
Chloride: 100 mEq/L (ref 98–109)
Creatinine: 0.8 mg/dL (ref 0.6–1.1)
EGFR: 80 mL/min/{1.73_m2} — AB (ref >90)
GLUCOSE: 100 mg/dL (ref 70–140)
POTASSIUM: 4.2 meq/L (ref 3.5–5.1)
SODIUM: 134 meq/L — AB (ref 136–145)
Total Bilirubin: 0.87 mg/dL (ref 0.20–1.20)
Total Protein: 6.6 g/dL (ref 6.4–8.3)

## 2016-02-10 LAB — CBC WITH DIFFERENTIAL/PLATELET
BASO%: 1 % (ref 0.0–2.0)
BASOS ABS: 0 10*3/uL (ref 0.0–0.1)
EOS%: 1.9 % (ref 0.0–7.0)
Eosinophils Absolute: 0 10*3/uL (ref 0.0–0.5)
HCT: 37.2 % (ref 34.8–46.6)
HGB: 12.7 g/dL (ref 11.6–15.9)
LYMPH%: 81 % — AB (ref 14.0–49.7)
MCH: 29.1 pg (ref 25.1–34.0)
MCHC: 34.1 g/dL (ref 31.5–36.0)
MCV: 85.1 fL (ref 79.5–101.0)
MONO#: 0.1 10*3/uL (ref 0.1–0.9)
MONO%: 7.6 % (ref 0.0–14.0)
NEUT#: 0.1 10*3/uL — CL (ref 1.5–6.5)
NEUT%: 8.5 % — AB (ref 38.4–76.8)
Platelets: 312 10*3/uL (ref 145–400)
RBC: 4.37 10*6/uL (ref 3.70–5.45)
RDW: 14.6 % — AB (ref 11.2–14.5)
WBC: 1.1 10*3/uL — ABNORMAL LOW (ref 3.9–10.3)
lymph#: 0.9 10*3/uL (ref 0.9–3.3)
nRBC: 0 % (ref 0–0)

## 2016-02-10 MED ORDER — SODIUM CHLORIDE 0.9 % IV SOLN
Freq: Once | INTRAVENOUS | Status: AC
  Administered 2016-02-10: 11:00:00 via INTRAVENOUS

## 2016-02-10 MED ORDER — SODIUM CHLORIDE 0.9 % IV SOLN: Freq: Once | INTRAVENOUS | Status: DC

## 2016-02-10 MED ORDER — ONDANSETRON HCL 4 MG/2ML IJ SOLN
8.0000 mg | Freq: Once | INTRAMUSCULAR | Status: AC
  Administered 2016-02-10: 8 mg via INTRAVENOUS

## 2016-02-10 MED ORDER — ONDANSETRON HCL 4 MG/2ML IJ SOLN
INTRAMUSCULAR | Status: AC
  Filled 2016-02-10: qty 4

## 2016-02-10 NOTE — Patient Instructions (Signed)
Dehydration, Adult Dehydration is a condition in which there is not enough fluid or water in the body. This happens when you lose more fluids than you take in. Important organs, such as the kidneys, brain, and heart, cannot function without a proper amount of fluids. Any loss of fluids from the body can lead to dehydration. Dehydration can range from mild to severe. This condition should be treated right away to prevent it from becoming severe. What are the causes? This condition may be caused by:  Vomiting.  Diarrhea.  Excessive sweating, such as from heat exposure or exercise.  Not drinking enough fluid, especially:  When ill.  While doing activity that requires a lot of energy.  Excessive urination.  Fever.  Infection.  Certain medicines, such as medicines that cause the body to lose excess fluid (diuretics).  Inability to access safe drinking water.  Reduced physical ability to get adequate water and food. What increases the risk? This condition is more likely to develop in people:  Who have a poorly controlled long-term (chronic) illness, such as diabetes, heart disease, or kidney disease.  Who are age 65 or older.  Who are disabled.  Who live in a place with high altitude.  Who play endurance sports. What are the signs or symptoms? Symptoms of mild dehydration may include:   Thirst.  Dry lips.  Slightly dry mouth.  Dry, warm skin.  Dizziness. Symptoms of moderate dehydration may include:   Very dry mouth.  Muscle cramps.  Dark urine. Urine may be the color of tea.  Decreased urine production.  Decreased tear production.  Heartbeat that is irregular or faster than normal (palpitations).  Headache.  Light-headedness, especially when you stand up from a sitting position.  Fainting (syncope). Symptoms of severe dehydration may include:   Changes in skin, such as:  Cold and clammy skin.  Blotchy (mottled) or pale skin.  Skin that does  not quickly return to normal after being lightly pinched and released (poor skin turgor).  Changes in body fluids, such as:  Extreme thirst.  No tear production.  Inability to sweat when body temperature is high, such as in hot weather.  Very little urine production.  Changes in vital signs, such as:  Weak pulse.  Pulse that is more than 100 beats a minute when sitting still.  Rapid breathing.  Low blood pressure.  Other changes, such as:  Sunken eyes.  Cold hands and feet.  Confusion.  Lack of energy (lethargy).  Difficulty waking up from sleep.  Short-term weight loss.  Unconsciousness. How is this diagnosed? This condition is diagnosed based on your symptoms and a physical exam. Blood and urine tests may be done to help confirm the diagnosis. How is this treated? Treatment for this condition depends on the severity. Mild or moderate dehydration can often be treated at home. Treatment should be started right away. Do not wait until dehydration becomes severe. Severe dehydration is an emergency and it needs to be treated in a hospital. Treatment for mild dehydration may include:   Drinking more fluids.  Replacing salts and minerals in your blood (electrolytes) that you may have lost. Treatment for moderate dehydration may include:   Drinking an oral rehydration solution (ORS). This is a drink that helps you replace fluids and electrolytes (rehydrate). It can be found at pharmacies and retail stores. Treatment for severe dehydration may include:   Receiving fluids through an IV tube.  Receiving an electrolyte solution through a feeding tube that is   passed through your nose and into your stomach (nasogastric tube, or NG tube).  Correcting any abnormalities in electrolytes.  Treating the underlying cause of dehydration. Follow these instructions at home:  If directed by your health care provider, drink an ORS:  Make an ORS by following instructions on the  package.  Start by drinking small amounts, about  cup (120 mL) every 5-10 minutes.  Slowly increase how much you drink until you have taken the amount recommended by your health care provider.  Drink enough clear fluid to keep your urine clear or pale yellow. If you were told to drink an ORS, finish the ORS first, then start slowly drinking other clear fluids. Drink fluids such as:  Water. Do not drink only water. Doing that can lead to having too little salt (sodium) in the body (hyponatremia).  Ice chips.  Fruit juice that you have added water to (diluted fruit juice).  Low-calorie sports drinks.  Avoid:  Alcohol.  Drinks that contain a lot of sugar. These include high-calorie sports drinks, fruit juice that is not diluted, and soda.  Caffeine.  Foods that are greasy or contain a lot of fat or sugar.  Take over-the-counter and prescription medicines only as told by your health care provider.  Do not take sodium tablets. This can lead to having too much sodium in the body (hypernatremia).  Eat foods that contain a healthy balance of electrolytes, such as bananas, oranges, potatoes, tomatoes, and spinach.  Keep all follow-up visits as told by your health care provider. This is important. Contact a health care provider if:  You have abdominal pain that:  Gets worse.  Stays in one area (localizes).  You have a rash.  You have a stiff neck.  You are more irritable than usual.  You are sleepier or more difficult to wake up than usual.  You feel weak or dizzy.  You feel very thirsty.  You have urinated only a small amount of very dark urine over 6-8 hours. Get help right away if:  You have symptoms of severe dehydration.  You cannot drink fluids without vomiting.  Your symptoms get worse with treatment.  You have a fever.  You have a severe headache.  You have vomiting or diarrhea that:  Gets worse.  Does not go away.  You have blood or green matter  (bile) in your vomit.  You have blood in your stool. This may cause stool to look black and tarry.  You have not urinated in 6-8 hours.  You faint.  Your heart rate while sitting still is over 100 beats a minute.  You have trouble breathing. This information is not intended to replace advice given to you by your health care provider. Make sure you discuss any questions you have with your health care provider. Document Released: 02/13/2005 Document Revised: 09/10/2015 Document Reviewed: 04/09/2015 Elsevier Interactive Patient Education  2017 [employer] Inc.  

## 2016-02-10 NOTE — Progress Notes (Signed)
Patient here for lab only for nadir check.  Patient states she is not feeling well today.  Her ANC is 0.1 today.  She states she has not been drinking/eating well and has had some nausea.  No fever today but did have 99.5 last night.  Per Dr. Burr Medico patient will get some IVF's plus nausea medication and possibly start neupogen.  Appointment arranged and patient aware.

## 2016-02-12 ENCOUNTER — Inpatient Hospital Stay (HOSPITAL_COMMUNITY)
Admission: EM | Admit: 2016-02-12 | Discharge: 2016-02-15 | DRG: 194 | Disposition: A | Discharge status: Home / Self Care | Payer: BLUE CROSS/BLUE SHIELD | Attending: Internal Medicine | Admitting: Internal Medicine

## 2016-02-12 ENCOUNTER — Encounter (HOSPITAL_COMMUNITY): Payer: Self-pay | Admitting: Emergency Medicine

## 2016-02-12 ENCOUNTER — Emergency Department (HOSPITAL_COMMUNITY): Payer: BLUE CROSS/BLUE SHIELD

## 2016-02-12 DIAGNOSIS — J181 Lobar pneumonia, unspecified organism: Principal | ICD-10-CM | POA: Diagnosis present

## 2016-02-12 DIAGNOSIS — Z6841 Body Mass Index (BMI) 40.0 and over, adult: Secondary | ICD-10-CM | POA: Diagnosis not present

## 2016-02-12 DIAGNOSIS — Z79899 Other long term (current) drug therapy: Secondary | ICD-10-CM | POA: Diagnosis not present

## 2016-02-12 DIAGNOSIS — Z800 Family history of malignant neoplasm of digestive organs: Secondary | ICD-10-CM | POA: Diagnosis not present

## 2016-02-12 DIAGNOSIS — E876 Hypokalemia: Secondary | ICD-10-CM | POA: Diagnosis present

## 2016-02-12 DIAGNOSIS — J189 Pneumonia, unspecified organism: Secondary | ICD-10-CM | POA: Diagnosis not present

## 2016-02-12 DIAGNOSIS — K1230 Oral mucositis (ulcerative), unspecified: Secondary | ICD-10-CM | POA: Diagnosis present

## 2016-02-12 DIAGNOSIS — Z9221 Personal history of antineoplastic chemotherapy: Secondary | ICD-10-CM | POA: Diagnosis not present

## 2016-02-12 DIAGNOSIS — Y95 Nosocomial condition: Secondary | ICD-10-CM | POA: Diagnosis present

## 2016-02-12 DIAGNOSIS — C50412 Malignant neoplasm of upper-outer quadrant of left female breast: Secondary | ICD-10-CM | POA: Diagnosis present

## 2016-02-12 DIAGNOSIS — D709 Neutropenia, unspecified: Secondary | ICD-10-CM | POA: Diagnosis present

## 2016-02-12 DIAGNOSIS — I10 Essential (primary) hypertension: Secondary | ICD-10-CM | POA: Diagnosis present

## 2016-02-12 DIAGNOSIS — R7881 Bacteremia: Secondary | ICD-10-CM | POA: Diagnosis not present

## 2016-02-12 DIAGNOSIS — A4902 Methicillin resistant Staphylococcus aureus infection, unspecified site: Secondary | ICD-10-CM | POA: Diagnosis not present

## 2016-02-12 DIAGNOSIS — L02411 Cutaneous abscess of right axilla: Secondary | ICD-10-CM | POA: Diagnosis present

## 2016-02-12 DIAGNOSIS — Z170 Estrogen receptor positive status [ER+]: Secondary | ICD-10-CM

## 2016-02-12 DIAGNOSIS — R5081 Fever presenting with conditions classified elsewhere: Secondary | ICD-10-CM | POA: Diagnosis present

## 2016-02-12 DIAGNOSIS — D701 Agranulocytosis secondary to cancer chemotherapy: Secondary | ICD-10-CM | POA: Diagnosis not present

## 2016-02-12 LAB — COMPREHENSIVE METABOLIC PANEL
ALK PHOS: 81 U/L (ref 38–126)
ALT: 23 U/L (ref 14–54)
AST: 22 U/L (ref 15–41)
Albumin: 3.2 g/dL — ABNORMAL LOW (ref 3.5–5.0)
Anion gap: 9 (ref 5–15)
BUN: 10 mg/dL (ref 6–20)
CALCIUM: 8.3 mg/dL — AB (ref 8.9–10.3)
CO2: 24 mmol/L (ref 22–32)
CREATININE: 0.86 mg/dL (ref 0.44–1.00)
Chloride: 101 mmol/L (ref 101–111)
Glucose, Bld: 121 mg/dL — ABNORMAL HIGH (ref 65–99)
Potassium: 3.5 mmol/L (ref 3.5–5.1)
SODIUM: 134 mmol/L — AB (ref 135–145)
TOTAL PROTEIN: 6.3 g/dL — AB (ref 6.5–8.1)
Total Bilirubin: 0.8 mg/dL (ref 0.3–1.2)

## 2016-02-12 LAB — CBC WITH DIFFERENTIAL/PLATELET
BASOS PCT: 3 %
Basophils Absolute: 0 10*3/uL (ref 0.0–0.1)
EOS ABS: 0 10*3/uL (ref 0.0–0.7)
EOS PCT: 0 %
HCT: 32.7 % — ABNORMAL LOW (ref 36.0–46.0)
HEMOGLOBIN: 11.2 g/dL — AB (ref 12.0–15.0)
LYMPHS ABS: 0.9 10*3/uL (ref 0.7–4.0)
Lymphocytes Relative: 52 %
MCH: 29.3 pg (ref 26.0–34.0)
MCHC: 34.3 g/dL (ref 30.0–36.0)
MCV: 85.6 fL (ref 78.0–100.0)
MONO ABS: 0.7 10*3/uL (ref 0.1–1.0)
Monocytes Relative: 45 %
NEUTROS ABS: 0 10*3/uL — AB (ref 1.7–7.7)
NEUTROS PCT: 0 %
Platelets: 283 10*3/uL (ref 150–400)
RBC: 3.82 MIL/uL — ABNORMAL LOW (ref 3.87–5.11)
RDW: 14.7 % (ref 11.5–15.5)
WBC: 1.6 10*3/uL — ABNORMAL LOW (ref 4.0–10.5)

## 2016-02-12 LAB — URINALYSIS, ROUTINE W REFLEX MICROSCOPIC
Bilirubin Urine: NEGATIVE
GLUCOSE, UA: NEGATIVE mg/dL
Hgb urine dipstick: NEGATIVE
Ketones, ur: NEGATIVE mg/dL
LEUKOCYTES UA: NEGATIVE
Nitrite: NEGATIVE
PROTEIN: NEGATIVE mg/dL
SPECIFIC GRAVITY, URINE: 1.021 (ref 1.005–1.030)
pH: 5 (ref 5.0–8.0)

## 2016-02-12 LAB — I-STAT BETA HCG BLOOD, ED (MC, WL, AP ONLY)

## 2016-02-12 LAB — PROTIME-INR
INR: 1.04
Prothrombin Time: 13.6 seconds (ref 11.4–15.2)

## 2016-02-12 LAB — I-STAT CG4 LACTIC ACID, ED: LACTIC ACID, VENOUS: 2.1 mmol/L — AB (ref 0.5–1.9)

## 2016-02-12 LAB — LACTIC ACID, PLASMA: LACTIC ACID, VENOUS: 1.1 mmol/L (ref 0.5–1.9)

## 2016-02-12 MED ORDER — VANCOMYCIN HCL 10 G IV SOLR
2500.0000 mg | Freq: Once | INTRAVENOUS | Status: AC
  Administered 2016-02-12: 2500 mg via INTRAVENOUS
  Filled 2016-02-12: qty 2000

## 2016-02-12 MED ORDER — ENOXAPARIN SODIUM 60 MG/0.6ML ~~LOC~~ SOLN
60.0000 mg | Freq: Every day | SUBCUTANEOUS | Status: DC
  Administered 2016-02-12 – 2016-02-14 (×3): 60 mg via SUBCUTANEOUS
  Filled 2016-02-12 (×3): qty 0.6

## 2016-02-12 MED ORDER — ACETAMINOPHEN 325 MG PO TABS: 650.0000 mg | ORAL_TABLET | Freq: Four times a day (QID) | ORAL | Status: DC | PRN

## 2016-02-12 MED ORDER — MAGIC MOUTHWASH W/LIDOCAINE
5.0000 mL | Freq: Four times a day (QID) | ORAL | Status: DC | PRN
  Filled 2016-02-12: qty 5

## 2016-02-12 MED ORDER — ACETAMINOPHEN 650 MG RE SUPP: 650.0000 mg | Freq: Four times a day (QID) | RECTAL | Status: DC | PRN

## 2016-02-12 MED ORDER — ENOXAPARIN SODIUM 40 MG/0.4ML ~~LOC~~ SOLN: 40.0000 mg | SUBCUTANEOUS | Status: DC

## 2016-02-12 MED ORDER — PROCHLORPERAZINE MALEATE 10 MG PO TABS
10.0000 mg | ORAL_TABLET | Freq: Four times a day (QID) | ORAL | Status: DC | PRN
  Administered 2016-02-14: 10 mg via ORAL
  Filled 2016-02-12: qty 1

## 2016-02-12 MED ORDER — SODIUM CHLORIDE 0.9 % IV SOLN
2250.0000 mg | Freq: Once | INTRAVENOUS | Status: DC
  Filled 2016-02-12: qty 2250

## 2016-02-12 MED ORDER — ONDANSETRON HCL 8 MG PO TABS: 8.0000 mg | ORAL_TABLET | Freq: Two times a day (BID) | ORAL | Status: DC | PRN

## 2016-02-12 MED ORDER — SODIUM CHLORIDE 0.9 % IV SOLN
INTRAVENOUS | Status: AC
  Administered 2016-02-12: 22:00:00 via INTRAVENOUS
  Administered 2016-02-13: 1000 mL via INTRAVENOUS

## 2016-02-12 MED ORDER — ENSURE ENLIVE PO LIQD: 237.0000 mL | Freq: Two times a day (BID) | ORAL | Status: DC

## 2016-02-12 MED ORDER — IRBESARTAN 150 MG PO TABS
75.0000 mg | ORAL_TABLET | Freq: Every day | ORAL | Status: DC
  Administered 2016-02-13: 75 mg via ORAL
  Filled 2016-02-12 (×3): qty 1

## 2016-02-12 MED ORDER — DEXTROSE 5 % IV SOLN
2.0000 g | Freq: Three times a day (TID) | INTRAVENOUS | Status: DC
  Administered 2016-02-13 – 2016-02-15 (×8): 2 g via INTRAVENOUS
  Filled 2016-02-12 (×11): qty 2

## 2016-02-12 MED ORDER — DEXTROSE 5 % IV SOLN
2.0000 g | Freq: Once | INTRAVENOUS | Status: AC
  Administered 2016-02-12: 2 g via INTRAVENOUS
  Filled 2016-02-12: qty 2

## 2016-02-12 MED ORDER — HYDROCODONE-ACETAMINOPHEN 5-325 MG PO TABS: 1.0000 | ORAL_TABLET | ORAL | Status: DC | PRN

## 2016-02-12 MED ORDER — VANCOMYCIN HCL IN DEXTROSE 1-5 GM/200ML-% IV SOLN
1000.0000 mg | Freq: Two times a day (BID) | INTRAVENOUS | Status: DC
  Administered 2016-02-13: 1000 mg via INTRAVENOUS
  Filled 2016-02-12: qty 200

## 2016-02-12 NOTE — ED Notes (Signed)
RN made aware of critical lactic acid value 

## 2016-02-12 NOTE — ED Notes (Signed)
Patient given ginger ale. 

## 2016-02-12 NOTE — H&P (Addendum)
History and Physical    Nicole Schmitt [id number] DOB: Jul 21, 1960 DOA: 02/12/2016  PCP: Dr. Annamaria Boots, Oncology  Patient coming from: Home  Chief Complaint: Fever at home, recent chemotherapy  HPI: Nicole Schmitt is a 55 y.o. female with medical history significant of HTN, Breast Cancer.  She had her breast cancer surgery in September and she is 9 days out from her 3rd cycle of docetaxel and cytoxan. She noticed a fever of 101.35F today and came in to the ED.  The only significant symptom she has been having is pain, which she thinks is from the docetaxel.  She did not get neulasta after this cycle as the bone pain was attributed to this medication on the last 2 cycles.  She had a UTI after cycle 2.  Over the last 24 hours she has had constipation alternating with diarrhea and she had some rectal pain after the last episode of diarrhea.  She also noticed a "knot" in her right axilla which popped up today.   She denies cough, SOB, chest pain, productive sputum, abdominal pain, dysuria, urinary frequency.  She has a skin rash which she has gotten after every cycle of chemotherapy.  She has lost her hair due to chemotherapy.  She has mild mucositis which is improved with magic mouthwash.    ED Course: In the ED, she was found to be neutropenic with an ANC of 0 (neutrophil % 0, no bands reported on CBC, WBC 1.6).  CXR showed a possible retrocardiac infiltrate.  She was started on Abx.   Review of Systems: As per HPI otherwise 10 point review of systems negative. She reports history of vertigo and mild vertigo when rolling in bed yesterday.     Past Medical History:  Diagnosis Date  . Breast cancer ([place])   . Cancer ([place]) 1990   cervical   . Hypertension   . Seizures ([place])    > 20 years following fall from horse    Past Surgical History:  Procedure Laterality Date  . BREAST LUMPECTOMY WITH RADIOACTIVE SEED AND SENTINEL LYMPH NODE BIOPSY Left 11/16/2015   Procedure: BREAST LUMPECTOMY WITH  RADIOACTIVE SEED AND SENTINEL LYMPH NODE BIOPSY;  Surgeon: Excell Seltzer, MD;  Location: [facility];  Service: General;  Laterality: Left;  . CESAREAN SECTION    . COLONOSCOPY    . WISDOM TOOTH EXTRACTION       reports that she has never smoked. She has never used smokeless tobacco. She reports that she drinks alcohol. She reports that she does not use drugs.  No Known Allergies  Family History  Problem Relation Age of Onset  . Cancer Maternal Uncle     liver cancer/environmental exposure  . Cancer Paternal Uncle     liver cancer/environmental exposure  . Epilepsy Mother   . Intracerebral hemorrhage Mother   . COPD Father   . Breast cancer Neg Hx     Prior to Admission medications   Medication Sig Start Date End Date Taking? Authorizing Provider  CLINDAGEL 1 % gel Apply 1 application topically daily.  10/25/15  Yes Historical Provider, MD  dexamethasone (DECADRON) 4 MG tablet Take 2 tablets (8 mg total) by mouth 2 (two) times daily. Start the day before Taxotere. Then again the day after chemo for 3 days. 12/18/15  Yes [name], MD  doxycycline (VIBRAMYCIN) 100 MG capsule Take 100 mg by mouth daily as needed.  10/21/15  Yes Historical Provider, MD  hydrochlorothiazide (HYDRODIURIL) 25 MG tablet  Take 25 mg by mouth daily. 12.5 mg to 25 mg dependent on bp medication   Yes Historical Provider, MD  HYDROcodone-acetaminophen (NORCO/VICODIN) 5-325 MG tablet Take 1-2 tablets by mouth every 6 (six) hours as needed for moderate pain or severe pain. 02/03/16  Yes [name], MD  magic mouthwash w/lidocaine SOLN Take 5 mLs by mouth 4 (four) times daily as needed for mouth pain. Swish and  Swallow  Or   Swish and  Spit. 12/30/15  Yes [name], MD  ondansetron (ZOFRAN) 8 MG tablet Take 1 tablet (8 mg total) by mouth 2 (two) times daily as needed for refractory nausea / vomiting. Start on day 3 after chemo. 12/18/15  Yes [name], MD  prochlorperazine (COMPAZINE) 10 MG tablet Take 1  tablet (10 mg total) by mouth every 6 (six) hours as needed (Nausea or vomiting). 12/18/15  Yes [name], MD  valsartan (DIOVAN) 160 MG tablet Take 1 tablet (160 mg total) by mouth daily. 02/08/16  Yes [name], MD  zolpidem (AMBIEN) 5 MG tablet Take 1 tablet (5 mg total) by mouth at bedtime as needed for sleep. Patient not taking: Reported on 02/12/2016 12/30/15   [name], MD    Physical Exam: Vitals:   02/12/16 2100 02/12/16 2101 02/12/16 2130 02/12/16 2134  BP: (!) 147/111 (!) 147/111 122/74 122/74  Pulse: 97 90 88 91  Resp: 19 24 20 16   Temp:      TempSrc:      SpO2: 99% 97% 97% 96%  Weight:      Height:          Constitutional: NAD, calm, comfortable, wearing mask Vitals:   02/12/16 2100 02/12/16 2101 02/12/16 2130 02/12/16 2134  BP: (!) 147/111 (!) 147/111 122/74 122/74  Pulse: 97 90 88 91  Resp: 19 24 20 16   Temp:      TempSrc:      SpO2: 99% 97% 97% 96%  Weight:      Height:       Eyes: PERRL, lids and conjunctivae normal, EOMI ENMT: Mucous membranes are moist. She has some mild mucositis in the soft palate.   Neck: normal, supple, no masses Respiratory: clear to auscultation bilaterally, no wheezing, no crackles. Normal respiratory effort. No accessory muscle use.  Cardiovascular: Regular rate and rhythm, no murmurs / rubs / gallops.  Abdomen: no tenderness.  Bowel sounds positive.  Musculoskeletal: No clubbing / cyanosis. Normal muscle tone.  Skin: Follicular rash on chest.  No other rashes note on back or extremities.  Small axillary mass associated with hair follicle in right axilla GU: Perineal area without lesion or rash.  Small external hemorrhoid.  No anal fissures or abscess noted.  DRE deferred due to neutropenia Neurologic: Sensation intact.   Psychiatric: Normal judgment and insight. Alert and oriented x 3. Normal mood.   Labs on Admission: I have personally reviewed following labs and imaging studies  CBC:  Recent Labs Lab 02/10/16 0926  02/12/16 1825  WBC 1.1* 1.6*  NEUTROABS 0.1* 0.0*  HGB 12.7 11.2*  HCT 37.2 32.7*  MCV 85.1 85.6  PLT 312 [id number]   Basic Metabolic Panel:  Recent Labs Lab 02/10/16 0926 02/12/16 1825  NA 134* 134*  K 4.2 3.5  CL  --  101  CO2 24 24  GLUCOSE 100 121*  BUN 16.0 10  CREATININE 0.8 0.86  CALCIUM 9.6 8.3*   GFR: Estimated Creatinine Clearance: 99.6 mL/min (by C-G formula based on SCr of 0.86 mg/dL).  Liver Function Tests:  Recent Labs Lab 02/10/16 0926 02/12/16 1825  AST 20 22  ALT 25 23  ALKPHOS 105 81  BILITOT 0.87 0.8  PROT 6.6 6.3*  ALBUMIN 3.2* 3.2*  Coagulation Profile:  Recent Labs Lab 02/12/16 1825  INR 1.04  Urine analysis:    Component Value Date/Time   COLORURINE YELLOW 02/12/2016 1900   APPEARANCEUR HAZY (A) 02/12/2016 1900   LABSPEC 1.021 02/12/2016 1900   LABSPEC 1.030 01/13/2016 0846   PHURINE 5.0 02/12/2016 1900   GLUCOSEU NEGATIVE 02/12/2016 1900   GLUCOSEU 2,000 01/13/2016 0846   HGBUR NEGATIVE 02/12/2016 1900   BILIRUBINUR NEGATIVE 02/12/2016 1900   BILIRUBINUR Negative 01/13/2016 0846   KETONESUR NEGATIVE 02/12/2016 1900   PROTEINUR NEGATIVE 02/12/2016 1900   UROBILINOGEN 0.2 01/13/2016 0846   NITRITE NEGATIVE 02/12/2016 1900   LEUKOCYTESUR NEGATIVE 02/12/2016 1900   LEUKOCYTESUR Negative 01/13/2016 0846    Radiological Exams on Admission: Dg Chest 2 View  Result Date: 02/12/2016 CLINICAL DATA:  Initial evaluation for acute fever. Shortness of breath. EXAM: CHEST  2 VIEW COMPARISON:  None available. FINDINGS: Cardiac and mediastinal silhouettes are within normal limits. Lungs are normally inflated. There is patchy opacity within the retrocardiac left lower lobe, suspicious for possible small infiltrate given the provided history. Superimposed mild peribronchial thickening present. No other focal airspace disease. No pulmonary edema or pleural effusion. No pneumothorax. No acute osseous abnormality. IMPRESSION: 1. Mild patchy retrocardiac  left lower lobe opacity, suspicious for small focal infiltrate given the provided history. 2. Superimposed mild diffuse peribronchial thickening. Electronically Signed   By: Jeannine Boga M.D.   On: 02/12/2016 18:49      Assessment/Plan Febrile neutropenia - She is 9 days s/p 3rd cycle of chemotherapy for breast cancer.  - Fever documented at home at 101.3 - Started on cefepime/vancomycin - CXR with possible infiltrate, other possibility is GI tract given rectal pain - Monitor for changes in small abscess in axilla - CBC daily - Oncology consult in the AM  - Neutropenic precautions - UA was clear - BC X 2 and UC pending - Pain control with vicoden - Nausea control with phenergan/zofran - Lactic acid 2.1 - trend - IVF with NS at 100cc/hr overnight, reassess in AM - Hemodynamically stable while in the ED and no further fevers noted.  She has received first dose of Abx (cefepime/vancomycin).  I think she can be managed on floor at this time.  If any changes, transfer to SDU   Breast cancer of upper-outer quadrant of left female breast - She is s/p cycle 3 of docetaxel and cytoxan, she did not receive GCSF during this cycle - Contact Oncology in the AM    HTN (hypertension) - BP well controlled - In house formulary ARB was started with hold parameters   DVT prophylaxis: Lovenox  Code Status: Full Family Communication: Husband at bedside  Disposition Plan: Pending resolution of neutropenia   Admission status: Inpatient, Buren Kos MD [facility] Pager [phone]  If 7PM-7AM, please contact night-coverage [email or web address] Password [facility]  02/12/2016, 9:47 PM

## 2016-02-12 NOTE — Progress Notes (Signed)
Pharmacy Antibiotic Note  Nicole Schmitt is a 55 y.o. female admitted on 02/12/2016 with febrile neutropenia.  Pharmacy has been consulted for Vanc/Cefepime dosing.  Plan: Vancomycin 2500mg  IV x 1 then 1g IV every 12 hours.  Goal trough 15-20 mcg/mL. Cefepime 2g IV q8  Height: 5\' 5"  (165.1 cm) Weight: 271 lb (122.9 kg) IBW/kg (Calculated) : 57  Temp (24hrs), Avg:99.4 F (37.4 C), Min:98.9 F (37.2 C), Max:99.8 F (37.7 C)   Recent Labs Lab 02/10/16 0926 02/10/16 0926 02/12/16 1825 02/12/16 1834  WBC 1.1*  --   --   --   CREATININE  --  0.8 0.86  --   LATICACIDVEN  --   --   --  2.10*    Estimated Creatinine Clearance: 99.6 mL/min (by C-G formula based on SCr of 0.86 mg/dL).    No Known Allergies    Thank you for allowing pharmacy to be a part of this patient's care.    Adrian Saran, PharmD, BCPS Pager [phone] 02/12/2016 6:54 PM

## 2016-02-12 NOTE — ED Provider Notes (Signed)
[facility] DEPT Provider Note   CSN: [id number] Arrival date & time: 02/12/16  1731     History   Chief Complaint Chief Complaint  Patient presents with  . Fever    CA pt    HPI Nicole Schmitt is a 55 y.o. female.  Patient with history of breast cancer, undergoing chemotherapy, last cycle without Neulasta -- presents with fever and chills starting yesterday. Patient documented a temperature of 101.28F at home this afternoon. They called oncology office and was told to come to the emergency department. They live one hour away. Patient states that she has been nauseous and weak and has not bounced back from her most recent chemotherapy session which was 9 days ago. She had her blood checked 2 days ago and was found to have a neutrophil count of 0.1. She denies URI symptoms, cough, chest pain, shortness of breath, vomiting, diarrhea. Patient has noted a small "knot" underneath her right armpit and has had some rectal pain with bowel movements. No blood in the stool. Patient has a rash which is typical after chemotherapy treatment for her. The onset of this condition was acute. The course is constant. Aggravating factors: none. Alleviating factors: none.        Past Medical History:  Diagnosis Date  . Breast cancer ([place])   . Cancer ([place]) 1990   cervical   . Hypertension   . Seizures ([place])    > 20 years following fall from horse    Patient Active Problem List   Diagnosis Date Noted  . HTN (hypertension) 01/13/2016  . Breast cancer of upper-outer quadrant of left female breast ([place]) 11/23/2015    Past Surgical History:  Procedure Laterality Date  . BREAST LUMPECTOMY WITH RADIOACTIVE SEED AND SENTINEL LYMPH NODE BIOPSY Left 11/16/2015   Procedure: BREAST LUMPECTOMY WITH RADIOACTIVE SEED AND SENTINEL LYMPH NODE BIOPSY;  Surgeon: Excell Seltzer, MD;  Location: [facility];  Service: General;  Laterality: Left;  . CESAREAN SECTION    . COLOSTOMY    .  WISDOM TOOTH EXTRACTION      OB History    No data available       Home Medications    Prior to Admission medications   Medication Sig Start Date End Date Taking? Authorizing Provider  CLINDAGEL 1 % gel Apply 1 application topically daily.  10/25/15  Yes Historical Provider, MD  dexamethasone (DECADRON) 4 MG tablet Take 2 tablets (8 mg total) by mouth 2 (two) times daily. Start the day before Taxotere. Then again the day after chemo for 3 days. 12/18/15  Yes [name], MD  doxycycline (VIBRAMYCIN) 100 MG capsule Take 100 mg by mouth daily as needed.  10/21/15  Yes Historical Provider, MD  hydrochlorothiazide (HYDRODIURIL) 25 MG tablet Take 25 mg by mouth daily. 12.5 mg to 25 mg dependent on bp medication   Yes Historical Provider, MD  HYDROcodone-acetaminophen (NORCO/VICODIN) 5-325 MG tablet Take 1-2 tablets by mouth every 6 (six) hours as needed for moderate pain or severe pain. 02/03/16  Yes [name], MD  magic mouthwash w/lidocaine SOLN Take 5 mLs by mouth 4 (four) times daily as needed for mouth pain. Swish and  Swallow  Or   Swish and  Spit. 12/30/15  Yes [name], MD  ondansetron (ZOFRAN) 8 MG tablet Take 1 tablet (8 mg total) by mouth 2 (two) times daily as needed for refractory nausea / vomiting. Start on day 3 after chemo. 12/18/15  Yes [name], MD  prochlorperazine (COMPAZINE) 10 MG tablet Take 1 tablet (10 mg total) by mouth every 6 (six) hours as needed (Nausea or vomiting). 12/18/15  Yes [name], MD  valsartan (DIOVAN) 160 MG tablet Take 1 tablet (160 mg total) by mouth daily. 02/08/16  Yes [name], MD  zolpidem (AMBIEN) 5 MG tablet Take 1 tablet (5 mg total) by mouth at bedtime as needed for sleep. Patient not taking: Reported on 02/12/2016 12/30/15   [name], MD    Family History Family History  Problem Relation Age of Onset  . Cancer Maternal Uncle     liver cancer/environmental exposure  . Cancer Paternal Uncle     liver cancer/environmental exposure    Social  History Social History  Substance Use Topics  . Smoking status: Never Smoker  . Smokeless tobacco: Never Used  . Alcohol use Yes     Comment: rare     Allergies   Patient has no known allergies.   Review of Systems Review of Systems  Constitutional: Positive for chills and fever.  HENT: Negative for rhinorrhea and sore throat.   Eyes: Negative for redness.  Respiratory: Negative for cough and shortness of breath.   Cardiovascular: Negative for chest pain.  Gastrointestinal: Positive for rectal pain. Negative for abdominal pain, diarrhea, nausea and vomiting.  Genitourinary: Negative for dysuria.  Musculoskeletal: Negative for myalgias.  Skin: Positive for color change and rash.  Neurological: Negative for headaches.     Physical Exam Updated Vital Signs BP 105/60   Pulse 97   Temp 99.8 F (37.7 C) (Oral)   Resp 24   Ht 5\' 5"  (1.651 m)   Wt 122.9 kg   SpO2 97%   BMI 45.10 kg/m   Physical Exam  Constitutional: She appears well-developed and well-nourished.  HENT:  Head: Normocephalic and atraumatic.  Nose: Nose normal.  Several small ulcerations on roof of mouth.  Eyes: Conjunctivae are normal. Right eye exhibits no discharge. Left eye exhibits no discharge.  Neck: Normal range of motion. Neck supple.  Cardiovascular: Normal rate, regular rhythm and normal heart sounds.   Pulmonary/Chest: Effort normal and breath sounds normal. No respiratory distress. She has no wheezes. She has no rales.  Abdominal: Soft. There is no tenderness.  Genitourinary: Rectal exam shows external hemorrhoid.  Genitourinary Comments: Nonthrombosed external hemorrhoid, no perianal or perirectal abscess or cellulitis noted.  Neurological: She is alert.  Skin: Skin is warm and dry.  Patient with macular rash noted on upper chest and face. She has a small <1 cm nodule, consistent with early abscess, right axilla.  Psychiatric: She has a normal mood and affect.  Nursing note and vitals  reviewed.    ED Treatments / Results  Labs (all labs ordered are listed, but only abnormal results are displayed) Labs Reviewed  COMPREHENSIVE METABOLIC PANEL - Abnormal; Notable for the following:       Result Value   Sodium 134 (*)    Glucose, Bld 121 (*)    Calcium 8.3 (*)    Total Protein 6.3 (*)    Albumin 3.2 (*)    All other components within normal limits  CBC WITH DIFFERENTIAL/PLATELET - Abnormal; Notable for the following:    WBC 1.6 (*)    RBC 3.82 (*)    Hemoglobin 11.2 (*)    HCT 32.7 (*)    Neutro Abs 0.0 (*)    All other components within normal limits  URINALYSIS, ROUTINE W REFLEX MICROSCOPIC - Abnormal; Notable for the following:  APPearance HAZY (*)    All other components within normal limits  I-STAT CG4 LACTIC ACID, ED - Abnormal; Notable for the following:    Lactic Acid, Venous 2.10 (*)    All other components within normal limits  CULTURE, BLOOD (ROUTINE X 2)  CULTURE, BLOOD (ROUTINE X 2)  URINE CULTURE  PROTIME-INR  I-STAT BETA HCG BLOOD, ED ([facility], [facility], [facility] ONLY)  I-STAT CG4 LACTIC ACID, ED    Radiology Dg Chest 2 View  Result Date: 02/12/2016 CLINICAL DATA:  Initial evaluation for acute fever. Shortness of breath. EXAM: CHEST  2 VIEW COMPARISON:  None available. FINDINGS: Cardiac and mediastinal silhouettes are within normal limits. Lungs are normally inflated. There is patchy opacity within the retrocardiac left lower lobe, suspicious for possible small infiltrate given the provided history. Superimposed mild peribronchial thickening present. No other focal airspace disease. No pulmonary edema or pleural effusion. No pneumothorax. No acute osseous abnormality. IMPRESSION: 1. Mild patchy retrocardiac left lower lobe opacity, suspicious for small focal infiltrate given the provided history. 2. Superimposed mild diffuse peribronchial thickening. Electronically Signed   By: Jeannine Boga M.D.   On: 02/12/2016 18:49    Procedures Procedures  (including critical care time)  Medications Ordered in ED Medications  vancomycin (VANCOCIN) 2,500 mg in sodium chloride 0.9 % 500 mL IVPB (2,500 mg Intravenous New Bag/Given 02/12/16 1935)  vancomycin (VANCOCIN) IVPB 1000 mg/200 mL premix (not administered)  ceFEPIme (MAXIPIME) 2 g in dextrose 5 % 50 mL IVPB (not administered)  ceFEPIme (MAXIPIME) 2 g in dextrose 5 % 50 mL IVPB (0 g Intravenous Stopped 02/12/16 1934)     Initial Impression / Assessment and Plan / ED Course  I have reviewed the triage vital signs and the nursing notes.  Pertinent labs & imaging results that were available during my care of the patient were reviewed by me and considered in my medical decision making (see chart for details).  Clinical Course    Patient seen and examined. Work-up initiated. Medications ordered.   Vital signs reviewed and are as follows: BP 105/60   Pulse 97   Temp 99.8 F (37.7 C) (Oral)   Resp 24   Ht 5\' 5"  (1.651 m)   Wt 122.9 kg   SpO2 97%   BMI 45.10 kg/m   8:03 PM Discussed with Dr. Lacinda Axon who has seen. Will admit, CAP, neutropenic fever.   8:15 PM Spoke with Dr. Daryll Drown who will see.   Final Clinical Impressions(s) / ED Diagnoses   Final diagnoses:  Neutropenic fever ([place])  Community acquired pneumonia of left lower lobe of lung ([place])  Cutaneous abscess of right axilla   Admit.   New Prescriptions New Prescriptions   No medications on file     Carlisle Cater, PA-C 02/12/16 2015    Nat Christen, MD 02/18/16 [phone]

## 2016-02-12 NOTE — Progress Notes (Signed)
Rx Brief note:  Lovenox  Wt=122 kg, BMI=45, CrCl= 99 ml/min  Rx adjusted Lovenox to 60 mg daily (~0.5 mg/kg) in pt with BMI>30  Thanks Dorrene German 02/12/2016 10:23 PM

## 2016-02-12 NOTE — ED Notes (Signed)
Pt taken out of room for diagnostic/xray, wll access vitals upon return. [employer]

## 2016-02-12 NOTE — ED Triage Notes (Signed)
Pt sent from cancer pt , fever 100.3 taken at home . Pt sst is neutropenic. Last chemo 12/7, presents with rash . WBC 0.1 last checked 12/14. sts diarrhea . Also reports found new lump under right arm. Did not take any meds prior to arrival. Alert and oriented x 4.

## 2016-02-12 NOTE — ED Notes (Signed)
Bed: WA16 Expected date:  Expected time:  Means of arrival:  Comments: Hold for Resus A 

## 2016-02-13 DIAGNOSIS — D709 Neutropenia, unspecified: Secondary | ICD-10-CM

## 2016-02-13 DIAGNOSIS — I10 Essential (primary) hypertension: Secondary | ICD-10-CM

## 2016-02-13 DIAGNOSIS — R5081 Fever presenting with conditions classified elsewhere: Secondary | ICD-10-CM

## 2016-02-13 DIAGNOSIS — E876 Hypokalemia: Secondary | ICD-10-CM

## 2016-02-13 LAB — CBC
HCT: 29.8 % — ABNORMAL LOW (ref 36.0–46.0)
HEMOGLOBIN: 10.3 g/dL — AB (ref 12.0–15.0)
MCH: 29.1 pg (ref 26.0–34.0)
MCHC: 34.6 g/dL (ref 30.0–36.0)
MCV: 84.2 fL (ref 78.0–100.0)
PLATELETS: 257 10*3/uL (ref 150–400)
RBC: 3.54 MIL/uL — AB (ref 3.87–5.11)
RDW: 14.4 % (ref 11.5–15.5)
WBC: 1.9 10*3/uL — ABNORMAL LOW (ref 4.0–10.5)

## 2016-02-13 LAB — CBC WITH DIFFERENTIAL/PLATELET
Basophils Absolute: 0 10*3/uL (ref 0.0–0.1)
Basophils Relative: 1 %
Eosinophils Absolute: 0 10*3/uL (ref 0.0–0.7)
Eosinophils Relative: 0 %
HEMATOCRIT: 29.2 % — AB (ref 36.0–46.0)
HEMOGLOBIN: 10 g/dL — AB (ref 12.0–15.0)
LYMPHS ABS: 0.7 10*3/uL (ref 0.7–4.0)
LYMPHS PCT: 49 %
MCH: 28.9 pg (ref 26.0–34.0)
MCHC: 34.2 g/dL (ref 30.0–36.0)
MCV: 84.4 fL (ref 78.0–100.0)
MONOS PCT: 50 %
Monocytes Absolute: 0.8 10*3/uL (ref 0.1–1.0)
Neutro Abs: 0 10*3/uL — ABNORMAL LOW (ref 1.7–7.7)
Neutrophils Relative %: 0 %
Platelets: 229 10*3/uL (ref 150–400)
RBC: 3.46 MIL/uL — AB (ref 3.87–5.11)
RDW: 14.5 % (ref 11.5–15.5)
WBC: 1.5 10*3/uL — ABNORMAL LOW (ref 4.0–10.5)

## 2016-02-13 LAB — COMPREHENSIVE METABOLIC PANEL
ALK PHOS: 68 U/L (ref 38–126)
ALT: 20 U/L (ref 14–54)
ANION GAP: 7 (ref 5–15)
AST: 20 U/L (ref 15–41)
Albumin: 3 g/dL — ABNORMAL LOW (ref 3.5–5.0)
BILIRUBIN TOTAL: 0.9 mg/dL (ref 0.3–1.2)
BUN: 8 mg/dL (ref 6–20)
CALCIUM: 8.1 mg/dL — AB (ref 8.9–10.3)
CO2: 22 mmol/L (ref 22–32)
CREATININE: 0.73 mg/dL (ref 0.44–1.00)
Chloride: 104 mmol/L (ref 101–111)
GFR calc non Af Amer: >60 mL/min (ref >60)
GLUCOSE: 129 mg/dL — AB (ref 65–99)
Potassium: 3.3 mmol/L — ABNORMAL LOW (ref 3.5–5.1)
SODIUM: 133 mmol/L — AB (ref 135–145)
Total Protein: 5.7 g/dL — ABNORMAL LOW (ref 6.5–8.1)

## 2016-02-13 LAB — LACTIC ACID, PLASMA
LACTIC ACID, VENOUS: 2.1 mmol/L — AB (ref 0.5–1.9)
LACTIC ACID, VENOUS: 0.7 mmol/L (ref 0.5–1.9)

## 2016-02-13 MED ORDER — VANCOMYCIN HCL 10 G IV SOLR
1250.0000 mg | Freq: Two times a day (BID) | INTRAVENOUS | Status: DC
  Administered 2016-02-13 – 2016-02-15 (×4): 1250 mg via INTRAVENOUS
  Filled 2016-02-13 (×6): qty 1250

## 2016-02-13 MED ORDER — POTASSIUM CHLORIDE CRYS ER 20 MEQ PO TBCR
40.0000 meq | EXTENDED_RELEASE_TABLET | Freq: Once | ORAL | Status: AC
  Administered 2016-02-13: 40 meq via ORAL
  Filled 2016-02-13: qty 2

## 2016-02-13 MED ORDER — SODIUM CHLORIDE 0.9 % IV BOLUS (SEPSIS)
250.0000 mL | Freq: Once | INTRAVENOUS | Status: AC
  Administered 2016-02-13: 250 mL via INTRAVENOUS

## 2016-02-13 NOTE — Progress Notes (Signed)
CRITICAL VALUE ALERT  Critical value received:  Lactic acid 2.1  Date of notification:  02/13/16  Time of notification:  0215  Critical value read back:Yes.    Nurse who received alert:  Oletha Cruel RN  MD notified (1st page):  Hal Hope MD  Time of first page:  0218  MD notified (2nd page):  Time of second page:  Responding MD:  Hal Hope MD  Time MD responded:

## 2016-02-13 NOTE — Progress Notes (Signed)
Pharmacy Antibiotic Note  Nicole Schmitt is a 55 y.o. female admitted on 02/12/2016 with febrile neutropenia.  Pharmacy has been consulted for Vanc/Cefepime dosing.  Plan:  Based on weight, change to vancomycin 1250mg  IV q12h  Continue cefepime 2gm IV q8h  Height: 5\' 5"  (165.1 cm) Weight: 270 lb 8.1 oz (122.7 kg) IBW/kg (Calculated) : 57  Temp (24hrs), Avg:99.2 F (37.3 C), Min:98.9 F (37.2 C), Max:99.8 F (37.7 C)   Recent Labs Lab 02/10/16 0926 02/10/16 0926 02/12/16 1825 02/12/16 1834 02/12/16 2232 02/13/16 0122 02/13/16 0721 02/13/16 0938  WBC 1.1*  --  1.6*  --   --  1.9*  --  1.5*  CREATININE  --  0.8 0.86  --   --  0.73  --   --   LATICACIDVEN  --   --   --  2.10* 1.1 2.1* 0.7  --     Estimated Creatinine Clearance: 106.9 mL/min (by C-G formula based on SCr of 0.73 mg/dL).    No Known Allergies    Thank you for allowing pharmacy to be a part of this patient's care.  Doreene Eland, PharmD, BCPS.   Pager: [phone] 02/13/2016 11:39 AM

## 2016-02-13 NOTE — Progress Notes (Signed)
PROGRESS NOTE                                                                                                                                                                                                             Patient Demographics:    Nicole Schmitt, is a 55 y.o. female, DOB - [birth year], [id number]  Admit date - 02/12/2016   Admitting Physician Sid Falcon, MD  Outpatient Primary MD for the patient is Pcp Not In System  LOS - 1  Outpatient Specialists: Dr Burr Medico  Chief Complaint  Patient presents with  . 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 She completed her recent cycle about 10 days back. She did not received Neulasta after the cycle due to severe bone pain. Patient came to the hospital with fever of 101.84F, denied cough with shortness of breath and chest discomfort. She also noticed small bump in her right axilla.  In the ED she was found to have a low-grade fever and tachycardic. She was found to be neutropenia and mildly elevated lactic acid. with chest x-ray showing left retrocardiac opacity suggestive of pneumonia.   Subjective:   Patient reports feeling better today.   Assessment  & Plan :    Active Problems:     Febrile neutropenia (HCC) Possibly secondary to left lobar pneumonia. ANC of 0. Treating as HCAP. Empiric vancomycin and cefepime. Follow blood cultures. Supportive care with Tylenol. Monitor CBC closely. Neutropenic precautions.    Breast cancer of upper-outer quadrant of left female breast [facility]) Recent chemotherapy. (Cycle 3 of docetaxel and Cytoxan) . Did not receive Neulasta this cycle due to bone pain. I have added her oncologist Dr. Burr Medico as consult.  Essential hypertension Stable. Continue home  medication.   Hypokalemia  replenished    Code Status : Full code  Family Communication  :  None at bedside  Disposition Plan  : Home once symptoms improved, possibly in the next 48 hours  Barriers For Discharge : Active symptoms  Consults  :  Oncology  Procedures  : None  DVT Prophylaxis  :  Lovenox -  Lab Results  Component Value Date   PLT 229  02/13/2016    Antibiotics  :    Anti-infectives    Start     Dose/Rate Route Frequency Ordered Stop   02/13/16 2000  vancomycin (VANCOCIN) 1,250 mg in sodium chloride 0.9 % 250 mL IVPB     1,250 mg 166.7 mL/hr over 90 Minutes Intravenous Every 12 hours 02/13/16 1137     02/13/16 0800  vancomycin (VANCOCIN) IVPB 1000 mg/200 mL premix  Status:  Discontinued     1,000 mg 200 mL/hr over 60 Minutes Intravenous Every 12 hours 02/12/16 1856 02/13/16 1137   02/13/16 0200  ceFEPIme (MAXIPIME) 2 g in dextrose 5 % 50 mL IVPB     2 g 100 mL/hr over 30 Minutes Intravenous Every 8 hours 02/12/16 1856     02/12/16 1830  vancomycin (VANCOCIN) 2,250 mg in sodium chloride 0.9 % 500 mL IVPB  Status:  Discontinued     2,250 mg 250 mL/hr over 120 Minutes Intravenous  Once 02/12/16 1815 02/12/16 1816   02/12/16 1830  vancomycin (VANCOCIN) 2,500 mg in sodium chloride 0.9 % 500 mL IVPB     2,500 mg 250 mL/hr over 120 Minutes Intravenous  Once 02/12/16 1816 02/12/16 2148   02/12/16 1800  ceFEPIme (MAXIPIME) 2 g in dextrose 5 % 50 mL IVPB     2 g 100 mL/hr over 30 Minutes Intravenous  Once 02/12/16 1751 02/12/16 1934        Objective:   Vitals:   02/12/16 2130 02/12/16 2134 02/12/16 2216 02/13/16 0459  BP: 122/74 122/74 120/77 103/64  Pulse: 88 91 89 84  Resp: 20 16 20 18   Temp:   99.2 F (37.3 C) 98.9 F (37.2 C)  TempSrc:   Oral Oral  SpO2: 97% 96% 97% 99%  Weight:    122.7 kg (270 lb 8.1 oz)  Height:    5\' 5"  (1.651 m)    Wt Readings from Last 3 Encounters:  02/13/16 122.7 kg (270 lb 8.1 oz)  02/03/16 123.1 kg (271 lb 4.8 oz)   01/13/16 124.1 kg (273 lb 11.2 oz)     Intake/Output Summary (Last 24 hours) at 02/13/16 1256 Last data filed at 02/13/16 1009  Gross per 24 hour  Intake              710 ml  Output                0 ml  Net              710 ml     Physical Exam  Gen: not in distress HEENT: moist mucosa, supple neck Chest: clear b/l, no added sounds CVS: N S1&S2, no murmurs, rubs or gallop GI: soft, NT, ND, BS+ Musculoskeletal: warm, no edema, tiny nodule over rt axilla, no erythema or discharge. Minimally tender     Data Review:    CBC  Recent Labs Lab 02/10/16 0926 02/12/16 1825 02/13/16 0122 02/13/16 0938  WBC 1.1* 1.6* 1.9* 1.5*  HGB 12.7 11.2* 10.3* 10.0*  HCT 37.2 32.7* 29.8* 29.2*  PLT 312 283 257 229  MCV 85.1 85.6 84.2 84.4  MCH 29.1 29.3 29.1 28.9  MCHC 34.1 34.3 34.6 34.2  RDW 14.6* 14.7 14.4 14.5  LYMPHSABS 0.9 0.9  --  0.7  MONOABS 0.1 0.7  --  0.8  EOSABS 0.0 0.0  --  0.0  BASOSABS 0.0 0.0  --  0.0    Chemistries   Recent Labs Lab 02/10/16 0926 02/12/16 1825 02/13/16 0122  NA  134* 134* 133*  K 4.2 3.5 3.3*  CL  --  101 104  CO2 24 24 22   GLUCOSE 100 121* 129*  BUN 16.0 10 8  CREATININE 0.8 0.86 0.73  CALCIUM 9.6 8.3* 8.1*  AST 20 22 20   ALT 25 23 20   ALKPHOS 105 81 68  BILITOT 0.87 0.8 0.9   ------------------------------------------------------------------------------------------------------------------ No results for input(s): CHOL, HDL, LDLCALC, TRIG, CHOLHDL, LDLDIRECT in the last 72 hours.  No results found for: HGBA1C ------------------------------------------------------------------------------------------------------------------ No results for input(s): TSH, T4TOTAL, T3FREE, THYROIDAB in the last 72 hours.  Invalid input(s): FREET3 ------------------------------------------------------------------------------------------------------------------ No results for input(s): VITAMINB12, FOLATE, FERRITIN, TIBC, IRON, RETICCTPCT in the last  72 hours.  Coagulation profile  Recent Labs Lab 02/12/16 1825  INR 1.04    No results for input(s): DDIMER in the last 72 hours.  Cardiac Enzymes No results for input(s): CKMB, TROPONINI, MYOGLOBIN in the last 168 hours.  Invalid input(s): CK ------------------------------------------------------------------------------------------------------------------ No results found for: BNP  Inpatient Medications  Scheduled Meds: . ceFEPime (MAXIPIME) IV  2 g Intravenous Q8H  . enoxaparin (LOVENOX) injection  60 mg Subcutaneous QHS  . feeding supplement (ENSURE ENLIVE)  237 mL Oral BID BM  . irbesartan  75 mg Oral Daily  . vancomycin  1,250 mg Intravenous Q12H   Continuous Infusions: PRN Meds:.acetaminophen **OR** acetaminophen, HYDROcodone-acetaminophen, magic mouthwash w/lidocaine, ondansetron, prochlorperazine  Micro Results No results found for this or any previous visit (from the past 240 hour(s)).  Radiology Reports Dg Chest 2 View  Result Date: 02/12/2016 CLINICAL DATA:  Initial evaluation for acute fever. Shortness of breath. EXAM: CHEST  2 VIEW COMPARISON:  None available. FINDINGS: Cardiac and mediastinal silhouettes are within normal limits. Lungs are normally inflated. There is patchy opacity within the retrocardiac left lower lobe, suspicious for possible small infiltrate given the provided history. Superimposed mild peribronchial thickening present. No other focal airspace disease. No pulmonary edema or pleural effusion. No pneumothorax. No acute osseous abnormality. IMPRESSION: 1. Mild patchy retrocardiac left lower lobe opacity, suspicious for small focal infiltrate given the provided history. 2. Superimposed mild diffuse peribronchial thickening. Electronically Signed   By: Jeannine Boga M.D.   On: 02/12/2016 18:49    Time Spent in minutes  25   Louellen Molder M.D on 02/13/2016 at 12:56 PM  Between 7am to 7pm - Pager - [phone]  After 7pm go to  [email or web address] - password [facility]  [facility] -  Office  [phone]

## 2016-02-13 NOTE — Progress Notes (Signed)
PHARMACY - PHYSICIAN COMMUNICATION CRITICAL VALUE ALERT - BLOOD CULTURE IDENTIFICATION (BCID)  Results for orders placed or performed during the hospital encounter of 02/12/16  Blood Culture ID Panel (Reflexed) (Collected: 02/12/2016  6:19 PM)  Result Value Ref Range   Enterococcus species NOT DETECTED NOT DETECTED   Listeria monocytogenes NOT DETECTED NOT DETECTED   Staphylococcus species DETECTED (A) NOT DETECTED   Staphylococcus aureus NOT DETECTED NOT DETECTED   Methicillin resistance DETECTED (A) NOT DETECTED   Streptococcus species NOT DETECTED NOT DETECTED   Streptococcus agalactiae NOT DETECTED NOT DETECTED   Streptococcus pneumoniae NOT DETECTED NOT DETECTED   Streptococcus pyogenes NOT DETECTED NOT DETECTED   Acinetobacter baumannii NOT DETECTED NOT DETECTED   Enterobacteriaceae species NOT DETECTED NOT DETECTED   Enterobacter cloacae complex NOT DETECTED NOT DETECTED   Escherichia coli NOT DETECTED NOT DETECTED   Klebsiella oxytoca NOT DETECTED NOT DETECTED   Klebsiella pneumoniae NOT DETECTED NOT DETECTED   Proteus species NOT DETECTED NOT DETECTED   Serratia marcescens NOT DETECTED NOT DETECTED   Haemophilus influenzae NOT DETECTED NOT DETECTED   Neisseria meningitidis NOT DETECTED NOT DETECTED   Pseudomonas aeruginosa NOT DETECTED NOT DETECTED   Candida albicans NOT DETECTED NOT DETECTED   Candida glabrata NOT DETECTED NOT DETECTED   Candida krusei NOT DETECTED NOT DETECTED   Candida parapsilosis NOT DETECTED NOT DETECTED   Candida tropicalis NOT DETECTED NOT DETECTED    Name of physician (or Provider) Contacted: Raliegh Ip Schorr NP  Changes to prescribed antibiotics required: None  Romeo Rabon, PharmD, pager [phone]. 02/13/2016,8:55 PM.

## 2016-02-14 DIAGNOSIS — A4902 Methicillin resistant Staphylococcus aureus infection, unspecified site: Secondary | ICD-10-CM

## 2016-02-14 DIAGNOSIS — D701 Agranulocytosis secondary to cancer chemotherapy: Secondary | ICD-10-CM

## 2016-02-14 DIAGNOSIS — C50912 Malignant neoplasm of unspecified site of left female breast: Secondary | ICD-10-CM

## 2016-02-14 DIAGNOSIS — J189 Pneumonia, unspecified organism: Secondary | ICD-10-CM

## 2016-02-14 DIAGNOSIS — R7881 Bacteremia: Secondary | ICD-10-CM

## 2016-02-14 LAB — CBC WITH DIFFERENTIAL/PLATELET
Basophils Absolute: 0 10*3/uL (ref 0.0–0.1)
Basophils Relative: 1 %
EOS ABS: 0 10*3/uL (ref 0.0–0.7)
EOS PCT: 0 %
HEMATOCRIT: 29.3 % — AB (ref 36.0–46.0)
HEMOGLOBIN: 9.9 g/dL — AB (ref 12.0–15.0)
LYMPHS PCT: 40 %
Lymphs Abs: 1.2 10*3/uL (ref 0.7–4.0)
MCH: 29 pg (ref 26.0–34.0)
MCHC: 33.8 g/dL (ref 30.0–36.0)
MCV: 85.9 fL (ref 78.0–100.0)
MONOS PCT: 53 %
Monocytes Absolute: 1.7 10*3/uL — ABNORMAL HIGH (ref 0.1–1.0)
NEUTROS ABS: 0.2 10*3/uL — AB (ref 1.7–7.7)
Neutrophils Relative %: 6 %
Platelets: 246 10*3/uL (ref 150–400)
RBC: 3.41 MIL/uL — ABNORMAL LOW (ref 3.87–5.11)
RDW: 14.6 % (ref 11.5–15.5)
WBC: 3.1 10*3/uL — ABNORMAL LOW (ref 4.0–10.5)

## 2016-02-14 LAB — URINE CULTURE

## 2016-02-14 LAB — BLOOD CULTURE ID PANEL (REFLEXED)
Acinetobacter baumannii: NOT DETECTED
CANDIDA ALBICANS: NOT DETECTED
CANDIDA GLABRATA: NOT DETECTED
CANDIDA PARAPSILOSIS: NOT DETECTED
CANDIDA TROPICALIS: NOT DETECTED
Candida krusei: NOT DETECTED
ENTEROBACTER CLOACAE COMPLEX: NOT DETECTED
ESCHERICHIA COLI: NOT DETECTED
Enterobacteriaceae species: NOT DETECTED
Enterococcus species: NOT DETECTED
Haemophilus influenzae: NOT DETECTED
KLEBSIELLA OXYTOCA: NOT DETECTED
KLEBSIELLA PNEUMONIAE: NOT DETECTED
Listeria monocytogenes: NOT DETECTED
Methicillin resistance: DETECTED — AB
Neisseria meningitidis: NOT DETECTED
PSEUDOMONAS AERUGINOSA: NOT DETECTED
Proteus species: NOT DETECTED
STREPTOCOCCUS PNEUMONIAE: NOT DETECTED
STREPTOCOCCUS PYOGENES: NOT DETECTED
STREPTOCOCCUS SPECIES: NOT DETECTED
Serratia marcescens: NOT DETECTED
Staphylococcus aureus (BCID): NOT DETECTED
Staphylococcus species: DETECTED — AB
Streptococcus agalactiae: NOT DETECTED

## 2016-02-14 MED ORDER — TBO-FILGRASTIM 480 MCG/0.8ML ~~LOC~~ SOSY
480.0000 ug | PREFILLED_SYRINGE | Freq: Every day | SUBCUTANEOUS | Status: DC
  Administered 2016-02-14 – 2016-02-15 (×2): 480 ug via SUBCUTANEOUS
  Filled 2016-02-14 (×2): qty 0.8

## 2016-02-14 NOTE — Progress Notes (Signed)
PROGRESS NOTE                                                                                                                                                                                                             Patient Demographics:    Nicole Schmitt, is a 55 y.o. female, DOB - [birth year], [id number]  Admit date - 02/12/2016   Admitting Physician Sid Falcon, MD  Outpatient Primary MD for the patient is Pcp Not In System  LOS - 2  Outpatient Specialists: Dr Burr Medico  Chief Complaint  Patient presents with  . 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 She completed her recent cycle about 10 days back. She did not received Neulasta after the cycle due to severe bone pain. Patient came to the hospital with fever of 101.23F, denied cough with shortness of breath and chest discomfort. She also noticed small bump in her right axilla.  In the ED she was found to have a low-grade fever and tachycardic. She was found to be neutropenia and mildly elevated lactic acid. with chest x-ray showing left retrocardiac opacity suggestive of pneumonia.   Subjective:   Afebrile. 1/2 blood culture growing MRSA.   Assessment  & Plan :    Active Problems:     Febrile neutropenia (HCC) Possibly secondary to left lobar pneumonia. ANC Improved but still less than 200.. Treating as HCAP. Empiric vancomycin and cefepime. 1/2 blood culture growing MRSA. Follow sensitivity. Will need repeat blood culture for creating an may need 2-D echo to rule out vegetation.  Continue neutropenic precautions. Discussed with Dr Burr Medico, who will talk to the patient and order Granix  If patient agrees.    Breast cancer of upper-outer quadrant of left female breast [facility]) Recent chemotherapy. (Cycle 3 of  docetaxel and Cytoxan) . Did not receive Neulasta this cycle due to bone pain. Dr Burr Medico following.  Essential hypertension Stable. Continue home medication.   Hypokalemia  replenished    Code Status : Full code  Family Communication  :  None at bedside  Disposition Plan  : Home pending improvement in neutropenia, final blood culture results  Barriers For Discharge : Active symptoms  Consults  :  Oncology  Procedures  : None  DVT Prophylaxis  :  Lovenox -  Lab Results  Component Value Date   PLT 246 02/14/2016    Antibiotics  :    Anti-infectives    Start     Dose/Rate Route Frequency Ordered Stop   02/13/16 2000  vancomycin (VANCOCIN) 1,250 mg in sodium chloride 0.9 % 250 mL IVPB     1,250 mg 166.7 mL/hr over 90 Minutes Intravenous Every 12 hours 02/13/16 1137     02/13/16 0800  vancomycin (VANCOCIN) IVPB 1000 mg/200 mL premix  Status:  Discontinued     1,000 mg 200 mL/hr over 60 Minutes Intravenous Every 12 hours 02/12/16 1856 02/13/16 1137   02/13/16 0200  ceFEPIme (MAXIPIME) 2 g in dextrose 5 % 50 mL IVPB     2 g 100 mL/hr over 30 Minutes Intravenous Every 8 hours 02/12/16 1856     02/12/16 1830  vancomycin (VANCOCIN) 2,250 mg in sodium chloride 0.9 % 500 mL IVPB  Status:  Discontinued     2,250 mg 250 mL/hr over 120 Minutes Intravenous  Once 02/12/16 1815 02/12/16 1816   02/12/16 1830  vancomycin (VANCOCIN) 2,500 mg in sodium chloride 0.9 % 500 mL IVPB     2,500 mg 250 mL/hr over 120 Minutes Intravenous  Once 02/12/16 1816 02/12/16 2148   02/12/16 1800  ceFEPIme (MAXIPIME) 2 g in dextrose 5 % 50 mL IVPB     2 g 100 mL/hr over 30 Minutes Intravenous  Once 02/12/16 1751 02/12/16 1934        Objective:   Vitals:   02/13/16 0459 02/13/16 1437 02/13/16 2218 02/14/16 0521  BP: 103/64 106/73 123/76 (!) 107/47  Pulse: 84 90 88 80  Resp: 18 16 16 14   Temp: 98.9 F (37.2 C) 98.6 F (37 C) 99 F (37.2 C) 98.3 F (36.8 C)  TempSrc: Oral Oral Oral Oral  SpO2:  99% 100% 100% 100%  Weight: 122.7 kg (270 lb 8.1 oz)     Height: 5\' 5"  (1.651 m)       Wt Readings from Last 3 Encounters:  02/13/16 122.7 kg (270 lb 8.1 oz)  02/03/16 123.1 kg (271 lb 4.8 oz)  01/13/16 124.1 kg (273 lb 11.2 oz)     Intake/Output Summary (Last 24 hours) at 02/14/16 1257 Last data filed at 02/14/16 0930  Gross per 24 hour  Intake             1410 ml  Output                0 ml  Net             1410 ml     Physical Exam  Gen: not in distress HEENT: moist mucosa, supple neck Chest: clear b/l, no added sounds CVS: N S1&S2, no murmurs,  GI: soft, NT, ND, BS+ Musculoskeletal: warm, no edema,      Data Review:    CBC  Recent Labs Lab 02/10/16 0926 02/12/16 1825 02/13/16 0122 02/13/16 0938 02/14/16 0442  WBC 1.1* 1.6* 1.9* 1.5* 3.1*  HGB 12.7 11.2* 10.3* 10.0* 9.9*  HCT 37.2 32.7* 29.8* 29.2* 29.3*  PLT 312 283 257 229 246  MCV 85.1 85.6 84.2 84.4 85.9  MCH 29.1 29.3 29.1 28.9 29.0  MCHC 34.1 34.3 34.6 34.2 33.8  RDW 14.6* 14.7 14.4 14.5 14.6  LYMPHSABS 0.9 0.9  --  0.7 1.2  MONOABS 0.1 0.7  --  0.8  1.7*  EOSABS 0.0 0.0  --  0.0 0.0  BASOSABS 0.0 0.0  --  0.0 0.0    Chemistries   Recent Labs Lab 02/10/16 0926 02/12/16 1825 02/13/16 0122  NA 134* 134* 133*  K 4.2 3.5 3.3*  CL  --  101 104  CO2 24 24 22   GLUCOSE 100 121* 129*  BUN 16.0 10 8  CREATININE 0.8 0.86 0.73  CALCIUM 9.6 8.3* 8.1*  AST 20 22 20   ALT 25 23 20   ALKPHOS 105 81 68  BILITOT 0.87 0.8 0.9   ------------------------------------------------------------------------------------------------------------------ No results for input(s): CHOL, HDL, LDLCALC, TRIG, CHOLHDL, LDLDIRECT in the last 72 hours.  No results found for: HGBA1C ------------------------------------------------------------------------------------------------------------------ No results for input(s): TSH, T4TOTAL, T3FREE, THYROIDAB in the last 72 hours.  Invalid input(s):  FREET3 ------------------------------------------------------------------------------------------------------------------ No results for input(s): VITAMINB12, FOLATE, FERRITIN, TIBC, IRON, RETICCTPCT in the last 72 hours.  Coagulation profile  Recent Labs Lab 02/12/16 1825  INR 1.04    No results for input(s): DDIMER in the last 72 hours.  Cardiac Enzymes No results for input(s): CKMB, TROPONINI, MYOGLOBIN in the last 168 hours.  Invalid input(s): CK ------------------------------------------------------------------------------------------------------------------ No results found for: BNP  Inpatient Medications  Scheduled Meds: . ceFEPime (MAXIPIME) IV  2 g Intravenous Q8H  . enoxaparin (LOVENOX) injection  60 mg Subcutaneous QHS  . feeding supplement (ENSURE ENLIVE)  237 mL Oral BID BM  . irbesartan  75 mg Oral Daily  . Tbo-filgastrim (GRANIX) SQ  480 mcg Subcutaneous Daily  . vancomycin  1,250 mg Intravenous Q12H   Continuous Infusions: PRN Meds:.acetaminophen **OR** acetaminophen, HYDROcodone-acetaminophen, magic mouthwash w/lidocaine, ondansetron, prochlorperazine  Micro Results Recent Results (from the past 240 hour(s))  Culture, blood (Routine x 2)     Status: None (Preliminary result)   Collection Time: 02/12/16  6:19 PM  Result Value Ref Range Status   Specimen Description BLOOD RIGHT ANTECUBITAL  Final   Special Requests BOTTLES DRAWN AEROBIC AND ANAEROBIC 5CC  Final   Culture  Setup Time   Final    GRAM POSITIVE COCCI IN CLUSTERS IN BOTH AEROBIC AND ANAEROBIC BOTTLES CRITICAL RESULT CALLED TO, READ BACK BY AND VERIFIED WITH: T PICKERING,PHARMD AT 2043 02/13/16 BY T [place]    Culture   Final    GRAM POSITIVE COCCI CULTURE REINCUBATED FOR BETTER GROWTH Performed at [facility]    Report Status PENDING  Incomplete  Blood Culture ID Panel (Reflexed)     Status: Abnormal   Collection Time: 02/12/16  6:19 PM  Result Value Ref Range Status    Enterococcus species NOT DETECTED NOT DETECTED Final   Listeria monocytogenes NOT DETECTED NOT DETECTED Final   Staphylococcus species DETECTED (A) NOT DETECTED Final    Comment: CRITICAL RESULT CALLED TO, READ BACK BY AND VERIFIED WITH: TO TPICKERING(PHARD) BY TCLEVELAND 02/13/2016 AT 8:43PM    Staphylococcus aureus NOT DETECTED NOT DETECTED Final   Methicillin resistance DETECTED (A) NOT DETECTED Corrected    Comment: CRITICAL RESULT CALLED TO, READ BACK BY AND VERIFIED WITH: TO TPICKERING(PHARD) BY TCLEVELAND 02/13/2016 AT 8:43PM CORRECTED ON 12/18 AT 0915: PREVIOUSLY REPORTED AS DETECTED TO TPICKERING(PHARD) BY TCLEVELAND 02/13/2016 AT 8:43PM    Streptococcus species NOT DETECTED NOT DETECTED Final   Streptococcus agalactiae NOT DETECTED NOT DETECTED Final   Streptococcus pneumoniae NOT DETECTED NOT DETECTED Final   Streptococcus pyogenes NOT DETECTED NOT DETECTED Final   Acinetobacter baumannii NOT DETECTED NOT DETECTED Final   Enterobacteriaceae species NOT DETECTED NOT DETECTED Final  Enterobacter cloacae complex NOT DETECTED NOT DETECTED Final   Escherichia coli NOT DETECTED NOT DETECTED Final   Klebsiella oxytoca NOT DETECTED NOT DETECTED Final   Klebsiella pneumoniae NOT DETECTED NOT DETECTED Final   Proteus species NOT DETECTED NOT DETECTED Final   Serratia marcescens NOT DETECTED NOT DETECTED Final   Haemophilus influenzae NOT DETECTED NOT DETECTED Final   Neisseria meningitidis NOT DETECTED NOT DETECTED Final   Pseudomonas aeruginosa NOT DETECTED NOT DETECTED Final   Candida albicans NOT DETECTED NOT DETECTED Final   Candida glabrata NOT DETECTED NOT DETECTED Final   Candida krusei NOT DETECTED NOT DETECTED Final   Candida parapsilosis NOT DETECTED NOT DETECTED Final   Candida tropicalis NOT DETECTED NOT DETECTED Final    Comment: Performed at [facility]  Urine culture     Status: Abnormal   Collection Time: 02/12/16  7:00 PM  Result Value Ref Range Status    Specimen Description URINE, CLEAN CATCH  Final   Special Requests NONE  Final   Culture MULTIPLE SPECIES PRESENT, SUGGEST RECOLLECTION (A)  Final   Report Status 02/14/2016 FINAL  Final    Radiology Reports Dg Chest 2 View  Result Date: 02/12/2016 CLINICAL DATA:  Initial evaluation for acute fever. Shortness of breath. EXAM: CHEST  2 VIEW COMPARISON:  None available. FINDINGS: Cardiac and mediastinal silhouettes are within normal limits. Lungs are normally inflated. There is patchy opacity within the retrocardiac left lower lobe, suspicious for possible small infiltrate given the provided history. Superimposed mild peribronchial thickening present. No other focal airspace disease. No pulmonary edema or pleural effusion. No pneumothorax. No acute osseous abnormality. IMPRESSION: 1. Mild patchy retrocardiac left lower lobe opacity, suspicious for small focal infiltrate given the provided history. 2. Superimposed mild diffuse peribronchial thickening. Electronically Signed   By: Jeannine Boga M.D.   On: 02/12/2016 18:49    Time Spent in minutes  25   Louellen Molder M.D on 02/14/2016 at 12:57 PM  Between 7am to 7pm - Pager - [phone]  After 7pm go to [email or web address] - password [facility]  [facility] -  Office  [phone]

## 2016-02-14 NOTE — Progress Notes (Signed)
Initial Nutrition Assessment  DOCUMENTATION CODES:   Morbid obesity  INTERVENTION:   -Encouraged pt to request nutritional supplements and snacks from unit if desired. -Encouraged small, frequent meals -RD to continue to monitor for needs  NUTRITION DIAGNOSIS:   Increased nutrient needs related to cancer and cancer related treatments as evidenced by estimated needs.  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 She completed her recent cycle about 10 days back. She did not received Neulasta after the cycle due to severe bone pain. Patient came to the hospital with fever of 101.35F, denied cough with shortness of breath and chest discomfort.  Patient in room with husband and other family member. Pt reports eating well despite mouth sores on the roof of her mouth. Pt states she uses Magic mouthwash and Biotene at home which helps. She denies any swallowing or chewing issues. She also states she has been having some taste changes, "nothing taste right". Pt has been ordered Ensure supplements but does not prefer them. Encouraged pt if she is not eating well that supplements are available on the unit.  Per chart review, pt has gained weight over the past 4 months. Nutrition focused physical exam shows no sign of depletion of muscle mass or body fat.  Medications: Magic Mouthwash w/ Lidocaine PRN  Labs reviewed: Low Na, K  Diet Order:  Diet Heart Room service appropriate? Yes; Fluid consistency: Thin  Skin:  Reviewed, no issues  Last BM:  12/17  Height:   Ht Readings from Last 1 Encounters:  02/13/16 5\' 5"  (1.651 m)    Weight:   Wt Readings from Last 1 Encounters:  02/13/16  270 lb 8.1 oz (122.7 kg)    Ideal Body Weight:  56.8 kg  BMI:  Body mass index is 45.01 kg/m.  Estimated Nutritional Needs:   Kcal:  1850-2050  Protein:  85-95g  Fluid:  2L/day  EDUCATION NEEDS:   No education needs identified at this time  Clayton Bibles, MS, RD, LDN Pager: [phone] After Hours Pager: [phone]

## 2016-02-14 NOTE — Progress Notes (Signed)
Nicole Schmitt   DOB:1960/09/10   JT#:[id number]   ZES#:[id number]  ONCOLOGY FOLLOW UP   Subjective: The patient is well known to me, currently on adjuvant chemotherapy for breast cancer, admitted for neutropenic fever and pneumonia. She feels better today overall, afebrile, vital signs stable.  Objective:  Vitals:   02/14/16 0521 02/14/16 1414  BP: (!) 107/47 130/88  Pulse: 80 96  Resp: 14 16  Temp: 98.3 F (36.8 C) 99 F (37.2 C)    Body mass index is 45.01 kg/m.  Intake/Output Summary (Last 24 hours) at 02/14/16 2003 Last data filed at 02/14/16 1856  Gross per 24 hour  Intake             1890 ml  Output                0 ml  Net             1890 ml     Sclerae unicteric  Oropharynx clear  No peripheral adenopathy  Lungs clear -- no rales or rhonchi  Heart regular rate and rhythm  Abdomen benign  MSK no focal spinal tenderness, no peripheral edema  Neuro nonfocal    CBG (last 3)  No results for input(s): GLUCAP in the last 72 hours.   Labs:  Lab Results  Component Value Date   WBC 3.1 (L) 02/14/2016   HGB 9.9 (L) 02/14/2016   HCT 29.3 (L) 02/14/2016   MCV 85.9 02/14/2016   PLT 246 02/14/2016   NEUTROABS 0.2 (L) 02/14/2016   CMP Latest Ref Rng & Units 02/13/2016 02/12/2016 02/10/2016  Glucose 65 - 99 mg/dL 129(H) 121(H) 100  BUN 6 - 20 mg/dL 8 10 16.0  Creatinine 0.44 - 1.00 mg/dL 0.73 0.86 0.8  Sodium 135 - 145 mmol/L 133(L) 134(L) 134(L)  Potassium 3.5 - 5.1 mmol/L 3.3(L) 3.5 4.2  Chloride 101 - 111 mmol/L 104 101 -  CO2 22 - 32 mmol/L _0 Calcium 8.9 - 10.3 mg/dL 8.1(L) 8.3(L) 9.6  Total Protein 6.5 - 8.1 g/dL 5.7(L) 6.3(L) 6.6  Total Bilirubin 0.3 - 1.2 mg/dL 0.9 0.8 0.87  Alkaline Phos 38 - 126 U/L 68 81 105  AST 15 - 41 U/L _1 ALT 14 - 54 U/L _2 Urine Studies No results for input(s): UHGB, CRYS in the last 72 hours.  Invalid input(s): UACOL, UAPR, USPG, UPH, UTP, UGL, UKET, UBIL, UNIT, UROB, ULEU, UEPI, UWBC, URBC,  UBAC, CAST, [place], [place]  Basic Metabolic Panel:  Recent Labs Lab 02/10/16 0926 02/12/16 1825 02/13/16 0122  NA 134* 134* 133*  K 4.2 3.5 3.3*  CL  --  101 104  CO2 _3 GLUCOSE 100 121* 129*  BUN 16.0 10 8  CREATININE 0.8 0.86 0.73  CALCIUM 9.6 8.3* 8.1*   GFR Estimated Creatinine Clearance: 106.9 mL/min (by C-G formula based on SCr of 0.73 mg/dL). Liver Function Tests:  Recent Labs Lab 02/10/16 0926 02/12/16 1825 02/13/16 0122  AST _4 ALT _5 ALKPHOS 105 81 68  BILITOT 0.87 0.8 0.9  PROT 6.6 6.3* 5.7*  ALBUMIN 3.2* 3.2* 3.0*   No results for input(s): LIPASE, AMYLASE in the last 168 hours. No results for input(s): AMMONIA in the last 168 hours. Coagulation profile  Recent Labs Lab 02/12/16 1825  INR 1.04    CBC:  Recent Labs Lab 02/10/16 0926 02/12/16 1825 02/13/16 0122 02/13/16 2633  02/14/16 0442  WBC 1.1* 1.6* 1.9* 1.5* 3.1*  NEUTROABS 0.1* 0.0*  --  0.0* 0.2*  HGB 12.7 11.2* 10.3* 10.0* 9.9*  HCT 37.2 32.7* 29.8* 29.2* 29.3*  MCV 85.1 85.6 84.2 84.4 85.9  PLT 312 283 257 229 246   Cardiac Enzymes: No results for input(s): CKTOTAL, CKMB, CKMBINDEX, TROPONINI in the last 168 hours. BNP: Invalid input(s): POCBNP CBG: No results for input(s): GLUCAP in the last 168 hours. D-Dimer No results for input(s): DDIMER in the last 72 hours. Hgb A1c No results for input(s): HGBA1C in the last 72 hours. Lipid Profile No results for input(s): CHOL, HDL, LDLCALC, TRIG, CHOLHDL, LDLDIRECT in the last 72 hours. Thyroid function studies No results for input(s): TSH, T4TOTAL, T3FREE, THYROIDAB in the last 72 hours.  Invalid input(s): FREET3 Anemia work up No results for input(s): VITAMINB12, FOLATE, FERRITIN, TIBC, IRON, RETICCTPCT in the last 72 hours. Microbiology Recent Results (from the past 240 hour(s))  Culture, blood (Routine x 2)     Status: Abnormal (Preliminary result)   Collection Time: 02/12/16  6:19 PM  Result Value Ref  Range Status   Specimen Description BLOOD RIGHT ANTECUBITAL  Final   Special Requests BOTTLES DRAWN AEROBIC AND ANAEROBIC 5CC  Final   Culture  Setup Time   Final    GRAM POSITIVE COCCI IN CLUSTERS IN BOTH AEROBIC AND ANAEROBIC BOTTLES CRITICAL RESULT CALLED TO, READ BACK BY AND VERIFIED WITH: T PICKERING,PHARMD AT 2043 02/13/16 BY T [place] Performed at [facility]    Culture STAPHYLOCOCCUS SPECIES (COAGULASE NEGATIVE) (A)  Final   Report Status PENDING  Incomplete  Blood Culture ID Panel (Reflexed)     Status: Abnormal   Collection Time: 02/12/16  6:19 PM  Result Value Ref Range Status   Enterococcus species NOT DETECTED NOT DETECTED Final   Listeria monocytogenes NOT DETECTED NOT DETECTED Final   Staphylococcus species DETECTED (A) NOT DETECTED Final    Comment: CRITICAL RESULT CALLED TO, READ BACK BY AND VERIFIED WITH: TO TPICKERING(PHARD) BY TCLEVELAND 02/13/2016 AT 8:43PM    Staphylococcus aureus NOT DETECTED NOT DETECTED Final   Methicillin resistance DETECTED (A) NOT DETECTED Corrected    Comment: CRITICAL RESULT CALLED TO, READ BACK BY AND VERIFIED WITH: TO TPICKERING(PHARD) BY TCLEVELAND 02/13/2016 AT 8:43PM CORRECTED ON 12/18 AT 0915: PREVIOUSLY REPORTED AS DETECTED TO TPICKERING(PHARD) BY TCLEVELAND 02/13/2016 AT 8:43PM    Streptococcus species NOT DETECTED NOT DETECTED Final   Streptococcus agalactiae NOT DETECTED NOT DETECTED Final   Streptococcus pneumoniae NOT DETECTED NOT DETECTED Final   Streptococcus pyogenes NOT DETECTED NOT DETECTED Final   Acinetobacter baumannii NOT DETECTED NOT DETECTED Final   Enterobacteriaceae species NOT DETECTED NOT DETECTED Final   Enterobacter cloacae complex NOT DETECTED NOT DETECTED Final   Escherichia coli NOT DETECTED NOT DETECTED Final   Klebsiella oxytoca NOT DETECTED NOT DETECTED Final   Klebsiella pneumoniae NOT DETECTED NOT DETECTED Final   Proteus species NOT DETECTED NOT DETECTED Final   Serratia marcescens NOT  DETECTED NOT DETECTED Final   Haemophilus influenzae NOT DETECTED NOT DETECTED Final   Neisseria meningitidis NOT DETECTED NOT DETECTED Final   Pseudomonas aeruginosa NOT DETECTED NOT DETECTED Final   Candida albicans NOT DETECTED NOT DETECTED Final   Candida glabrata NOT DETECTED NOT DETECTED Final   Candida krusei NOT DETECTED NOT DETECTED Final   Candida parapsilosis NOT DETECTED NOT DETECTED Final   Candida tropicalis NOT DETECTED NOT DETECTED Final    Comment: Performed at [facility]  Culture, blood (Routine x 2)     Status: None (Preliminary result)   Collection Time: 02/12/16  6:20 PM  Result Value Ref Range Status   Specimen Description BLOOD RIGHT HAND  Final   Special Requests BOTTLES DRAWN AEROBIC AND ANAEROBIC 5CC  Final   Culture   Final    NO GROWTH 1 DAY Performed at [facility]    Report Status PENDING  Incomplete  Urine culture     Status: Abnormal   Collection Time: 02/12/16  7:00 PM  Result Value Ref Range Status   Specimen Description URINE, CLEAN CATCH  Final   Special Requests NONE  Final   Culture MULTIPLE SPECIES PRESENT, SUGGEST RECOLLECTION (A)  Final   Report Status 02/14/2016 FINAL  Final      Studies:  No results found.  Assessment: 55 y.o. female  1. Neutropenic fever, secondary to chemo, improving  2. Left lobe pneumonia, on vanco and cefepime  3. MRSA bacteriemia in one out of 2 blood cultures 4. Stage IIA left breast cancer, ER+/PR+/HER2-, on adjuvant chemo, last cycle 12/7 w/o neulasta  5. HTN    Plan:  -Patient is on broad antibiotics, which covers MRSA, overall improved, afebrile now -She did not receive Neulasta from last cycle chemotherapy due to previous bone pain, she is still quite neutropenic, I recommend Granix 472mg daily until AHudsonclose to 1K, she agrees, I ordered, will start today  -her one out of two blood cultures was positive for MRSA, the other one negative so far, will need repeat blood culture.  Although skin contamination is a possibility, in the setting of severe neutropenia, this should be treated as real bacteriemia  -we may need to postpone her next cycle which is scheduled for next week 12/28 -will follow up  -appreciate the excellent care from hospitalist team, I spoke with Dr. DHope Pigeontoday     FTruitt Merle MD 02/14/2016  8:03 PM

## 2016-02-15 ENCOUNTER — Telehealth: Payer: Self-pay | Admitting: Hematology

## 2016-02-15 DIAGNOSIS — C50412 Malignant neoplasm of upper-outer quadrant of left female breast: Secondary | ICD-10-CM

## 2016-02-15 DIAGNOSIS — J181 Lobar pneumonia, unspecified organism: Principal | ICD-10-CM | POA: Diagnosis present

## 2016-02-15 LAB — CBC WITH DIFFERENTIAL/PLATELET
BASOS ABS: 0.1 10*3/uL (ref 0.0–0.1)
BASOS PCT: 1 %
EOS ABS: 0 10*3/uL (ref 0.0–0.7)
EOS PCT: 0 %
HCT: 29.3 % — ABNORMAL LOW (ref 36.0–46.0)
Hemoglobin: 9.9 g/dL — ABNORMAL LOW (ref 12.0–15.0)
LYMPHS ABS: 1.4 10*3/uL (ref 0.7–4.0)
Lymphocytes Relative: 14 %
MCH: 28.5 pg (ref 26.0–34.0)
MCHC: 33.8 g/dL (ref 30.0–36.0)
MCV: 84.4 fL (ref 78.0–100.0)
MONOS PCT: 25 %
Monocytes Absolute: 2.6 10*3/uL — ABNORMAL HIGH (ref 0.1–1.0)
NEUTROS ABS: 6.1 10*3/uL (ref 1.7–7.7)
NEUTROS PCT: 60 %
PLATELETS: 234 10*3/uL (ref 150–400)
RBC: 3.47 MIL/uL — AB (ref 3.87–5.11)
RDW: 14.5 % (ref 11.5–15.5)
WBC: 10.2 10*3/uL (ref 4.0–10.5)

## 2016-02-15 MED ORDER — LEVOFLOXACIN 750 MG PO TABS: 750.0000 mg | ORAL_TABLET | Freq: Every day | ORAL | 0 refills | Status: AC

## 2016-02-15 MED ORDER — ENSURE ENLIVE PO LIQD: 237.0000 mL | Freq: Two times a day (BID) | ORAL | 12 refills | Status: DC

## 2016-02-15 NOTE — Discharge Instructions (Signed)
Community-Acquired Pneumonia, Adult °Introduction °Pneumonia is an infection of the lungs. One type of pneumonia can happen while a person is in a hospital. A different type can happen when a person is not in a hospital (community-acquired pneumonia). It is easy for this kind to spread from person to person. It can spread to you if you breathe near an infected person who coughs or sneezes. Some symptoms include: °· A dry cough. °· A wet (productive) cough. °· Fever. °· Sweating. °· Chest pain. °Follow these instructions at home: °· Take over-the-counter and prescription medicines only as told by your doctor. °¨ Only take cough medicine if you are losing sleep. °¨ If you were prescribed an antibiotic medicine, take it as told by your doctor. Do not stop taking the antibiotic even if you start to feel better. °· Sleep with your head and neck raised (elevated). You can do this by putting a few pillows under your head, or you can sleep in a recliner. °· Do not use tobacco products. These include cigarettes, chewing tobacco, and e-cigarettes. If you need help quitting, ask your doctor. °· Drink enough water to keep your pee (urine) clear or pale yellow. °A shot (vaccine) can help prevent pneumonia. Shots are often suggested for: °· People older than 53 years of age. °· People older than 53 years of age: °¨ Who are having cancer treatment. °¨ Who have long-term (chronic) lung disease. °¨ Who have problems with their body's defense system (immune system). °You may also prevent pneumonia if you take these actions: °· Get the flu (influenza) shot every year. °· Go to the dentist as often as told. °· Wash your hands often. If soap and water are not available, use hand sanitizer. °Contact a doctor if: °· You have a fever. °· You lose sleep because your cough medicine does not help. °Get help right away if: °· You are short of breath and it gets worse. °· You have more chest pain. °· Your sickness gets worse. This is very  serious if: °¨ You are an older adult. °¨ Your body's defense system is weak. °· You cough up blood. °This information is not intended to replace advice given to you by your health care provider. Make sure you discuss any questions you have with your health care provider. °Document Released: 08/02/2007 Document Revised: 07/22/2015 Document Reviewed: 06/10/2014 °© 2017 [employer] ° °

## 2016-02-15 NOTE — Telephone Encounter (Signed)
Per YF 12/27 f/u moved to CB. Lab/chemo adjusted accordingly. Left message for patient.

## 2016-02-15 NOTE — Discharge Summary (Addendum)
Physician Discharge Summary  Nicole Schmitt [id number] DOB: November 06, 1960 DOA: 02/12/2016  PCP: Pcp Not In System  Admit date: 02/12/2016 Discharge date: 02/15/2016  Admitted From: home Disposition:  Home  Recommendations for Outpatient Follow-up:  1. Follow up with Dr Burr Medico as Scheduled. 2. Patient did completed 7 day course of antibiotics on 12/24.  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 She completed her recent cycle about 10 days back. She did not received Neulasta after the cycle due to severe bone pain. Patient came to the hospital with fever of 101.37F, denied cough with shortness of breath and chest discomfort. She also noticed small bump in her right axilla.  In the ED she was found to have a low-grade fever and tachycardic. She was found to be neutropenia and mildly elevated lactic acid. with chest x-ray showing left retrocardiac opacity suggestive of pneumonia.  Hospital course  Febrile neutropenia ([place]) Possibly secondary to left lobar pneumonia and recent chemotherapy. ANC Improved and neutropenia resolved after receiving Granix on 12/18. Remains afebrile.    Healthcare associated pneumonia ([place]) Received empiric vancomycin and cefepime. 1/2 blood culture on admission grew staph species which appears to be a contaminant. Patient remains afebrile and asymptomatic. ANC improved. Will discharge on oral Levaquin to complete 7 day course of antibiotic.     Breast cancer of upper-outer quadrant of left female breast [facility]) Recent chemotherapy. (Cycle 3 of docetaxel and Cytoxan) . Given granix on 12/18 for neutropenia.  Essential hypertension Stable. Continue home medication.   Hypokalemia  replenished  Morbid obesity   Patient stable to be discharged home. Discussed with Dr Burr Medico who agrees with the plan.  Family Communication  :  None at bedside  Disposition Plan  : Home   Consults  :  Oncology( Dr Burr Medico)  Procedures  : None  Discharge Instructions   Allergies as of 02/15/2016   No Known Allergies     Medication List    STOP taking these medications   doxycycline 100 MG capsule Commonly known as:  VIBRAMYCIN   zolpidem 5 MG tablet Commonly known as:  AMBIEN     TAKE these medications   CLINDAGEL 1 % gel Generic drug:  clindamycin Apply 1 application topically daily.   dexamethasone 4 MG tablet Commonly known as:  DECADRON Take 2 tablets (8 mg total) by mouth 2 (two) times daily. Start the day before Taxotere. Then again the day after chemo for 3 days.   feeding supplement (ENSURE ENLIVE) Liqd Take 237 mLs by mouth 2 (two) times daily between meals.   hydrochlorothiazide 25 MG tablet Commonly known as:  HYDRODIURIL Take 25 mg by mouth daily. 12.5 mg to 25 mg dependent on bp medication   HYDROcodone-acetaminophen 5-325 MG tablet Commonly known as:  NORCO/VICODIN Take 1-2 tablets by mouth every 6 (six) hours as needed for moderate pain or severe pain.   levofloxacin 750 MG tablet Commonly known as:  LEVAQUIN Take 1 tablet (750 mg total) by mouth daily.   magic mouthwash w/lidocaine Soln Take 5 mLs by mouth 4 (  four) times daily as needed for mouth pain. Swish and  Swallow  Or   Swish and  Spit.   ondansetron 8 MG tablet Commonly known as:  ZOFRAN Take 1 tablet (8 mg total) by mouth 2 (two) times daily as needed for refractory nausea / vomiting. Start on day 3 after chemo.   prochlorperazine 10  MG tablet Commonly known as:  COMPAZINE Take 1 tablet (10 mg total) by mouth every 6 (six) hours as needed (Nausea or vomiting).   valsartan 160 MG tablet Commonly known as:  DIOVAN Take 1 tablet (160 mg total) by mouth daily.      Follow-up Information    [name], MD Follow up.   Specialties:  Hematology, Oncology Why:  AS SCHEDULED Contact information: [place] [id number] [phone]          No Known Allergies      Procedures/Studies: Dg Chest 2 View  Result Date: 02/12/2016 CLINICAL DATA:  Initial evaluation for acute fever. Shortness of breath. EXAM: CHEST  2 VIEW COMPARISON:  None available. FINDINGS: Cardiac and mediastinal silhouettes are within normal limits. Lungs are normally inflated. There is patchy opacity within the retrocardiac left lower lobe, suspicious for possible small infiltrate given the provided history. Superimposed mild peribronchial thickening present. No other focal airspace disease. No pulmonary edema or pleural effusion. No pneumothorax. No acute osseous abnormality. IMPRESSION: 1. Mild patchy retrocardiac left lower lobe opacity, suspicious for small focal infiltrate given the provided history. 2. Superimposed mild diffuse peribronchial thickening. Electronically Signed   By: Jeannine Boga M.D.   On: 02/12/2016 18:49    Subjective: Feels better. No overnight issues. Remains afebrile  Discharge Exam: Vitals:   02/14/16 2130 02/15/16 0541  BP: 114/62 106/60  Pulse: 85 85  Resp: 16 16  Temp: 99.3 F (37.4 C) 98.4 F (36.9 C)   Vitals:   02/14/16 1414 02/14/16 2130 02/15/16 0328 02/15/16 0541  BP: 130/88 114/62  106/60  Pulse: 96 85  85  Resp: 16 16  16   Temp: 99 F (37.2 C) 99.3 F (37.4 C)  98.4 F (36.9 C)  TempSrc: Oral Oral  Oral  SpO2: 100% 99%  98%  Weight:   122.9 kg (271 lb)   Height:        Gen: not in distress HEENT: moist mucosa, supple neck, no oral thrush Chest: clear b/l, no added  sounds CVS: N S1&S2, no murmurs,  GI: soft, NT, ND, Musculoskeletal: warm, no edema,     The results of significant diagnostics from this hospitalization (including imaging, microbiology, ancillary and laboratory) are listed below for reference.     Microbiology: Recent Results (from the past 240 hour(s))  Culture, blood (Routine x 2)     Status: Abnormal (Preliminary result)   Collection Time: 02/12/16  6:19 PM  Result Value Ref Range Status   Specimen Description BLOOD RIGHT ANTECUBITAL  Final   Special Requests BOTTLES DRAWN AEROBIC AND ANAEROBIC 5CC  Final   Culture  Setup Time   Final    GRAM POSITIVE COCCI IN CLUSTERS IN BOTH AEROBIC AND ANAEROBIC BOTTLES CRITICAL RESULT CALLED TO, READ BACK BY AND VERIFIED WITH: T PICKERING,PHARMD AT 2043 02/13/16 BY T [place] Performed at [facility]    Culture STAPHYLOCOCCUS SPECIES (COAGULASE NEGATIVE) (A)  Final   Report Status PENDING  Incomplete  Blood Culture ID Panel (Reflexed)     Status: Abnormal   Collection Time: 02/12/16  6:19 PM  Result  Value Ref Range Status   Enterococcus species NOT DETECTED NOT DETECTED Final   Listeria monocytogenes NOT DETECTED NOT DETECTED Final   Staphylococcus species DETECTED (A) NOT DETECTED Final    Comment: CRITICAL RESULT CALLED TO, READ BACK BY AND VERIFIED WITH: TO TPICKERING(PHARD) BY TCLEVELAND 02/13/2016 AT 8:43PM    Staphylococcus aureus NOT DETECTED NOT DETECTED Final   Methicillin resistance DETECTED (A) NOT DETECTED Corrected    Comment: CRITICAL RESULT CALLED TO, READ BACK BY AND VERIFIED WITH: TO TPICKERING(PHARD) BY TCLEVELAND 02/13/2016 AT 8:43PM CORRECTED ON 12/18 AT 0915: PREVIOUSLY REPORTED AS DETECTED TO TPICKERING(PHARD) BY TCLEVELAND 02/13/2016 AT 8:43PM    Streptococcus species NOT DETECTED NOT DETECTED Final   Streptococcus agalactiae NOT DETECTED NOT DETECTED Final   Streptococcus pneumoniae NOT DETECTED NOT DETECTED Final   Streptococcus pyogenes NOT  DETECTED NOT DETECTED Final   Acinetobacter baumannii NOT DETECTED NOT DETECTED Final   Enterobacteriaceae species NOT DETECTED NOT DETECTED Final   Enterobacter cloacae complex NOT DETECTED NOT DETECTED Final   Escherichia coli NOT DETECTED NOT DETECTED Final   Klebsiella oxytoca NOT DETECTED NOT DETECTED Final   Klebsiella pneumoniae NOT DETECTED NOT DETECTED Final   Proteus species NOT DETECTED NOT DETECTED Final   Serratia marcescens NOT DETECTED NOT DETECTED Final   Haemophilus influenzae NOT DETECTED NOT DETECTED Final   Neisseria meningitidis NOT DETECTED NOT DETECTED Final   Pseudomonas aeruginosa NOT DETECTED NOT DETECTED Final   Candida albicans NOT DETECTED NOT DETECTED Final   Candida glabrata NOT DETECTED NOT DETECTED Final   Candida krusei NOT DETECTED NOT DETECTED Final   Candida parapsilosis NOT DETECTED NOT DETECTED Final   Candida tropicalis NOT DETECTED NOT DETECTED Final    Comment: Performed at [facility]  Culture, blood (Routine x 2)     Status: None (Preliminary result)   Collection Time: 02/12/16  6:20 PM  Result Value Ref Range Status   Specimen Description BLOOD RIGHT HAND  Final   Special Requests BOTTLES DRAWN AEROBIC AND ANAEROBIC 5CC  Final   Culture   Final    NO GROWTH 1 DAY Performed at [facility]    Report Status PENDING  Incomplete  Urine culture     Status: Abnormal   Collection Time: 02/12/16  7:00 PM  Result Value Ref Range Status   Specimen Description URINE, CLEAN CATCH  Final   Special Requests NONE  Final   Culture MULTIPLE SPECIES PRESENT, SUGGEST RECOLLECTION (A)  Final   Report Status 02/14/2016 FINAL  Final     Labs: BNP (last 3 results) No results for input(s): BNP in the last 8760 hours. Basic Metabolic Panel:  Recent Labs Lab 02/10/16 0926 02/12/16 1825 02/13/16 0122  NA 134* 134* 133*  K 4.2 3.5 3.3*  CL  --  101 104  CO2 24 24 22   GLUCOSE 100 121* 129*  BUN 16.0 10 8  CREATININE 0.8 0.86 0.73   CALCIUM 9.6 8.3* 8.1*   Liver Function Tests:  Recent Labs Lab 02/10/16 0926 02/12/16 1825 02/13/16 0122  AST 20 22 20   ALT 25 23 20   ALKPHOS 105 81 68  BILITOT 0.87 0.8 0.9  PROT 6.6 6.3* 5.7*  ALBUMIN 3.2* 3.2* 3.0*   No results for input(s): LIPASE, AMYLASE in the last 168 hours. No results for input(s): AMMONIA in the last 168 hours. CBC:  Recent Labs Lab 02/10/16 0926 02/12/16 1825 02/13/16 0122 02/13/16 0938 02/14/16 0442 02/15/16 0345  WBC 1.1* 1.6* 1.9* 1.5* 3.1*  10.2  NEUTROABS 0.1* 0.0*  --  0.0* 0.2* 6.1  HGB 12.7 11.2* 10.3* 10.0* 9.9* 9.9*  HCT 37.2 32.7* 29.8* 29.2* 29.3* 29.3*  MCV 85.1 85.6 84.2 84.4 85.9 84.4  PLT 312 283 257 229 246 234   Cardiac Enzymes: No results for input(s): CKTOTAL, CKMB, CKMBINDEX, TROPONINI in the last 168 hours. BNP: Invalid input(s): POCBNP CBG: No results for input(s): GLUCAP in the last 168 hours. D-Dimer No results for input(s): DDIMER in the last 72 hours. Hgb A1c No results for input(s): HGBA1C in the last 72 hours. Lipid Profile No results for input(s): CHOL, HDL, LDLCALC, TRIG, CHOLHDL, LDLDIRECT in the last 72 hours. Thyroid function studies No results for input(s): TSH, T4TOTAL, T3FREE, THYROIDAB in the last 72 hours.  Invalid input(s): FREET3 Anemia work up No results for input(s): VITAMINB12, FOLATE, FERRITIN, TIBC, IRON, RETICCTPCT in the last 72 hours. Urinalysis    Component Value Date/Time   COLORURINE YELLOW 02/12/2016 1900   APPEARANCEUR HAZY (A) 02/12/2016 1900   LABSPEC 1.021 02/12/2016 1900   LABSPEC 1.030 01/13/2016 0846   PHURINE 5.0 02/12/2016 1900   GLUCOSEU NEGATIVE 02/12/2016 1900   GLUCOSEU 2,000 01/13/2016 0846   HGBUR NEGATIVE 02/12/2016 1900   BILIRUBINUR NEGATIVE 02/12/2016 1900   BILIRUBINUR Negative 01/13/2016 0846   KETONESUR NEGATIVE 02/12/2016 1900   PROTEINUR NEGATIVE 02/12/2016 1900   UROBILINOGEN 0.2 01/13/2016 0846   NITRITE NEGATIVE 02/12/2016 1900    LEUKOCYTESUR NEGATIVE 02/12/2016 1900   LEUKOCYTESUR Negative 01/13/2016 0846   Sepsis Labs Invalid input(s): PROCALCITONIN,  WBC,  LACTICIDVEN Microbiology Recent Results (from the past 240 hour(s))  Culture, blood (Routine x 2)     Status: Abnormal (Preliminary result)   Collection Time: 02/12/16  6:19 PM  Result Value Ref Range Status   Specimen Description BLOOD RIGHT ANTECUBITAL  Final   Special Requests BOTTLES DRAWN AEROBIC AND ANAEROBIC 5CC  Final   Culture  Setup Time   Final    GRAM POSITIVE COCCI IN CLUSTERS IN BOTH AEROBIC AND ANAEROBIC BOTTLES CRITICAL RESULT CALLED TO, READ BACK BY AND VERIFIED WITH: T PICKERING,PHARMD AT 2043 02/13/16 BY T [place] Performed at [facility]    Culture STAPHYLOCOCCUS SPECIES (COAGULASE NEGATIVE) (A)  Final   Report Status PENDING  Incomplete  Blood Culture ID Panel (Reflexed)     Status: Abnormal   Collection Time: 02/12/16  6:19 PM  Result Value Ref Range Status   Enterococcus species NOT DETECTED NOT DETECTED Final   Listeria monocytogenes NOT DETECTED NOT DETECTED Final   Staphylococcus species DETECTED (A) NOT DETECTED Final    Comment: CRITICAL RESULT CALLED TO, READ BACK BY AND VERIFIED WITH: TO TPICKERING(PHARD) BY TCLEVELAND 02/13/2016 AT 8:43PM    Staphylococcus aureus NOT DETECTED NOT DETECTED Final   Methicillin resistance DETECTED (A) NOT DETECTED Corrected    Comment: CRITICAL RESULT CALLED TO, READ BACK BY AND VERIFIED WITH: TO TPICKERING(PHARD) BY TCLEVELAND 02/13/2016 AT 8:43PM CORRECTED ON 12/18 AT 0915: PREVIOUSLY REPORTED AS DETECTED TO TPICKERING(PHARD) BY TCLEVELAND 02/13/2016 AT 8:43PM    Streptococcus species NOT DETECTED NOT DETECTED Final   Streptococcus agalactiae NOT DETECTED NOT DETECTED Final   Streptococcus pneumoniae NOT DETECTED NOT DETECTED Final   Streptococcus pyogenes NOT DETECTED NOT DETECTED Final   Acinetobacter baumannii NOT DETECTED NOT DETECTED Final   Enterobacteriaceae species  NOT DETECTED NOT DETECTED Final   Enterobacter cloacae complex NOT DETECTED NOT DETECTED Final   Escherichia coli NOT DETECTED NOT DETECTED Final   Klebsiella oxytoca NOT  DETECTED NOT DETECTED Final   Klebsiella pneumoniae NOT DETECTED NOT DETECTED Final   Proteus species NOT DETECTED NOT DETECTED Final   Serratia marcescens NOT DETECTED NOT DETECTED Final   Haemophilus influenzae NOT DETECTED NOT DETECTED Final   Neisseria meningitidis NOT DETECTED NOT DETECTED Final   Pseudomonas aeruginosa NOT DETECTED NOT DETECTED Final   Candida albicans NOT DETECTED NOT DETECTED Final   Candida glabrata NOT DETECTED NOT DETECTED Final   Candida krusei NOT DETECTED NOT DETECTED Final   Candida parapsilosis NOT DETECTED NOT DETECTED Final   Candida tropicalis NOT DETECTED NOT DETECTED Final    Comment: Performed at [facility]  Culture, blood (Routine x 2)     Status: None (Preliminary result)   Collection Time: 02/12/16  6:20 PM  Result Value Ref Range Status   Specimen Description BLOOD RIGHT HAND  Final   Special Requests BOTTLES DRAWN AEROBIC AND ANAEROBIC 5CC  Final   Culture   Final    NO GROWTH 1 DAY Performed at [facility]    Report Status PENDING  Incomplete  Urine culture     Status: Abnormal   Collection Time: 02/12/16  7:00 PM  Result Value Ref Range Status   Specimen Description URINE, CLEAN CATCH  Final   Special Requests NONE  Final   Culture MULTIPLE SPECIES PRESENT, SUGGEST RECOLLECTION (A)  Final   Report Status 02/14/2016 FINAL  Final     Time coordinating discharge: Over 30 minutes  SIGNED:   Louellen Molder, MD  [facility] 02/15/2016, 12:07 PM Pager   If 7PM-7AM, please contact night-coverage [email or web address] Password [email or web address]

## 2016-02-15 NOTE — Progress Notes (Signed)
DC instructions reviewed, all questions and concerns were addressed. Pt to follow up with Dr. Burr Medico, PO antibiotic prescription given. VSS, NAD, no c/o pain, skin intact. Pt DC home- waiting on husband to tx home. Will continue to monitor until pt has left the unit,

## 2016-02-16 ENCOUNTER — Telehealth: Payer: Self-pay | Admitting: Hematology

## 2016-02-16 LAB — CULTURE, BLOOD (ROUTINE X 2)

## 2016-02-16 NOTE — Telephone Encounter (Signed)
sw pt to confirm r/s Dec 27 to Jan 3 per 12/19 LOS

## 2016-02-18 LAB — CULTURE, BLOOD (ROUTINE X 2): Culture: NO GROWTH

## 2016-02-23 ENCOUNTER — Ambulatory Visit: Payer: BLUE CROSS/BLUE SHIELD | Admitting: Nurse Practitioner

## 2016-02-23 ENCOUNTER — Other Ambulatory Visit: Payer: BLUE CROSS/BLUE SHIELD

## 2016-02-23 ENCOUNTER — Ambulatory Visit: Payer: BLUE CROSS/BLUE SHIELD

## 2016-02-24 ENCOUNTER — Other Ambulatory Visit: Payer: BLUE CROSS/BLUE SHIELD

## 2016-02-24 ENCOUNTER — Ambulatory Visit: Payer: BLUE CROSS/BLUE SHIELD

## 2016-02-25 NOTE — Progress Notes (Signed)
[facility]  [phone] [phone]  Clinic Follow up Note   Patient Care Team: Pcp Not In System as PCP - General 03/01/2016   CHIEF COMPLAINTS:  Follow up left breast cancer  Oncology History   Breast cancer of upper-outer quadrant of left female breast [facility])   Staging form: Breast, AJCC 7th Edition   - Pathologic: Stage IIA (T2, N0, cM0) - Unsigned      Breast cancer of upper-outer quadrant of left female breast ([place])   10/20/2015 Mammogram    Screening mammogram showed new nodular density on the left breast, measuring about 2 cm. the well defined nodules bilaterally are otherwise stable.      10/27/2015 Initial Biopsy    Left breast 2:00 position mass biopsy showed infiltrating ductal carcinoma.       10/27/2015 Initial Diagnosis    Breast cancer of upper-outer quadrant of left female breast ([place])      10/27/2015 Receptors her2    ER 100% positive, strong staining, PR 90% positive, strong staining, HER-2 IHC 2+, FISH negative.      11/16/2015 Surgery    Left breast lumpectomy and sentinel lymph node biopsy.      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 Margins were negative. One sentinel lymph node was negative. Lymphovascular invasion was negative.      11/16/2015 Oncotype testing    RS 23, intermediate risk, which predicts 10 year risk of distant recurrence 15% with tamoxifen      12/23/2015 -  Adjuvant Chemotherapy    Docetaxel and Cytoxan, every 3 weeks, with Neulasta on day 2, for 4 cycles       HISTORY OF PRESENTING ILLNESS:  Nicole Schmitt 55 y.o. female is here because of Her recently resected left breast cancer. She presents to the clinic by herself. She was referred by her surgeon Dr. Excell Seltzer.  She lives just outside of [place] but works as an Therapist, sports on the Advertising account planner at [employer]. Her breast cancer was discovered by  screening mammogram, and her initial workup was done in [place].She recently underwent a screening mamogram which revealed a possible mass in the left breast. Subsequent imaging included diagnostic mamogram showing a persistent area of asymmetric density in the left breast and ultrasound showing a 1.8 x 0.7 cm lobulated mass at the 2 o'clock position in the left breast 6 cm from the nipple. An ultrasound guided breast biopsy was performed on October 27, 2015 with pathology revealing invasive ductal carcinoma of the breast. She was referred to (surgeon Dr. Excell Seltzer, and underwent left breast lumpectomy and sentinel lymph node biopsy on 11/16/2015. The surgical pathology showed invasive grade 3 ductal carcinoma, 2.2 cm, one lymph node was negative.   She has been recovering well from her surgery. The pain to this surgical incision is minimal, she does not take much pain medication. No limited range of movement of left shoulder. Her appetite and energy level has been back to normal, she denies any other symptoms.  Her husband was diagnosed with metastatic melanoma in early 2016, has been receiving immunotherapy at [facility]. Her father was also recently diagnosed with CLL, but does not quite treatment. She has been under lot of stress in the past few years.  CURRENT THERAPY: Docetaxel and Cytoxan every 3 weeks, with Neulasta on day 2, started on 12/23/2015.  INTERIM HISTORY: Nicole Schmitt returns for follow-up and cycle 4 chemo.  She is accompanied by her husband, Richard. Since our last visit, she was hospitalized 12/16 to 02/15/2016 with neutropenic fever. She did okay after she left the hospital. She has felt better yesterday and today. She denies any more fever. She denies sore throat.  She did cough the other night but attributes it to choking on some juice. She reports some congestion which resolves on its own. She previously met with radiation oncologist Dr. Tammi Klippel. Her start date back to work is scheduled  for February 1st.   MEDICAL HISTORY:  Past Medical History:  Diagnosis Date  . Breast cancer ([place])   . Cancer ([place]) 1990   cervical   . Hypertension   . Seizures ([place])    > 20 years following fall from horse    SURGICAL HISTORY: Past Surgical History:  Procedure Laterality Date  . BREAST LUMPECTOMY WITH RADIOACTIVE SEED AND SENTINEL LYMPH NODE BIOPSY Left 11/16/2015   Procedure: BREAST LUMPECTOMY WITH RADIOACTIVE SEED AND SENTINEL LYMPH NODE BIOPSY;  Surgeon: Excell Seltzer, MD;  Location: [facility];  Service: General;  Laterality: Left;  . CESAREAN SECTION    . COLONOSCOPY    . WISDOM TOOTH EXTRACTION      SOCIAL HISTORY: Social History   Social History  . Marital status: Married    Spouse name: N/A  . Number of children: N/A  . Years of education: N/A   Occupational History  . Not on file.   Social History Main Topics  . Smoking status: Never Smoker  . Smokeless tobacco: Never Used  . Alcohol use Yes     Comment: rare  . Drug use: No  . Sexual activity: Yes   Other Topics Concern  . Not on file   Social History Narrative  . No narrative on file    FAMILY HISTORY: Family History  Problem Relation Age of Onset  . Cancer Maternal Uncle     liver cancer/environmental exposure  . Cancer Paternal Uncle     liver cancer/environmental exposure  . Epilepsy Mother   . Intracerebral hemorrhage Mother   . COPD Father   . Breast cancer Neg Hx     ALLERGIES:  has No Known Allergies.  MEDICATIONS:  Current Outpatient Prescriptions  Medication Sig Dispense Refill  . CLINDAGEL 1 % gel Apply 1 application topically daily.     . hydrochlorothiazide (HYDRODIURIL) 25 MG tablet Take 25 mg by mouth daily. 12.5 mg to 25 mg dependent on bp medication    . HYDROcodone-acetaminophen (NORCO/VICODIN) 5-325 MG tablet Take 1-2 tablets by mouth every 6 (six) hours as needed for moderate pain or severe pain. 10 tablet 0  . magic mouthwash w/lidocaine SOLN  Take 5 mLs by mouth 4 (four) times daily as needed for mouth pain. Swish and  Swallow  Or   Swish and  Spit. 240 mL 0  . ondansetron (ZOFRAN) 8 MG tablet Take 1 tablet (8 mg total) by mouth 2 (two) times daily as needed for refractory nausea / vomiting. Start on day 3 after chemo. 30 tablet 1  . prochlorperazine (COMPAZINE) 10 MG tablet Take 1 tablet (10 mg total) by mouth every 6 (six) hours as needed (Nausea or vomiting). 30 tablet 1  . valsartan (DIOVAN) 160 MG tablet Take 1 tablet (160 mg total) by mouth daily. 30 tablet 0  . dexamethasone (DECADRON) 4 MG tablet Take 2 tablets (8 mg total) by mouth 2 (two) times daily. Start the day before Taxotere. Then again the day after  chemo for 3 days. 30 tablet 1  . feeding supplement, ENSURE ENLIVE, (ENSURE ENLIVE) LIQD Take 237 mLs by mouth 2 (two) times daily between meals. 237 mL 12  . zolpidem (AMBIEN) 5 MG tablet Take 5 mg by mouth at bedtime as needed. for sleep  0   No current facility-administered medications for this visit.     REVIEW OF SYSTEMS:   Constitutional: Denies fevers, chills or abnormal night sweats Eyes: Denies blurriness of vision, double vision or watery eyes Ears, nose, mouth, throat, and face: Denies mucositis or sore throat Respiratory: Denies cough, dyspnea or wheezes Cardiovascular: Denies palpitation, chest discomfort or lower extremity swelling Gastrointestinal:  Denies nausea, heartburn or change in bowel habits Skin: Denies abnormal skin rashes Lymphatics: Denies new lymphadenopathy or easy bruising Neurological:Denies numbness, tingling or new weaknesses Behavioral/Psych: Mood is stable, no new changes  All other systems were reviewed with the patient and are negative.  PHYSICAL EXAMINATION: ECOG PERFORMANCE STATUS: 1 - Symptomatic but completely ambulatory  Vitals:   03/01/16 1232  BP: (!) 143/75  Pulse: 91  Resp: 18  Temp: 98.5 F (36.9 C)   Filed Weights   03/01/16 1232  Weight: 270 lb 6.4 oz (122.7  kg)    GENERAL:alert, no distress and comfortable SKIN: skin color, texture, turgor are normal, no rashes or significant lesions, (+) alopecia EYES: normal, conjunctiva are pink and non-injected, sclera clear OROPHARYNX:no exudate, no erythema and lips, buccal mucosa, and tongue normal  NECK: supple, thyroid normal size, non-tender, without nodularity LYMPH:  no palpable lymphadenopathy in the cervical, axillary or inguinal LUNGS: clear to auscultation and percussion with normal breathing effort HEART: regular rate & rhythm and no murmurs and no lower extremity edema ABDOMEN:abdomen soft, non-tender and normal bowel sounds Musculoskeletal:no cyanosis of digits and no clubbing  PSYCH: alert & oriented x 3 with fluent speech NEURO: no focal motor/sensory deficits   LABORATORY DATA:  I have reviewed the data as listed CBC Latest Ref Rng & Units 03/01/2016 02/15/2016 02/14/2016  WBC 3.9 - 10.3 10e3/uL 14.8(H) 10.2 3.1(L)  Hemoglobin 11.6 - 15.9 g/dL 11.6 9.9(L) 9.9(L)  Hematocrit 34.8 - 46.6 % 36.0 29.3(L) 29.3(L)  Platelets 145 - 400 10e3/uL 439(H) 234 246     CMP Latest Ref Rng & Units 03/01/2016 02/13/2016 02/12/2016  Glucose 70 - 140 mg/dl 193(H) 129(H) 121(H)  BUN 7.0 - 26.0 mg/dL 13._0 Creatinine 0.6 - 1.1 mg/dL 0.8 0.73 0.86  Sodium 136 - 145 mEq/L 141 133(L) 134(L)  Potassium 3.5 - 5.1 mEq/L 4.5 3.3(L) 3.5  Chloride 101 - 111 mmol/L - 104 101  CO2 22 - 29 mEq/L 19(L) 22 24  Calcium 8.4 - 10.4 mg/dL 9.7 8.1(L) 8.3(L)  Total Protein 6.4 - 8.3 g/dL 7.1 5.7(L) 6.3(L)  Total Bilirubin 0.20 - 1.20 mg/dL 0.34 0.9 0.8  Alkaline Phos 40 - 150 U/L 96 68 81  AST 5 - 34 U/L _1 ALT 0 - 55 U/L _2 PATHOLOGY REPORT Diagnosis 11/16/2015    1. Breast, lumpectomy, Left w/seed - INVASIVE GRADE 3 DUCTAL CARCINOMA, SPANNING 2.2 CM IN GREATEST DIMENSION. 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 - ASSOCIATED PROMINENT LYMPHOID RESPONSE. - INVASIVE DUCTAL  CARCINOMA IS FOCALLY 0.1 TO 0.2 CM AWAY FROM MEDIAL MARGIN BUT MARGINAL SURFACE IS NEGATIVE FOR INVASIVE TUMOR. - DUCTAL CARCINOMA IN SITU IS FOCALLY 0.1 CM AWAY FROM LATERAL MARGIN - OTHER MARGINS ARE NEGATIVE. - SEE ONCOLOGY TEMPLATE. 2.  Lymph node, sentinel, biopsy, Left Axillary - ONE BENIGN LYMPH NODE WITH NO TUMOR SEEN (0/1). Microscopic Comment 1. BREAST, INVASIVE TUMOR, WITH LYMPH NODES PRESENT Specimen, including laterality and lymph node sampling (sentinel, non-sentinel): Left partial breast with left axillary sentinel lymph node. Procedure: Left breast lumpectomy with left axillary sentinel lymph node biopsy. Histologic type: Invasive ductal carcinoma. Grade: 3. Tubule formation: 3. Nuclear pleomorphism: 2. Mitotic: 3. Tumor size (gross measurement): 2.2 cm. Margins: Invasive, distance to closest margin: Invasive ductal carcinoma is focally 0.1 to 0.2 cm away from medial margin. In-situ, distance to closest margin: Ductal carcinoma in situ is focally 0.1 cm from lateral margin. If margin positive, focally or broadly: Marginal surface is not positive for tumor. Lymphovascular invasion: Definitive lymph/vascular invasion is not identified. Ductal carcinoma in situ: Yes. Grade: Intermediate grade. Extensive intraductal component: No. Lobular neoplasia: Not identified. Tumor focality: Unifocal. Extent of tumor: Tumor confined to breast parenchyma. Skin: Not received. Nipple: Not received. Skeletal muscle: Not received. Lymph nodes: Examined: 1 Sentinel. 0 Non-sentinel. 1 Total. Lymph nodes with metastasis: 0. Isolated tumor cells (< 0.2 mm): 0. Micrometastasis: (> 0.2 mm and < 2.0 mm): 0. Macrometastasis: (> 2.0 mm): 0. Extracapsular extension: Not applicable. Breast prognostic profile: Assessed on previous biopsy ([id number]): Estrogen receptor: 100%, positive. Progesterone receptor: 90%, positive. Her 2 neu: 2+ equivocal staining on immunohistochemical stain.  FISH is pending on biopsy, per previous biospy report. Ki-67: 30%. Non-neoplastic breast: Fibrocystic changes. TNM: pT2, pN0. (RH:kh 11-18-15)  Diagnosis 10/27/2015 Consult Slide , Left Breast Biopsy - INVASIVE DUCTAL CARCINOMA, SEE COMMENT. - DUCTAL CARCINOMA IN SITU. Microscopic Comment While grading is best performed on the resection specimen the carcinoma appears grade 3. E-cadherin is positive confirming a ductal phenotype. Beta-catenin is positive. p53 exhibits weak staining. ER: Positive, 100%, strong staining intensity. PR: Positive, 90%, strong staining intensity. Ki-67: 30% Her2 IHC: 2+ Equivocal, FISH (-), with average copy number of 2, and a ratio of 1.07.  Oncotype RS 23, intermediate risk, which predicts 10 year risk of distant recurrence 15% with tamoxifen  RADIOGRAPHIC STUDIES: I have personally reviewed the radiological images as listed and agreed with the findings in the report. Dg Chest 2 View  Result Date: 02/12/2016 CLINICAL DATA:  Initial evaluation for acute fever. Shortness of breath. EXAM: CHEST  2 VIEW COMPARISON:  None available. FINDINGS: Cardiac and mediastinal silhouettes are within normal limits. Lungs are normally inflated. There is patchy opacity within the retrocardiac left lower lobe, suspicious for possible small infiltrate given the provided history. Superimposed mild peribronchial thickening present. No other focal airspace disease. No pulmonary edema or pleural effusion. No pneumothorax. No acute osseous abnormality. IMPRESSION: 1. Mild patchy retrocardiac left lower lobe opacity, suspicious for small focal infiltrate given the provided history. 2. Superimposed mild diffuse peribronchial thickening. Electronically Signed   By: Jeannine Boga M.D.   On: 02/12/2016 18:49    ASSESSMENT & PLAN: 55 y.o. Caucasian post menopause female, presented with screening discovered left breast cancer  1. 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 -We previously discussed her Oncotype DX test results. The recurrence score is 23, which is intermedia risk, it protects 10 year of distant recurrence risk of 15% with tamoxifen. The benefit of adjuvant chemotherapy intermediate risk group is uncertain, however given her young  age, grade 3 disease, I think adjuvant chemo will probably reduce her risk of recurrence although the benefit is not very high. She is very concerned about cancer recurrence. After lengthy discussion, we decided to proceed with adjuvant chemotherapy -Given her node negative disease, I recommend docetaxel and Cytoxan every 3 weeks for total 4 cycles. -The goal of therapy is curative. -She developed neutropenic fever after cycle 3 chemotherapy without Neulasta -Lab reviewed, adequate for treatment, we'll proceed to cycle 4 TC today with Neulasta.  -The patient is agreeable to Neulasta today.  --Given the strong ER and PR positivity, I do recommend adjuvant aromatase inhibitor to reduce her risk of cancer recurrence,  The potential benefit and side effects, which includes but not limited to, hot flash, skin and vaginal dryness, metabolic changes ( increased blood glucose, cholesterol, weight, etc.), slightly in increased risk of cardiovascular disease, cataracts, muscular and joint discomfort, osteopenia and osteoporosis, etc, were discussed with her in great details. She is interested, and we'll start after she completes radiation. -She will follow up with radiation oncology and begin radiation treatment soon   2. Mucositis, secondary to chemotherapy -Resolved. She knows to use Magic mouth wash as needed   3. Recent neutropenic fever and pneumonia  -She developed neutropenic fever and pneumonia after  third cycle chemotherapy, and was hospitalized -resolved now   4. HTN -She is on antihypertensive medication, including HCTZ -We discussed the impact of chemotherapy on her blood pressure. We'll monitor her blood pressure closely during the chemotherapy. We may need to hold her HCTZ if her BP drops after chemo due to decreased po intake.   5. Bone pain  -Secondary to Neulasta  -I reviewed her hydrocodone  As needed  PLAN -Lab reviewed, we'll proceed to cycle 4 TC today (last cycle) -I have refilled vicodin and losartan today -She will follow up with radiation oncology and begin radiation treatment, I sent a message to Dr. Tammi Klippel  -I will see her again in 4 weeks for follow up.   All questions were answered. The patient knows to call the clinic with any problems, questions or concerns.  I spent 20 minutes counseling the patient face to face. The total time spent in the appointment was 25 minutes and more than 50% was on counseling.  This document serves as a record of services personally performed by [name], MD. It was created on her behalf by Arlyce Harman, a trained medical scribe. The creation of this record is based on the scribe's personal observations and the provider's statements to them. This document has been checked and approved by the attending provider.     [name], MD 03/01/2016

## 2016-02-29 ENCOUNTER — Ambulatory Visit: Payer: BLUE CROSS/BLUE SHIELD | Admitting: Hematology

## 2016-03-01 ENCOUNTER — Ambulatory Visit (HOSPITAL_BASED_OUTPATIENT_CLINIC_OR_DEPARTMENT_OTHER): Payer: BLUE CROSS/BLUE SHIELD

## 2016-03-01 ENCOUNTER — Ambulatory Visit: Payer: BLUE CROSS/BLUE SHIELD | Admitting: Nurse Practitioner

## 2016-03-01 ENCOUNTER — Ambulatory Visit (HOSPITAL_BASED_OUTPATIENT_CLINIC_OR_DEPARTMENT_OTHER): Payer: BLUE CROSS/BLUE SHIELD | Admitting: Hematology

## 2016-03-01 ENCOUNTER — Other Ambulatory Visit (HOSPITAL_BASED_OUTPATIENT_CLINIC_OR_DEPARTMENT_OTHER): Payer: BLUE CROSS/BLUE SHIELD

## 2016-03-01 ENCOUNTER — Encounter: Payer: Self-pay | Admitting: Hematology

## 2016-03-01 ENCOUNTER — Encounter: Payer: Self-pay | Admitting: *Deleted

## 2016-03-01 VITALS — BP 143/75 | HR 91 | Temp 98.5°F | Resp 18 | Ht 65.0 in | Wt 270.4 lb

## 2016-03-01 DIAGNOSIS — C50412 Malignant neoplasm of upper-outer quadrant of left female breast: Secondary | ICD-10-CM

## 2016-03-01 DIAGNOSIS — Z170 Estrogen receptor positive status [ER+]: Principal | ICD-10-CM

## 2016-03-01 DIAGNOSIS — Z5189 Encounter for other specified aftercare: Secondary | ICD-10-CM | POA: Diagnosis not present

## 2016-03-01 DIAGNOSIS — Z5111 Encounter for antineoplastic chemotherapy: Secondary | ICD-10-CM

## 2016-03-01 DIAGNOSIS — K1231 Oral mucositis (ulcerative) due to antineoplastic therapy: Secondary | ICD-10-CM

## 2016-03-01 DIAGNOSIS — I10 Essential (primary) hypertension: Secondary | ICD-10-CM | POA: Diagnosis not present

## 2016-03-01 LAB — COMPREHENSIVE METABOLIC PANEL
ALBUMIN: 3.5 g/dL (ref 3.5–5.0)
ALK PHOS: 96 U/L (ref 40–150)
ALT: 13 U/L (ref 0–55)
AST: 10 U/L (ref 5–34)
Anion Gap: 13 mEq/L — ABNORMAL HIGH (ref 3–11)
BILIRUBIN TOTAL: 0.34 mg/dL (ref 0.20–1.20)
BUN: 13.5 mg/dL (ref 7.0–26.0)
CALCIUM: 9.7 mg/dL (ref 8.4–10.4)
CO2: 19 mEq/L — ABNORMAL LOW (ref 22–29)
Chloride: 109 mEq/L (ref 98–109)
Creatinine: 0.8 mg/dL (ref 0.6–1.1)
EGFR: 89 mL/min/{1.73_m2} — AB (ref >90)
Glucose: 193 mg/dl — ABNORMAL HIGH (ref 70–140)
Potassium: 4.5 mEq/L (ref 3.5–5.1)
SODIUM: 141 meq/L (ref 136–145)
TOTAL PROTEIN: 7.1 g/dL (ref 6.4–8.3)

## 2016-03-01 LAB — CBC WITH DIFFERENTIAL/PLATELET
BASO%: 0.1 % (ref 0.0–2.0)
Basophils Absolute: 0 10*3/uL (ref 0.0–0.1)
EOS ABS: 0 10*3/uL (ref 0.0–0.5)
EOS%: 0 % (ref 0.0–7.0)
HEMATOCRIT: 36 % (ref 34.8–46.6)
HEMOGLOBIN: 11.6 g/dL (ref 11.6–15.9)
LYMPH#: 1 10*3/uL (ref 0.9–3.3)
LYMPH%: 6.8 % — ABNORMAL LOW (ref 14.0–49.7)
MCH: 28.4 pg (ref 25.1–34.0)
MCHC: 32.2 g/dL (ref 31.5–36.0)
MCV: 88.2 fL (ref 79.5–101.0)
MONO#: 0.4 10*3/uL (ref 0.1–0.9)
MONO%: 2.6 % (ref 0.0–14.0)
NEUT%: 90.5 % — ABNORMAL HIGH (ref 38.4–76.8)
NEUTROS ABS: 13.4 10*3/uL — AB (ref 1.5–6.5)
PLATELETS: 439 10*3/uL — AB (ref 145–400)
RBC: 4.08 10*6/uL (ref 3.70–5.45)
RDW: 17.7 % — AB (ref 11.2–14.5)
WBC: 14.8 10*3/uL — ABNORMAL HIGH (ref 3.9–10.3)

## 2016-03-01 MED ORDER — VALSARTAN 160 MG PO TABS: 160.0000 mg | ORAL_TABLET | Freq: Every day | ORAL | 0 refills | Status: DC

## 2016-03-01 MED ORDER — DEXAMETHASONE SODIUM PHOSPHATE 10 MG/ML IJ SOLN
10.0000 mg | Freq: Once | INTRAMUSCULAR | Status: AC
  Administered 2016-03-01: 10 mg via INTRAVENOUS

## 2016-03-01 MED ORDER — DEXTROSE 5 % IV SOLN
75.0000 mg/m2 | Freq: Once | INTRAVENOUS | Status: AC
  Administered 2016-03-01: 180 mg via INTRAVENOUS
  Filled 2016-03-01: qty 18

## 2016-03-01 MED ORDER — PALONOSETRON HCL INJECTION 0.25 MG/5ML
INTRAVENOUS | Status: AC
  Filled 2016-03-01: qty 5

## 2016-03-01 MED ORDER — HYDROCODONE-ACETAMINOPHEN 5-325 MG PO TABS: 1.0000 | ORAL_TABLET | Freq: Four times a day (QID) | ORAL | 0 refills | Status: DC | PRN

## 2016-03-01 MED ORDER — PALONOSETRON HCL INJECTION 0.25 MG/5ML
0.2500 mg | Freq: Once | INTRAVENOUS | Status: AC
  Administered 2016-03-01: 0.25 mg via INTRAVENOUS

## 2016-03-01 MED ORDER — DEXAMETHASONE SODIUM PHOSPHATE 10 MG/ML IJ SOLN
INTRAMUSCULAR | Status: AC
  Filled 2016-03-01: qty 1

## 2016-03-01 MED ORDER — PEGFILGRASTIM 6 MG/0.6ML ~~LOC~~ PSKT
6.0000 mg | PREFILLED_SYRINGE | Freq: Once | SUBCUTANEOUS | Status: AC
  Administered 2016-03-01: 6 mg via SUBCUTANEOUS
  Filled 2016-03-01: qty 0.6

## 2016-03-01 MED ORDER — SODIUM CHLORIDE 0.9 % IV SOLN
600.0000 mg/m2 | Freq: Once | INTRAVENOUS | Status: AC
  Administered 2016-03-01: 1420 mg via INTRAVENOUS
  Filled 2016-03-01: qty 71

## 2016-03-01 MED ORDER — SODIUM CHLORIDE 0.9 % IV SOLN
Freq: Once | INTRAVENOUS | Status: AC
  Administered 2016-03-01: 15:00:00 via INTRAVENOUS

## 2016-03-01 NOTE — Patient Instructions (Signed)
[facility] Discharge Instructions for Patients Receiving Chemotherapy  Today you received the following chemotherapy agents:  Taxotere, Cytoxan  To help prevent nausea and vomiting after your treatment, we encourage you to take your nausea medication as prescribed.   If you develop nausea and vomiting that is not controlled by your nausea medication, call the clinic.   BELOW ARE SYMPTOMS THAT SHOULD BE REPORTED IMMEDIATELY:  *FEVER GREATER THAN 100.5 F  *CHILLS WITH OR WITHOUT FEVER  NAUSEA AND VOMITING THAT IS NOT CONTROLLED WITH YOUR NAUSEA MEDICATION  *UNUSUAL SHORTNESS OF BREATH  *UNUSUAL BRUISING OR BLEEDING  TENDERNESS IN MOUTH AND THROAT WITH OR WITHOUT PRESENCE OF ULCERS  *URINARY PROBLEMS  *BOWEL PROBLEMS  UNUSUAL RASH Items with * indicate a potential emergency and should be followed up as soon as possible.  Feel free to call the clinic you have any questions or concerns. The clinic phone number is [phone].  Please show the CHEMO ALERT CARD at check-in to the Emergency Department and triage nurse.   

## 2016-03-02 NOTE — Addendum Note (Signed)
Addended by: [name] on: 03/02/2016 10:15 AM   Modules accepted: Orders

## 2016-03-15 NOTE — Progress Notes (Signed)
Location of Breast Cancer: Stage IIA left breast cancer of upper outer quadrant  Histology per Pathology Report:    Receptor Status: ER(100%), PR (90%), Her2-neu (2+ equivocal staining on immunohistochemical stain. FISH pending), Ki-(30%)  Did patient present with symptoms (if so, please note symptoms) or was this found on screening mammography?: screening mammogram  Past/Anticipated interventions by surgeon, if JPV:GKKD breast lumpectomy with radioactive seed and sentinel lymph node biopsy  Past/Anticipated interventions by medical oncology, if any: consulted by Dr. Burr Medico 11/24/15. Oncotype testing: RS 23, intermediate risk. Docetaxel and Cytoxan, every three weeks, with Neulasta for four cycles. Scheduled to follow up with [facility] 03/29/16.    Lymphedema issues, if any:  no  Pain issues, if any:  Left axillary tenderness resolved.  SAFETY ISSUES:  Prior radiation? no  Pacemaker/ICD? no  Possible current pregnancy?no  Is the patient on methotrexate? no  Current Complaints / other details:  56 year old female. Married with a 69 year old. ICU nurse at [employer]. Lives in [place]. Husband has metastatic melanoma-receiving tx at [facility]. Father has cancer. No family hx of breast cancer. Left breast incision healing well without redness, edema or drainage. Scant bloody drainage from left axilla/lymph node incision. Patient was written out of work by [employer] until 03/30/2016.   Age at menarche:         23 years Age of menopause:       [age] Contraceptive History:             Yes Gravida 1 Para 1

## 2016-03-16 ENCOUNTER — Ambulatory Visit
Admission: RE | Admit: 2016-03-16 | Discharge: 2016-03-16 | Disposition: A | Discharge status: Home / Self Care | Payer: BLUE CROSS/BLUE SHIELD | Source: Ambulatory Visit | Attending: Radiation Oncology | Admitting: Radiation Oncology

## 2016-03-16 ENCOUNTER — Encounter: Payer: Self-pay | Admitting: Radiation Oncology

## 2016-03-16 VITALS — BP 128/72 | HR 77 | Resp 18 | Wt 267.0 lb

## 2016-03-16 DIAGNOSIS — Z170 Estrogen receptor positive status [ER+]: Secondary | ICD-10-CM | POA: Diagnosis not present

## 2016-03-16 DIAGNOSIS — C50412 Malignant neoplasm of upper-outer quadrant of left female breast: Secondary | ICD-10-CM | POA: Diagnosis present

## 2016-03-16 DIAGNOSIS — Z510 Encounter for antineoplastic radiation therapy: Secondary | ICD-10-CM | POA: Insufficient documentation

## 2016-03-16 DIAGNOSIS — I10 Essential (primary) hypertension: Secondary | ICD-10-CM | POA: Diagnosis not present

## 2016-03-16 DIAGNOSIS — Z800 Family history of malignant neoplasm of digestive organs: Secondary | ICD-10-CM | POA: Diagnosis not present

## 2016-03-16 DIAGNOSIS — R569 Unspecified convulsions: Secondary | ICD-10-CM | POA: Diagnosis not present

## 2016-03-16 MED ORDER — RADIAPLEXRX EX GEL
Freq: Once | CUTANEOUS | Status: AC
  Administered 2016-03-16: 14:00:00 via TOPICAL

## 2016-03-16 MED ORDER — ALRA NON-METALLIC DEODORANT (RAD-ONC)
1.0000 "application " | Freq: Once | TOPICAL | Status: AC
  Administered 2016-03-16: 1 via TOPICAL

## 2016-03-16 NOTE — Progress Notes (Signed)
Provided patient with radiaplex and alra then, directed upon use. Patient verbalized understanding.

## 2016-03-16 NOTE — Progress Notes (Addendum)
  Radiation Oncology         [phone] ________________________________  Name: Nicole Schmitt MRN: [id number]  Date: 03/16/2016  DOB: [birth year]  SIMULATION AND TREATMENT PLANNING NOTE    ICD-9-CM ICD-10-CM   1. Malignant neoplasm of upper-outer quadrant of left breast in female, estrogen receptor positive ([place]) 174.4 C50.412    V86.0 Z17.0     DIAGNOSIS:  55 yo woman with grade III stage T2 N0 M0 ER+ Invasive Ductal Carcinoma of the upper outer left breast - Stage IIA  NARRATIVE:  The patient was brought to the [place] suite.  Identity was confirmed.  All relevant records and images related to the planned course of therapy were reviewed.  The patient freely provided informed written consent to proceed with treatment after reviewing the details related to the planned course of therapy. The consent form was witnessed and verified by the simulation staff.  Then, the patient was set-up in a stable reproducible  supine and arms up on breast board position for radiation therapy.  CT images were obtained.  Surface markings were placed.  The CT images were loaded into the planning software.  Then the target and avoidance structures were contoured.  Treatment planning then occurred.  The radiation prescription was entered and confirmed.  Then, I designed and supervised the construction of a total of 3 medically necessary complex treatment devices with neck pillow and MLCs for tangents shielding heart and lungs.  I have requested : 3D Simulation  I have requested a DVH of the following structures: heart, left lung, right lung, lumpectomy site and breast tissue.  Special treatment procedure was performed today due to the extra time and effort required by myself to plan and prepare this patient for deep inspiration breath hold technique.  I have determined cardiac sparing to be of benefit to this patient to prevent long term cardiac damage due to radiation of the heart.  Bellows were placed  on the patient's abdomen. To facilitate cardiac sparing, the patient was coached by the radiation therapists on breath hold techniques and breathing practice was performed. Practice waveforms were obtained. The patient was then scanned while maintaining breath hold in the treatment position.  This image was then transferred over to the imaging specialist. The imaging specialist then created a fusion of the free breathing and breath hold scans using the chest wall as the stable structure. I personally reviewed the fusion in axial, coronal and sagittal image planes.  Excellent cardiac sparing was obtained.  I felt the patient is an appropriate candidate for breath hold and the patient will be treated as such.  The image fusion was then reviewed with the patient to reinforce the necessity of reproducible breath hold.  Hypofractionation is not an ideal option in this case due to grade III disease and recent chemotherapy.  PLAN:  The patient will receive 50.4 Gy in 28 fractions followed by boost.  ________________________________  Sheral Apley. Tammi Klippel, M.D.  This document serves as a record of services personally performed by Tyler Pita, MD. It was created on his behalf by Bethann Humble, a trained medical scribe. The creation of this record is based on the scribe's personal observations and the provider's statements to them. This document has been checked and approved by the attending provider.

## 2016-03-16 NOTE — Progress Notes (Signed)
See progress note under physician encounter. 

## 2016-03-16 NOTE — Progress Notes (Signed)
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 Malignant neoplasm of upper-outer quadrant of left breast in female, estrogen receptor positive (HCC) 174.4 C50.412 hyaluronate sodium (RADIAPLEXRX) gel   C58.5 I77.8 non-metallic deodorant (ALRA) 1 application    HISTORY OF PRESENT ILLNESS:Nicole Schmitt is a 56 y.o. female seen in follow up for a history of left breast cancer . Her disease was found by screening mammogram. Diagnostic imaging revealed a mass in the left breast measuring 2cm. A biopsy on 10/27/15 revealed a ER/PR positive, HER2 2+ invasive ductal carcinoma of the left breast.  She underwent a left breast lumpectomy with sentinel node mapping. Her disease was categorized as invasive grade 3 ductal carcinoma, spanning 2.2 cm in the greatest dimension, associated with intermediate grade ductal carcinoma in situ. Invasive ductal carcinoma is focally 0.1 to 0.2 cm away from the medial margin but marginal surface is negative for invasive tumor. Ductal carcinoma in situ is focally 0.1 cm away from lateral margin. Other margins are negative, and lymph nodes were negative. She had oncotype testing which revealed a intermediate risk of recurrence with a score of 23. She completed 4 cycles of taxotere/cytoxan on 03/01/16, and comes today to discuss proceeding with adjuvant radiotherapy.  Patient presents with left breast incision healing well without redness, edema, or drainage. Scant bloody drainage from left axilla/lymph node incision.   PREVIOUS RADIATION THERAPY: No Past Medical History:  Past Medical History:  Diagnosis Date  . Breast cancer ([place])   . Cancer ([place]) 1990   cervical   . Hypertension   . Seizures ([place])    > 20 years following fall from horse    Past Surgical History: Past Surgical History:  Procedure Laterality Date  . BREAST LUMPECTOMY WITH RADIOACTIVE SEED AND SENTINEL LYMPH NODE BIOPSY Left 11/16/2015   Procedure: BREAST LUMPECTOMY WITH RADIOACTIVE SEED AND SENTINEL LYMPH NODE BIOPSY;  Surgeon: Excell Seltzer, MD;  Location: [facility];  Service: General;  Laterality: Left;  . CESAREAN SECTION    . COLONOSCOPY    . WISDOM TOOTH EXTRACTION      Social History:  Social History   Social History  . Marital status: Married    Spouse name: N/A  . Number of children: N/A  . Years of education: N/A   Occupational History  . Not on file.   Social History Main Topics  . Smoking status: Never Smoker  . Smokeless tobacco: Never Used  . Alcohol use Yes     Comment: rare  . Drug use: No  . Sexual activity: Yes   Other Topics Concern  . Not on file   Social History Narrative  . No narrative on file    Family History: Family History  Problem Relation Age of Onset  . Cancer Maternal Uncle     liver cancer/environmental exposure  . Cancer Paternal Uncle     liver cancer/environmental exposure  . Epilepsy Mother   . Intracerebral hemorrhage Mother   . COPD Father   . Breast cancer Neg Hx    MEDICATIONS: Current Outpatient Prescriptions:  .  non-metallic deodorant (ALRA) MISC, Apply 1 application topically daily as needed., Disp: ,  Rfl:  .  valsartan (DIOVAN) 160 MG tablet, Take 1 tablet (160 mg total) by mouth daily., Disp: 30 tablet, Rfl: 0 .  Wound Cleansers (RADIAPLEX EX), Apply topically., Disp: , Rfl:  .  CLINDAGEL 1 % gel, Apply 1 application topically daily. , Disp: , Rfl:  .  hydrochlorothiazide (HYDRODIURIL) 25 MG tablet, Take 25 mg by mouth daily. 12.5 mg to 25 mg dependent on bp medication, Disp: , Rfl:   ALLERGIES: No Known Allergies   REVIEW OF SYSTEMS: On review of systems, the patient  reports that she is doing well overall. She denies any chest pain, shortness of breath, cough, fevers, chills, night sweats, unintended weight changes. She denies any bowel or bladder disturbances, and denies abdominal pain, nausea or vomiting. She denies any new musculoskeletal or joint aches or pains, new skin lesions or concerns. A complete review of systems is obtained and is otherwise negative.  Patient denies lymphedema issues. She notes left axillary tenderness has since resolved.  PHYSICAL EXAM:  Blood pressure 128/72, pulse 77, resp. rate 18, weight 267 lb (121.1 kg), SpO2 100 %. In general this is a well appearing caucasian woman in no acute distress. She's alert and oriented x4 and appropriate throughout the examination. Cardiopulmonary assessment is negative for acute distress and she exhibits normal effort. The left breast is intact with a well healed lumpectomy scar. No cellulitic changes are noted.   KPS = 90  100 - Normal; no complaints; no evidence of disease. 90   - Able to carry on normal activity; minor signs or symptoms of disease. 80   - Normal activity with effort; some signs or symptoms of disease. 85   - Cares for self; unable to carry on normal activity or to do active work. 60   - Requires occasional assistance, but is able to care for most of his personal needs. 50   - Requires considerable assistance and frequent medical care. 73   - Disabled; requires special care and assistance. 22   - Severely disabled; hospital admission is indicated although death not imminent. 61   - Very sick; hospital admission necessary; active supportive treatment necessary. 10   - Moribund; fatal processes progressing rapidly. 0     - Dead  Karnofsky DA, Abelmann Haigler, Craver LS and Burchenal [facility] [phone]) The use of the nitrogen mustards in the palliative treatment of carcinoma: with particular reference to bronchogenic carcinoma Cancer 1 634-56  LABORATORY DATA:  Lab Results  Component  Value Date   WBC 14.8 (H) 03/01/2016   HGB 11.6 03/01/2016   HCT 36.0 03/01/2016   MCV 88.2 03/01/2016   PLT 439 (H) 03/01/2016   Lab Results  Component Value Date   NA 141 03/01/2016   K 4.5 03/01/2016   CL 104 02/13/2016   CO2 19 (L) 03/01/2016   Lab Results  Component Value Date   ALT 13 03/01/2016   AST 10 03/01/2016   ALKPHOS 96 03/01/2016   BILITOT 0.34 03/01/2016     RADIOGRAPHY: No results found.    IMPRESSION/PLAN: 1.   56 y.o.  woman with Stage IIA, T2 N0 M0 ER/PR positive, HER2 positve Invasive Ductal Carcinoma of the upper outer left breast. I met back with the patient to discuss the role of radiotherapy, now that she has completed surgical resection and adjuvant chemotherapy. We reviewed the role of radiotherapy in the management of invasive breast cancer in this setting. She was offered a course of 6 1/2 weeks of radiation  treatment. We discussed the risks, benefits, short, and long term effects of radiotherapy, and the patient is interested in proceeding. She will proceed with simulation this afternoon..  Today, I talked to the patient and family about the findings and work-up thus far.  We discussed the natural history of left sided breast cancer and general treatment, highlighting the role of radiotherapy in the management.  We discussed the available radiation techniques, and focused on the details of logistics and delivery.  We reviewed the anticipated acute and late sequelae associated with radiation in this setting.  The patient was encouraged to ask questions that I answered to the best of my ability. The patient would like to proceed with radiation and will be scheduled for CT simulation. A consent form was signed and a copy was placed in the patient's file.  I spent 60 minutes minutes face to face with the patient and more than 50% of that time was spent in counseling and/or coordination of care.     _____________________________________  Sheral Apley.  Tammi Klippel, M.D.  This document serves as a record of services personally performed by Shona Simpson, PA-C and Tyler Pita, MD. It was created on their behalf by Bethann Humble, a trained medical scribe. The creation of this record is based on the scribe's personal observations and the provider's statements to them. This document has been checked and approved by the attending provider.

## 2016-03-22 DIAGNOSIS — Z510 Encounter for antineoplastic radiation therapy: Secondary | ICD-10-CM | POA: Diagnosis not present

## 2016-03-24 ENCOUNTER — Ambulatory Visit
Admission: RE | Admit: 2016-03-24 | Discharge: 2016-03-24 | Disposition: A | Discharge status: Home / Self Care | Payer: BLUE CROSS/BLUE SHIELD | Source: Ambulatory Visit | Attending: Radiation Oncology | Admitting: Radiation Oncology

## 2016-03-24 DIAGNOSIS — Z510 Encounter for antineoplastic radiation therapy: Secondary | ICD-10-CM | POA: Diagnosis not present

## 2016-03-27 ENCOUNTER — Encounter: Payer: Self-pay | Admitting: Radiation Oncology

## 2016-03-27 ENCOUNTER — Ambulatory Visit
Admission: RE | Admit: 2016-03-27 | Discharge: 2016-03-27 | Disposition: A | Discharge status: Home / Self Care | Payer: BLUE CROSS/BLUE SHIELD | Source: Ambulatory Visit | Attending: Radiation Oncology | Admitting: Radiation Oncology

## 2016-03-27 DIAGNOSIS — Z510 Encounter for antineoplastic radiation therapy: Secondary | ICD-10-CM | POA: Diagnosis not present

## 2016-03-27 NOTE — Progress Notes (Signed)
Paperwork (matrix) received 1/29, given to nurse 1/30

## 2016-03-28 ENCOUNTER — Ambulatory Visit
Admission: RE | Admit: 2016-03-28 | Discharge: 2016-03-28 | Disposition: A | Discharge status: Home / Self Care | Payer: BLUE CROSS/BLUE SHIELD | Source: Ambulatory Visit | Attending: Radiation Oncology | Admitting: Radiation Oncology

## 2016-03-28 DIAGNOSIS — Z510 Encounter for antineoplastic radiation therapy: Secondary | ICD-10-CM | POA: Diagnosis not present

## 2016-03-29 ENCOUNTER — Telehealth: Payer: Self-pay | Admitting: Radiation Oncology

## 2016-03-29 ENCOUNTER — Ambulatory Visit
Admission: RE | Admit: 2016-03-29 | Discharge: 2016-03-29 | Disposition: A | Discharge status: Home / Self Care | Payer: BLUE CROSS/BLUE SHIELD | Source: Ambulatory Visit | Attending: Radiation Oncology | Admitting: Radiation Oncology

## 2016-03-29 ENCOUNTER — Telehealth: Payer: Self-pay | Admitting: Hematology

## 2016-03-29 ENCOUNTER — Other Ambulatory Visit (HOSPITAL_BASED_OUTPATIENT_CLINIC_OR_DEPARTMENT_OTHER): Payer: BLUE CROSS/BLUE SHIELD

## 2016-03-29 ENCOUNTER — Encounter: Payer: Self-pay | Admitting: Hematology

## 2016-03-29 ENCOUNTER — Ambulatory Visit (HOSPITAL_BASED_OUTPATIENT_CLINIC_OR_DEPARTMENT_OTHER): Payer: BLUE CROSS/BLUE SHIELD | Admitting: Hematology

## 2016-03-29 VITALS — BP 117/75 | HR 82 | Temp 97.8°F | Resp 18 | Ht 65.0 in | Wt 271.9 lb

## 2016-03-29 DIAGNOSIS — C50412 Malignant neoplasm of upper-outer quadrant of left female breast: Secondary | ICD-10-CM

## 2016-03-29 DIAGNOSIS — I10 Essential (primary) hypertension: Secondary | ICD-10-CM

## 2016-03-29 DIAGNOSIS — R5383 Other fatigue: Secondary | ICD-10-CM

## 2016-03-29 DIAGNOSIS — Z170 Estrogen receptor positive status [ER+]: Secondary | ICD-10-CM

## 2016-03-29 DIAGNOSIS — G629 Polyneuropathy, unspecified: Secondary | ICD-10-CM | POA: Diagnosis not present

## 2016-03-29 DIAGNOSIS — Z510 Encounter for antineoplastic radiation therapy: Secondary | ICD-10-CM | POA: Diagnosis not present

## 2016-03-29 LAB — COMPREHENSIVE METABOLIC PANEL
ALBUMIN: 3.3 g/dL — AB (ref 3.5–5.0)
ALK PHOS: 82 U/L (ref 40–150)
ALT: 22 U/L (ref 0–55)
ANION GAP: 11 meq/L (ref 3–11)
AST: 22 U/L (ref 5–34)
BILIRUBIN TOTAL: 0.59 mg/dL (ref 0.20–1.20)
BUN: 13.3 mg/dL (ref 7.0–26.0)
CO2: 23 meq/L (ref 22–29)
CREATININE: 0.7 mg/dL (ref 0.6–1.1)
Calcium: 9.4 mg/dL (ref 8.4–10.4)
Chloride: 107 mEq/L (ref 98–109)
EGFR: >90 mL/min/{1.73_m2} (ref >90)
Glucose: 103 mg/dl (ref 70–140)
Potassium: 3.8 mEq/L (ref 3.5–5.1)
Sodium: 141 mEq/L (ref 136–145)
TOTAL PROTEIN: 6.1 g/dL — AB (ref 6.4–8.3)

## 2016-03-29 LAB — CBC WITH DIFFERENTIAL/PLATELET
BASO%: 0.5 % (ref 0.0–2.0)
Basophils Absolute: 0 10*3/uL (ref 0.0–0.1)
EOS%: 3.5 % (ref 0.0–7.0)
Eosinophils Absolute: 0.3 10*3/uL (ref 0.0–0.5)
HCT: 36.5 % (ref 34.8–46.6)
HGB: 12 g/dL (ref 11.6–15.9)
LYMPH%: 21.6 % (ref 14.0–49.7)
MCH: 29.8 pg (ref 25.1–34.0)
MCHC: 33 g/dL (ref 31.5–36.0)
MCV: 90.3 fL (ref 79.5–101.0)
MONO#: 0.9 10*3/uL (ref 0.1–0.9)
MONO%: 11.4 % (ref 0.0–14.0)
NEUT#: 4.7 10*3/uL (ref 1.5–6.5)
NEUT%: 63 % (ref 38.4–76.8)
Platelets: 330 10*3/uL (ref 145–400)
RBC: 4.04 10*6/uL (ref 3.70–5.45)
RDW: 18 % — ABNORMAL HIGH (ref 11.2–14.5)
WBC: 7.5 10*3/uL (ref 3.9–10.3)
lymph#: 1.6 10*3/uL (ref 0.9–3.3)

## 2016-03-29 NOTE — Telephone Encounter (Signed)
Pt confirmed appointment and received avs

## 2016-03-29 NOTE — Progress Notes (Signed)
[facility]  Telephone:([phone]) [phone] Fax:([phone]) [phone]  Clinic Follow up Note   Patient Care Team: Pcp Not In System as PCP - General 03/29/2016   CHIEF COMPLAINTS:  Follow up left breast cancer  Oncology History   Breast cancer of upper-outer quadrant of left female breast [facility])   Staging form: Breast, AJCC 7th Edition   - Pathologic: Stage IIA (T2, N0, cM0) - Unsigned      Breast cancer of upper-outer quadrant of left female breast ([place])   10/20/2015 Mammogram    Screening mammogram showed new nodular density on the left breast, measuring about 2 cm. the well defined nodules bilaterally are otherwise stable.      10/27/2015 Initial Biopsy    Left breast 2:00 position mass biopsy showed infiltrating ductal carcinoma.       10/27/2015 Initial Diagnosis    Breast cancer of upper-outer quadrant of left female breast ([place])      10/27/2015 Receptors her2    ER 100% positive, strong staining, PR 90% positive, strong staining, HER-2 IHC 2+, FISH negative.      11/16/2015 Surgery    Left breast lumpectomy and sentinel lymph node biopsy.      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 Margins were negative. One sentinel lymph node was negative. Lymphovascular invasion was negative.      11/16/2015 Oncotype testing    RS 23, intermediate risk, which predicts 10 year risk of distant recurrence 15% with tamoxifen      12/23/2015 - 03/01/2016 Adjuvant Chemotherapy    Docetaxel and Cytoxan, every 3 weeks, with Neulasta on day 2, for 4 cycles      03/27/2016 -  Radiation Therapy    Adjuvant breast radiation        HISTORY OF PRESENTING ILLNESS:  Nicole Schmitt 56 y.o. female is here because of Her recently resected left breast cancer. She presents to the clinic by herself. She was referred by her surgeon Dr. Excell Seltzer.  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 Her breast cancer was discovered by screening mammogram, and her initial workup was done in [place].She recently underwent a screening mamogram which revealed a possible mass in the left breast. Subsequent imaging included diagnostic mamogram showing a persistent area of asymmetric density in the left breast and ultrasound showing a 1.8 x 0.7 cm lobulated mass at the 2 o'clock position in the left breast 6 cm from the nipple. An ultrasound guided breast biopsy was performed on October 27, 2015 with pathology revealing invasive ductal carcinoma of the breast. She was referred to (surgeon Dr. Excell Seltzer, and underwent left breast lumpectomy and sentinel lymph node biopsy on 11/16/2015. The surgical pathology showed invasive grade 3 ductal carcinoma, 2.2 cm, one lymph node was negative.   She has been recovering well from her surgery. The pain to this surgical incision is minimal, she does not take much pain medication. No limited range of movement of left shoulder. Her appetite and energy level has been back to normal, she denies any other symptoms.  Her husband was diagnosed with metastatic melanoma in early 2016, has been receiving immunotherapy at [facility]. Her father was also recently diagnosed with CLL, but does not quite treatment. She has been under lot of stress in the past few years.  CURRENT THERAPY: Adjuvant breast radiation  INTERIM HISTORY: Nicole Schmitt returns  for follow-up. She tolerated the last cycle chemotherapy well months ago, still has moderate residual fatigue. She is able tolerate routine activities without difficulties. She also reports mild tingling of fingers and toes, no impact on her hand functions or balance. no other complaints. She has started adjuvant breast radiation earlier this week, tolerated well so far.  MEDICAL HISTORY:  Past Medical History:  Diagnosis Date  . Breast cancer ([place])   . Cancer ([place]) 1990   cervical   . Hypertension    . Seizures ([place])    > 20 years following fall from horse    SURGICAL HISTORY: Past Surgical History:  Procedure Laterality Date  . BREAST LUMPECTOMY WITH RADIOACTIVE SEED AND SENTINEL LYMPH NODE BIOPSY Left 11/16/2015   Procedure: BREAST LUMPECTOMY WITH RADIOACTIVE SEED AND SENTINEL LYMPH NODE BIOPSY;  Surgeon: Excell Seltzer, MD;  Location: [facility];  Service: General;  Laterality: Left;  . CESAREAN SECTION    . COLONOSCOPY    . WISDOM TOOTH EXTRACTION      SOCIAL HISTORY: Social History   Social History  . Marital status: Married    Spouse name: N/A  . Number of children: N/A  . Years of education: N/A   Occupational History  . Not on file.   Social History Main Topics  . Smoking status: Never Smoker  . Smokeless tobacco: Never Used  . Alcohol use Yes     Comment: rare  . Drug use: No  . Sexual activity: Yes   Other Topics Concern  . Not on file   Social History Narrative  . No narrative on file    FAMILY HISTORY: Family History  Problem Relation Age of Onset  . Cancer Maternal Uncle     liver cancer/environmental exposure  . Cancer Paternal Uncle     liver cancer/environmental exposure  . Epilepsy Mother   . Intracerebral hemorrhage Mother   . COPD Father   . Breast cancer Neg Hx     ALLERGIES:  has No Known Allergies.  MEDICATIONS:  Current Outpatient Prescriptions  Medication Sig Dispense Refill  . CLINDAGEL 1 % gel Apply 1 application topically daily.     . hydrochlorothiazide (HYDRODIURIL) 25 MG tablet Take 25 mg by mouth daily. 12.5 mg to 25 mg dependent on bp medication    . non-metallic deodorant (ALRA) MISC Apply 1 application topically daily as needed.    . valsartan (DIOVAN) 160 MG tablet Take 1 tablet (160 mg total) by mouth daily. 30 tablet 0  . Wound Cleansers (RADIAPLEX EX) Apply topically.     No current facility-administered medications for this visit.     REVIEW OF SYSTEMS:   Constitutional: Denies fevers,  chills or abnormal night sweats Eyes: Denies blurriness of vision, double vision or watery eyes Ears, nose, mouth, throat, and face: Denies mucositis or sore throat Respiratory: Denies cough, dyspnea or wheezes Cardiovascular: Denies palpitation, chest discomfort or lower extremity swelling Gastrointestinal:  Denies nausea, heartburn or change in bowel habits Skin: Denies abnormal skin rashes Lymphatics: Denies new lymphadenopathy or easy bruising Neurological:Denies numbness, tingling or new weaknesses Behavioral/Psych: Mood is stable, no new changes  All other systems were reviewed with the patient and are negative.  PHYSICAL EXAMINATION: ECOG PERFORMANCE STATUS: 1 - Symptomatic but completely ambulatory  Vitals:   03/29/16 0828  BP: 117/75  Pulse: 82  Resp: 18  Temp: 97.8 F (36.6 C)   Filed Weights   03/29/16 0828  Weight: 271 lb 14.4 oz (123.3 kg)  GENERAL:alert, no distress and comfortable SKIN: skin color, texture, turgor are normal, no rashes or significant lesions, (+) alopecia EYES: normal, conjunctiva are pink and non-injected, sclera clear OROPHARYNX:no exudate, no erythema and lips, buccal mucosa, and tongue normal  NECK: supple, thyroid normal size, non-tender, without nodularity LYMPH:  no palpable lymphadenopathy in the cervical, axillary or inguinal LUNGS: clear to auscultation and percussion with normal breathing effort HEART: regular rate & rhythm and no murmurs and no lower extremity edema ABDOMEN:abdomen soft, non-tender and normal bowel sounds Musculoskeletal:no cyanosis of digits and no clubbing  PSYCH: alert & oriented x 3 with fluent speech NEURO: no focal motor/sensory deficits Breasts: Breast inspection showed them to be symmetrical with no nipple discharge. Surgical scar in the left breast has healed well. Palpation of the breasts and axilla revealed no obvious mass that I could appreciate.    LABORATORY DATA:  I have reviewed the data as  listed CBC Latest Ref Rng & Units 03/29/2016 03/01/2016 02/15/2016  WBC 3.9 - 10.3 10e3/uL 7.5 14.8(H) 10.2  Hemoglobin 11.6 - 15.9 g/dL 12.0 11.6 9.9(L)  Hematocrit 34.8 - 46.6 % 36.5 36.0 29.3(L)  Platelets 145 - 400 10e3/uL 330 439(H) 234     CMP Latest Ref Rng & Units 03/01/2016 02/13/2016 02/12/2016  Glucose 70 - 140 mg/dl 193(H) 129(H) 121(H)  BUN 7.0 - 26.0 mg/dL 13._0 Creatinine 0.6 - 1.1 mg/dL 0.8 0.73 0.86  Sodium 136 - 145 mEq/L 141 133(L) 134(L)  Potassium 3.5 - 5.1 mEq/L 4.5 3.3(L) 3.5  Chloride 101 - 111 mmol/L - 104 101  CO2 22 - 29 mEq/L 19(L) 22 24  Calcium 8.4 - 10.4 mg/dL 9.7 8.1(L) 8.3(L)  Total Protein 6.4 - 8.3 g/dL 7.1 5.7(L) 6.3(L)  Total Bilirubin 0.20 - 1.20 mg/dL 0.34 0.9 0.8  Alkaline Phos 40 - 150 U/L 96 68 81  AST 5 - 34 U/L _1 ALT 0 - 55 U/L _2 PATHOLOGY REPORT Diagnosis 11/16/2015    1. Breast, lumpectomy, Left w/seed - INVASIVE GRADE 3 DUCTAL CARCINOMA, SPANNING 2.2 CM IN GREATEST DIMENSION. 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 - ASSOCIATED PROMINENT LYMPHOID RESPONSE. - INVASIVE DUCTAL CARCINOMA IS FOCALLY 0.1 TO 0.2 CM AWAY FROM MEDIAL MARGIN BUT MARGINAL SURFACE IS NEGATIVE FOR INVASIVE TUMOR. - DUCTAL CARCINOMA IN SITU IS FOCALLY 0.1 CM AWAY FROM LATERAL MARGIN - OTHER MARGINS ARE NEGATIVE. - SEE ONCOLOGY TEMPLATE. 2. Lymph node, sentinel, biopsy, Left Axillary - ONE BENIGN LYMPH NODE WITH NO TUMOR SEEN (0/1). Microscopic Comment 1. BREAST, INVASIVE TUMOR, WITH LYMPH NODES PRESENT Specimen, including laterality and lymph node sampling (sentinel, non-sentinel): Left partial breast with left axillary sentinel lymph node. Procedure: Left breast lumpectomy with left axillary sentinel lymph node biopsy. Histologic type: Invasive ductal carcinoma. Grade: 3. Tubule formation: 3. Nuclear pleomorphism: 2. Mitotic: 3. Tumor size (gross measurement): 2.2 cm. Margins: Invasive, distance to closest margin:  Invasive ductal carcinoma is focally 0.1 to 0.2 cm away from medial margin. In-situ, distance to closest margin: Ductal carcinoma in situ is focally 0.1 cm from lateral margin. If margin positive, focally or broadly: Marginal surface is not positive for tumor. Lymphovascular invasion: Definitive lymph/vascular invasion is not identified. Ductal carcinoma in situ: Yes. Grade: Intermediate grade. Extensive intraductal component: No. Lobular neoplasia: Not identified. Tumor focality: Unifocal. Extent of tumor: Tumor confined to breast parenchyma. Skin: Not received. Nipple: Not received. Skeletal muscle: Not received. Lymph nodes: Examined: 1 Sentinel. 0 Non-sentinel.  1 Total. Lymph nodes with metastasis: 0. Isolated tumor cells (< 0.2 mm): 0. Micrometastasis: (> 0.2 mm and < 2.0 mm): 0. Macrometastasis: (> 2.0 mm): 0. Extracapsular extension: Not applicable. Breast prognostic profile: Assessed on previous biopsy ([id number]): Estrogen receptor: 100%, positive. Progesterone receptor: 90%, positive. Her 2 neu: 2+ equivocal staining on immunohistochemical stain. FISH is pending on biopsy, per previous biospy report. Ki-67: 30%. Non-neoplastic breast: Fibrocystic changes. TNM: pT2, pN0. (RH:kh 11-18-15)  Diagnosis 10/27/2015 Consult Slide , Left Breast Biopsy - INVASIVE DUCTAL CARCINOMA, SEE COMMENT. - DUCTAL CARCINOMA IN SITU. Microscopic Comment While grading is best performed on the resection specimen the carcinoma appears grade 3. E-cadherin is positive confirming a ductal phenotype. Beta-catenin is positive. p53 exhibits weak staining. ER: Positive, 100%, strong staining intensity. PR: Positive, 90%, strong staining intensity. Ki-67: 30% Her2 IHC: 2+ Equivocal, FISH (-), with average copy number of 2, and a ratio of 1.07.  Oncotype RS 23, intermediate risk, which predicts 10 year risk of distant recurrence 15% with tamoxifen  RADIOGRAPHIC STUDIES: I have personally  reviewed the radiological images as listed and agreed with the findings in the report. No results found.  ASSESSMENT & PLAN: 56 y.o. Caucasian post menopause female, presented with screening discovered left breast cancer  1. 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 -We previously discussed her Oncotype DX test results. The recurrence score is 23, which is intermedia risk, it protects 10 year of distant recurrence risk of 15% with tamoxifen. The benefit of adjuvant chemotherapy intermediate risk group is uncertain, however given her young age, grade 3 disease, I think adjuvant chemo will probably reduce her risk of recurrence although the benefit is not very high. She is very concerned about cancer recurrence. After lengthy discussion, we decided to proceed with adjuvant chemotherapy -She has completed adjuvant chemotherapy -She has started adjuvant breast radiation, plan to finish on March 14 -We will plan to start her on adjuvant aromatase inhibitor after she completes her radiation -Continue breast cancer surveillance.  2. Fatigue and peripheral neuropathy, grade 1 -Secondary to adjuvant chemotherapy -I encouraged her to gradually increase her activity levels, and try to exercise -She has mild neuropathy, we'll continue observation.   3. HTN -She is on antihypertensive medication, including HCTZ -Follow-up of his primary care physician  PLAN -She will continue adjuvant breast radiation, plan to complete on March 14 -I'll see her back on March 14 2 finalize her adjuvant aromatase inhibitor  All questions were answered. The patient knows to call the clinic with any problems, questions or concerns.  I spent 15 minutes counseling the patient face  to face. The total time spent in the appointment was 20 minutes and more than 50% was on counseling.     [name], MD 03/29/2016

## 2016-03-29 NOTE — Telephone Encounter (Signed)
Phoned patient to discuss disability paperwork. No answer. Left message requesting return call.

## 2016-03-30 ENCOUNTER — Ambulatory Visit
Admission: RE | Admit: 2016-03-30 | Discharge: 2016-03-30 | Disposition: A | Discharge status: Home / Self Care | Payer: BLUE CROSS/BLUE SHIELD | Source: Ambulatory Visit | Attending: Radiation Oncology | Admitting: Radiation Oncology

## 2016-03-30 DIAGNOSIS — Z510 Encounter for antineoplastic radiation therapy: Secondary | ICD-10-CM | POA: Diagnosis not present

## 2016-03-31 ENCOUNTER — Ambulatory Visit
Admission: RE | Admit: 2016-03-31 | Discharge: 2016-03-31 | Disposition: A | Discharge status: Home / Self Care | Payer: BLUE CROSS/BLUE SHIELD | Source: Ambulatory Visit | Attending: Radiation Oncology | Admitting: Radiation Oncology

## 2016-03-31 VITALS — BP 96/73 | HR 85 | Resp 18 | Wt 274.0 lb

## 2016-03-31 DIAGNOSIS — Z170 Estrogen receptor positive status [ER+]: Secondary | ICD-10-CM | POA: Diagnosis not present

## 2016-03-31 DIAGNOSIS — Z510 Encounter for antineoplastic radiation therapy: Secondary | ICD-10-CM | POA: Insufficient documentation

## 2016-03-31 DIAGNOSIS — C50412 Malignant neoplasm of upper-outer quadrant of left female breast: Secondary | ICD-10-CM | POA: Diagnosis present

## 2016-03-31 MED ORDER — RADIAPLEXRX EX GEL
Freq: Once | CUTANEOUS | Status: AC
  Administered 2016-03-31: 14:00:00 via TOPICAL

## 2016-03-31 NOTE — Progress Notes (Signed)
Weight stable. BP low. Reports taking blood pressure medication last night. Reports she was seen in another office prior to this one and bp was normal. Denies feeling lightheaded or dizzy. No skin changes within treatment field. Reports occasional brief sharp pains in left breast but, understands this is related to healing from surgery. Reports fatigue. Scheduled to return to work Monday for first eight hour work day.   BP 96/73 (BP Location: Right Arm, Patient Position: Sitting, Cuff Size: Large)   Pulse 85   Resp 18   Wt 274 lb (124.3 kg)   SpO2 100%   BMI 45.60 kg/m  Wt Readings from Last 3 Encounters:  03/31/16 274 lb (124.3 kg)  03/29/16 271 lb 14.4 oz (123.3 kg)  03/16/16 267 lb (121.1 kg)

## 2016-03-31 NOTE — Addendum Note (Signed)
Encounter addended by: Heywood Footman, RN on: 03/31/2016  2:21 PM<BR>    Actions taken: Patient Education assessment filed, Order list changed, Diagnosis association updated, MAR administration accepted

## 2016-03-31 NOTE — Progress Notes (Signed)
  Radiation Oncology         [phone]   Name: Nicole Schmitt MRN: [id number]   Date: 03/31/2016  DOB: 1960-03-01     Weekly Radiation Therapy Management    ICD-9-CM ICD-10-CM   1. Malignant neoplasm of upper-outer quadrant of left breast in female, estrogen receptor positive (HCC) 174.4 C50.412    V86.0 Z17.0     Current Dose: 9 Gy  Planned Dose:  50.4 Gy  Narrative The patient presents for routine under treatment assessment.   Weight stable. BP low. Reports taking blood pressure medication last night. Reports she was seen in another office prior to this one and BP was normal. Denies feeling lightheaded or dizzy. No skin changes within treatment field. Reports occasional brief sharp pains in left breast but, understands this is related to healing from surgery. Reports fatigue. Scheduled to return to work Monday for first eight hour work day.    Set-up films were reviewed. The chart was checked.  Physical Findings  weight is 274 lb (124.3 kg). Her blood pressure is 96/73 and her pulse is 85. Her respiration is 18 and oxygen saturation is 100%. . Weight essentially stable. BP low. Alert and in no acute distress.  Impression The patient is tolerating radiation.  Plan Continue treatment as planned.      Sheral Apley Tammi Klippel, M.D.  This document serves as a record of services personally performed by Tyler Pita, MD. It was created on his behalf by Arlyce Harman, a trained medical scribe. The creation of this record is based on the scribe's personal observations and the provider's statements to them. This document has been checked and approved by the attending provider.

## 2016-04-03 ENCOUNTER — Telehealth: Payer: Self-pay | Admitting: Radiation Oncology

## 2016-04-03 ENCOUNTER — Ambulatory Visit
Admission: RE | Admit: 2016-04-03 | Discharge: 2016-04-03 | Disposition: A | Discharge status: Home / Self Care | Payer: BLUE CROSS/BLUE SHIELD | Source: Ambulatory Visit | Attending: Radiation Oncology | Admitting: Radiation Oncology

## 2016-04-03 DIAGNOSIS — Z510 Encounter for antineoplastic radiation therapy: Secondary | ICD-10-CM | POA: Diagnosis not present

## 2016-04-03 NOTE — Telephone Encounter (Signed)
Placed orange folder with complete ADA paperwork in Shona Simpson' inbox to sign.

## 2016-04-04 ENCOUNTER — Encounter: Payer: Self-pay | Admitting: Radiation Oncology

## 2016-04-04 ENCOUNTER — Ambulatory Visit
Admission: RE | Admit: 2016-04-04 | Discharge: 2016-04-04 | Disposition: A | Discharge status: Home / Self Care | Payer: BLUE CROSS/BLUE SHIELD | Source: Ambulatory Visit | Attending: Radiation Oncology | Admitting: Radiation Oncology

## 2016-04-04 DIAGNOSIS — Z510 Encounter for antineoplastic radiation therapy: Secondary | ICD-10-CM | POA: Diagnosis not present

## 2016-04-04 NOTE — Progress Notes (Signed)
Paperwork received, faxed to [facility] [phone], confirmation received, copy given to patient 2/5

## 2016-04-05 ENCOUNTER — Ambulatory Visit
Admission: RE | Admit: 2016-04-05 | Discharge: 2016-04-05 | Disposition: A | Discharge status: Home / Self Care | Payer: BLUE CROSS/BLUE SHIELD | Source: Ambulatory Visit | Attending: Radiation Oncology | Admitting: Radiation Oncology

## 2016-04-05 DIAGNOSIS — Z510 Encounter for antineoplastic radiation therapy: Secondary | ICD-10-CM | POA: Diagnosis not present

## 2016-04-06 ENCOUNTER — Ambulatory Visit
Admission: RE | Admit: 2016-04-06 | Discharge: 2016-04-06 | Disposition: A | Discharge status: Home / Self Care | Payer: BLUE CROSS/BLUE SHIELD | Source: Ambulatory Visit | Attending: Radiation Oncology | Admitting: Radiation Oncology

## 2016-04-06 DIAGNOSIS — Z510 Encounter for antineoplastic radiation therapy: Secondary | ICD-10-CM | POA: Diagnosis not present

## 2016-04-07 ENCOUNTER — Encounter: Payer: Self-pay | Admitting: Radiation Oncology

## 2016-04-07 ENCOUNTER — Ambulatory Visit
Admission: RE | Admit: 2016-04-07 | Discharge: 2016-04-07 | Disposition: A | Discharge status: Home / Self Care | Payer: BLUE CROSS/BLUE SHIELD | Source: Ambulatory Visit | Attending: Radiation Oncology | Admitting: Radiation Oncology

## 2016-04-07 VITALS — BP 118/70 | HR 78 | Resp 16 | Wt 273.8 lb

## 2016-04-07 DIAGNOSIS — C50412 Malignant neoplasm of upper-outer quadrant of left female breast: Secondary | ICD-10-CM

## 2016-04-07 DIAGNOSIS — Z170 Estrogen receptor positive status [ER+]: Principal | ICD-10-CM

## 2016-04-07 DIAGNOSIS — Z510 Encounter for antineoplastic radiation therapy: Secondary | ICD-10-CM | POA: Diagnosis not present

## 2016-04-07 NOTE — Progress Notes (Signed)
  Radiation Oncology         [phone]   Name: Nicole Schmitt MRN: [id number]   Date: 04/07/2016  DOB: 03-18-1960     Weekly Radiation Therapy Management    ICD-9-CM ICD-10-CM   1. Malignant neoplasm of upper-outer quadrant of left breast in female, estrogen receptor positive (HCC) 174.4 C50.412    V86.0 Z17.0     Current Dose: 18 Gy  Planned Dose:  50.4 Gy  Narrative The patient presents for routine under treatment assessment.  Weight and vitals stable. Denies pain. Reports tenderness and itchy skin within treatment field. No skin changes noted within treatment field. Reports using radiaplex and alra as directed. Reports fatigue.  Set-up films were reviewed. The chart was checked.  Physical Findings  weight is 273 lb 12.8 oz (124.2 kg). Her blood pressure is 118/70 and her pulse is 78. Her respiration is 16. . Weight essentially stable. Alert and in no acute distress. Minimal erythema and no desquamation within the treatment field.  Impression The patient is tolerating radiation.  Plan Continue treatment as planned.      Sheral Apley Tammi Klippel, M.D.  This document serves as a record of services personally performed by Tyler Pita, MD. It was created on his behalf by Arlyce Harman, a trained medical scribe. The creation of this record is based on the scribe's personal observations and the provider's statements to them. This document has been checked and approved by the attending provider.

## 2016-04-07 NOTE — Progress Notes (Signed)
Weight and vitals stable. Denies pain. Reports tenderness and itchy skin within treatment field. No skin changes noted within treatment field. Reports using radiaplex and alra as directed. Reports fatigue.   BP 118/70 (BP Location: Right Wrist, Patient Position: Sitting, Cuff Size: Small)   Pulse 78   Resp 16   Wt 273 lb 12.8 oz (124.2 kg)   BMI 45.56 kg/m  Wt Readings from Last 3 Encounters:  04/07/16 273 lb 12.8 oz (124.2 kg)  03/31/16 274 lb (124.3 kg)  03/29/16 271 lb 14.4 oz (123.3 kg)

## 2016-04-10 ENCOUNTER — Ambulatory Visit
Admission: RE | Admit: 2016-04-10 | Discharge: 2016-04-10 | Disposition: A | Discharge status: Home / Self Care | Payer: BLUE CROSS/BLUE SHIELD | Source: Ambulatory Visit | Attending: Radiation Oncology | Admitting: Radiation Oncology

## 2016-04-10 DIAGNOSIS — Z510 Encounter for antineoplastic radiation therapy: Secondary | ICD-10-CM | POA: Diagnosis not present

## 2016-04-11 ENCOUNTER — Ambulatory Visit
Admission: RE | Admit: 2016-04-11 | Discharge: 2016-04-11 | Disposition: A | Discharge status: Home / Self Care | Payer: BLUE CROSS/BLUE SHIELD | Source: Ambulatory Visit | Attending: Radiation Oncology | Admitting: Radiation Oncology

## 2016-04-11 DIAGNOSIS — Z510 Encounter for antineoplastic radiation therapy: Secondary | ICD-10-CM | POA: Diagnosis not present

## 2016-04-12 ENCOUNTER — Ambulatory Visit
Admission: RE | Admit: 2016-04-12 | Discharge: 2016-04-12 | Disposition: A | Discharge status: Home / Self Care | Payer: BLUE CROSS/BLUE SHIELD | Source: Ambulatory Visit | Attending: Radiation Oncology | Admitting: Radiation Oncology

## 2016-04-12 DIAGNOSIS — Z510 Encounter for antineoplastic radiation therapy: Secondary | ICD-10-CM | POA: Diagnosis not present

## 2016-04-13 ENCOUNTER — Ambulatory Visit
Admission: RE | Admit: 2016-04-13 | Discharge: 2016-04-13 | Disposition: A | Discharge status: Home / Self Care | Payer: BLUE CROSS/BLUE SHIELD | Source: Ambulatory Visit | Attending: Radiation Oncology | Admitting: Radiation Oncology

## 2016-04-13 DIAGNOSIS — Z510 Encounter for antineoplastic radiation therapy: Secondary | ICD-10-CM | POA: Diagnosis not present

## 2016-04-14 ENCOUNTER — Ambulatory Visit
Admission: RE | Admit: 2016-04-14 | Discharge: 2016-04-14 | Disposition: A | Discharge status: Home / Self Care | Payer: BLUE CROSS/BLUE SHIELD | Source: Ambulatory Visit | Attending: Radiation Oncology | Admitting: Radiation Oncology

## 2016-04-14 VITALS — BP 127/72 | HR 89 | Resp 18 | Wt 273.0 lb

## 2016-04-14 DIAGNOSIS — Z510 Encounter for antineoplastic radiation therapy: Secondary | ICD-10-CM | POA: Diagnosis not present

## 2016-04-14 DIAGNOSIS — Z170 Estrogen receptor positive status [ER+]: Principal | ICD-10-CM

## 2016-04-14 DIAGNOSIS — C50412 Malignant neoplasm of upper-outer quadrant of left female breast: Secondary | ICD-10-CM

## 2016-04-14 NOTE — Progress Notes (Signed)
  Radiation Oncology         [phone]   Name: Nicole Schmitt MRN: [id number]   Date: 04/14/2016  DOB: Jul 05, 1960     Weekly Radiation Therapy Management    ICD-9-CM ICD-10-CM   1. Malignant neoplasm of upper-outer quadrant of left breast in female, estrogen receptor positive (HCC) 174.4 C50.412    V86.0 Z17.0     Current Dose: 27 Gy  Planned Dose:  60.4 Gy  Narrative The patient presents for routine under treatment assessment.  Denies pain, but reports where her left axilla continues to be tender from SLN biopsy. No hyperpigmentation or desquamation of the left breast noted by the nurse. She continues to use Radiaplex bid as directed. Reports moderate fatigue.  Set-up films were reviewed. The chart was checked.  Physical Findings  weight is 273 lb (123.8 kg). Her blood pressure is 127/72 and her pulse is 89. Her respiration is 18 and oxygen saturation is 100%. . Weight essentially stable. Alert and in no acute distress. Minimal erythema within the treatment field.  Impression The patient is tolerating radiation.  Plan Continue treatment as planned.      Sheral Apley Tammi Klippel, M.D.  This document serves as a record of services personally performed by Tyler Pita, MD. It was created on his behalf by Darcus Austin, a trained medical scribe. The creation of this record is based on the scribe's personal observations and the provider's statements to them. This document has been checked and approved by the attending provider.

## 2016-04-14 NOTE — Progress Notes (Signed)
Weight and vitals stable. Denies pain. Reports left axilla where lymph node was manipulated continues to be tender. No hyperpigmentation or desquamation of left/treated breast noted. Reports she continues to use Radiaplex bid as directed. Reports moderate fatigue.   BP 127/72 (BP Location: Right Wrist, Patient Position: Sitting, Cuff Size: Small)   Pulse 89   Resp 18   Wt 273 lb (123.8 kg)   SpO2 100%   BMI 45.43 kg/m  Wt Readings from Last 3 Encounters:  04/14/16 273 lb (123.8 kg)  04/07/16 273 lb 12.8 oz (124.2 kg)  03/31/16 274 lb (124.3 kg)

## 2016-04-17 ENCOUNTER — Ambulatory Visit
Admission: RE | Admit: 2016-04-17 | Discharge: 2016-04-17 | Disposition: A | Discharge status: Home / Self Care | Payer: BLUE CROSS/BLUE SHIELD | Source: Ambulatory Visit | Attending: Radiation Oncology | Admitting: Radiation Oncology

## 2016-04-17 DIAGNOSIS — Z510 Encounter for antineoplastic radiation therapy: Secondary | ICD-10-CM | POA: Diagnosis not present

## 2016-04-18 ENCOUNTER — Ambulatory Visit
Admission: RE | Admit: 2016-04-18 | Discharge: 2016-04-18 | Disposition: A | Discharge status: Home / Self Care | Payer: BLUE CROSS/BLUE SHIELD | Source: Ambulatory Visit | Attending: Radiation Oncology | Admitting: Radiation Oncology

## 2016-04-18 DIAGNOSIS — Z510 Encounter for antineoplastic radiation therapy: Secondary | ICD-10-CM | POA: Diagnosis not present

## 2016-04-19 ENCOUNTER — Ambulatory Visit
Admission: RE | Admit: 2016-04-19 | Discharge: 2016-04-19 | Disposition: A | Discharge status: Home / Self Care | Payer: BLUE CROSS/BLUE SHIELD | Source: Ambulatory Visit | Attending: Radiation Oncology | Admitting: Radiation Oncology

## 2016-04-19 DIAGNOSIS — Z510 Encounter for antineoplastic radiation therapy: Secondary | ICD-10-CM | POA: Diagnosis not present

## 2016-04-20 ENCOUNTER — Ambulatory Visit
Admission: RE | Admit: 2016-04-20 | Discharge: 2016-04-20 | Disposition: A | Discharge status: Home / Self Care | Payer: BLUE CROSS/BLUE SHIELD | Source: Ambulatory Visit | Attending: Radiation Oncology | Admitting: Radiation Oncology

## 2016-04-20 DIAGNOSIS — Z510 Encounter for antineoplastic radiation therapy: Secondary | ICD-10-CM | POA: Diagnosis not present

## 2016-04-21 ENCOUNTER — Encounter: Payer: Self-pay | Admitting: Radiation Oncology

## 2016-04-21 ENCOUNTER — Ambulatory Visit
Admission: RE | Admit: 2016-04-21 | Discharge: 2016-04-21 | Disposition: A | Discharge status: Home / Self Care | Payer: BLUE CROSS/BLUE SHIELD | Source: Ambulatory Visit | Attending: Radiation Oncology | Admitting: Radiation Oncology

## 2016-04-21 VITALS — BP 126/77 | HR 77 | Temp 98.4°F | Ht 65.0 in | Wt 271.0 lb

## 2016-04-21 DIAGNOSIS — Z923 Personal history of irradiation: Secondary | ICD-10-CM | POA: Insufficient documentation

## 2016-04-21 DIAGNOSIS — C50412 Malignant neoplasm of upper-outer quadrant of left female breast: Secondary | ICD-10-CM | POA: Diagnosis not present

## 2016-04-21 DIAGNOSIS — Z170 Estrogen receptor positive status [ER+]: Secondary | ICD-10-CM | POA: Diagnosis not present

## 2016-04-21 DIAGNOSIS — Z510 Encounter for antineoplastic radiation therapy: Secondary | ICD-10-CM | POA: Diagnosis not present

## 2016-04-21 MED ORDER — BIAFINE EX EMUL
Freq: Once | CUTANEOUS | Status: AC
  Administered 2016-04-21: 10:00:00 via TOPICAL

## 2016-04-21 MED ORDER — RADIAPLEXRX EX GEL
Freq: Once | CUTANEOUS | Status: AC
  Administered 2016-04-21: 10:00:00 via TOPICAL

## 2016-04-21 NOTE — Progress Notes (Signed)
Nicole Schmitt has completed 20 fractions to her left breast.  She reports having some tenderness in her left breast, especially by the lymph node site.  She reports having fatigue.  She is using radiaplex and has been provided with a refill.  The skin on her left breast is red with dermatitis on the inner aspect.  She has also been given biafine to try for itching.  BP 126/77 (BP Location: Right Wrist, Patient Position: Sitting)   Pulse 77   Temp 98.4 F (36.9 C) (Oral)   Ht 5\' 5"  (1.651 m)   Wt 271 lb (122.9 kg)   SpO2 99%   BMI 45.10 kg/m    Wt Readings from Last 3 Encounters:  04/21/16 271 lb (122.9 kg)  04/14/16 273 lb (123.8 kg)  04/07/16 273 lb 12.8 oz (124.2 kg)

## 2016-04-21 NOTE — Progress Notes (Signed)
  Radiation Oncology         [phone]   Name: Nicole Schmitt MRN: [id number]   Date: 04/21/2016  DOB: 09-11-1960     Weekly Radiation Therapy Management    ICD-9-CM ICD-10-CM   1. Malignant neoplasm of upper-outer quadrant of left breast in female, estrogen receptor positive (HCC) 174.4 C50.412 hyaluronate sodium (RADIAPLEXRX) gel   V86.0 Z17.0 topical emolient (BIAFINE) emulsion    Current Dose: 36 Gy  Planned Dose:  60.4 Gy  Narrative The patient presents for routine under treatment assessment.  Nicole Schmitt has completed 20 fractions to her left breast. She reports having some tenderness in her left breast, especially by the lymph node site. She is using radiaplex and has been provided with a refill. The nurse notes the skin on her left breast is red with dermatitis on the inner aspect. The patient has pruritis of the treatment area and she was also provided with biafine to try for the itching. She reports fatigue.  Set-up films were reviewed. The chart was checked.  Physical Findings  height is 5\' 5"  (1.651 m) and weight is 271 lb (122.9 kg). Her oral temperature is 98.4 F (36.9 C). Her blood pressure is 126/77 and her pulse is 77. Her oxygen saturation is 99%. . Weight essentially stable. Alert and in no acute distress. Diffuse erythema with no desquamation of the left breast.  Impression The patient is tolerating radiation.  Plan Continue treatment as planned.      Sheral Apley Tammi Klippel, M.D.  This document serves as a record of services personally performed by Tyler Pita, MD. It was created on his behalf by Darcus Austin, a trained medical scribe. The creation of this record is based on the scribe's personal observations and the provider's statements to them. This document has been checked and approved by the attending provider.

## 2016-04-24 ENCOUNTER — Ambulatory Visit
Admission: RE | Admit: 2016-04-24 | Discharge: 2016-04-24 | Disposition: A | Discharge status: Home / Self Care | Payer: BLUE CROSS/BLUE SHIELD | Source: Ambulatory Visit | Attending: Radiation Oncology | Admitting: Radiation Oncology

## 2016-04-24 DIAGNOSIS — Z510 Encounter for antineoplastic radiation therapy: Secondary | ICD-10-CM | POA: Diagnosis not present

## 2016-04-25 ENCOUNTER — Ambulatory Visit
Admission: RE | Admit: 2016-04-25 | Discharge: 2016-04-25 | Disposition: A | Discharge status: Home / Self Care | Payer: BLUE CROSS/BLUE SHIELD | Source: Ambulatory Visit | Attending: Radiation Oncology | Admitting: Radiation Oncology

## 2016-04-25 DIAGNOSIS — Z510 Encounter for antineoplastic radiation therapy: Secondary | ICD-10-CM | POA: Diagnosis not present

## 2016-04-26 ENCOUNTER — Ambulatory Visit
Admission: RE | Admit: 2016-04-26 | Discharge: 2016-04-26 | Disposition: A | Discharge status: Home / Self Care | Payer: BLUE CROSS/BLUE SHIELD | Source: Ambulatory Visit | Attending: Radiation Oncology | Admitting: Radiation Oncology

## 2016-04-26 ENCOUNTER — Telehealth: Payer: Self-pay | Admitting: Radiation Oncology

## 2016-04-26 DIAGNOSIS — Z510 Encounter for antineoplastic radiation therapy: Secondary | ICD-10-CM | POA: Diagnosis not present

## 2016-04-26 NOTE — Telephone Encounter (Signed)
Received telephone message from patient requesting return call. Phoned patient back. She reports new onset of sharp shooting pain in treated breast. Explained this is related to healing from surgery and inflammation from radiation therapy. Reassured her this is a normal side effects associated with radiation therapy. Patient verbalized understanding, expressed relief and verbalized appreciation for the return call.

## 2016-04-27 ENCOUNTER — Ambulatory Visit
Admission: RE | Admit: 2016-04-27 | Discharge: 2016-04-27 | Disposition: A | Discharge status: Home / Self Care | Payer: BLUE CROSS/BLUE SHIELD | Source: Ambulatory Visit | Attending: Radiation Oncology | Admitting: Radiation Oncology

## 2016-04-27 DIAGNOSIS — C50412 Malignant neoplasm of upper-outer quadrant of left female breast: Secondary | ICD-10-CM

## 2016-04-27 DIAGNOSIS — Z510 Encounter for antineoplastic radiation therapy: Secondary | ICD-10-CM | POA: Diagnosis not present

## 2016-04-27 DIAGNOSIS — Z170 Estrogen receptor positive status [ER+]: Principal | ICD-10-CM

## 2016-04-27 NOTE — Progress Notes (Signed)
  Radiation Oncology         [phone]   Name: Nicole Schmitt MRN: [id number]   Date: 04/27/2016  DOB: 1960/09/19     Weekly Radiation Therapy Management    ICD-9-CM ICD-10-CM   1. Malignant neoplasm of upper-outer quadrant of left breast in female, estrogen receptor positive (HCC) 174.4 C50.412    V86.0 Z17.0     Current Dose: 43.2 Gy  Planned Dose:  60.4 Gy  Narrative The patient presents for routine under treatment assessment.  Nicole Schmitt has completed 24 fractions to her left breast.  She was seen today in the linac vault for me to set up her electrons, we talked there and I examined her.  Set-up films were reviewed. The chart was checked.  Physical Findings Alert and in no acute distress. Diffuse erythema with no desquamation of the left breast.  Impression The patient is tolerating radiation.  Plan Continue treatment as planned.      Sheral Apley Tammi Klippel, M.D.  This document serves as a record of services personally performed by Tyler Pita, MD. It was created on his behalf by Bethann Humble, a trained medical scribe. The creation of this record is based on the scribe's personal observations and the provider's statements to them. This document has been checked and approved by the attending provider.

## 2016-04-28 ENCOUNTER — Ambulatory Visit
Admission: RE | Admit: 2016-04-28 | Discharge: 2016-04-28 | Disposition: A | Discharge status: Home / Self Care | Payer: BLUE CROSS/BLUE SHIELD | Source: Ambulatory Visit | Attending: Radiation Oncology | Admitting: Radiation Oncology

## 2016-04-28 DIAGNOSIS — Z510 Encounter for antineoplastic radiation therapy: Secondary | ICD-10-CM | POA: Diagnosis not present

## 2016-05-01 ENCOUNTER — Ambulatory Visit
Admission: RE | Admit: 2016-05-01 | Discharge: 2016-05-01 | Disposition: A | Discharge status: Home / Self Care | Payer: BLUE CROSS/BLUE SHIELD | Source: Ambulatory Visit | Attending: Radiation Oncology | Admitting: Radiation Oncology

## 2016-05-01 DIAGNOSIS — Z510 Encounter for antineoplastic radiation therapy: Secondary | ICD-10-CM | POA: Diagnosis not present

## 2016-05-02 ENCOUNTER — Ambulatory Visit
Admission: RE | Admit: 2016-05-02 | Discharge: 2016-05-02 | Disposition: A | Discharge status: Home / Self Care | Payer: BLUE CROSS/BLUE SHIELD | Source: Ambulatory Visit | Attending: Radiation Oncology | Admitting: Radiation Oncology

## 2016-05-02 DIAGNOSIS — Z510 Encounter for antineoplastic radiation therapy: Secondary | ICD-10-CM | POA: Diagnosis not present

## 2016-05-03 ENCOUNTER — Ambulatory Visit
Admission: RE | Admit: 2016-05-03 | Discharge: 2016-05-03 | Disposition: A | Discharge status: Home / Self Care | Payer: BLUE CROSS/BLUE SHIELD | Source: Ambulatory Visit | Attending: Radiation Oncology | Admitting: Radiation Oncology

## 2016-05-03 DIAGNOSIS — Z510 Encounter for antineoplastic radiation therapy: Secondary | ICD-10-CM | POA: Diagnosis not present

## 2016-05-03 NOTE — Progress Notes (Signed)
[place]  [phone] [phone]  Clinic Follow up Note   Patient Care Team: Pcp Not In System as PCP - General 05/10/2016   CHIEF COMPLAINTS:  Follow up left breast cancer  Oncology History   Breast cancer of upper-outer quadrant of left female breast [facility])   Staging form: Breast, AJCC 7th Edition   - Pathologic: Stage IIA (T2, N0, cM0) - Unsigned      Breast cancer of upper-outer quadrant of left female breast ([place])   10/20/2015 Mammogram    Screening mammogram showed new nodular density on the left breast, measuring about 2 cm. the well defined nodules bilaterally are otherwise stable.      10/27/2015 Initial Biopsy    Left breast 2:00 position mass biopsy showed infiltrating ductal carcinoma.       10/27/2015 Initial Diagnosis    Breast cancer of upper-outer quadrant of left female breast ([place])      10/27/2015 Receptors her2    ER 100% positive, strong staining, PR 90% positive, strong staining, HER-2 IHC 2+, FISH negative.      11/16/2015 Surgery    Left breast lumpectomy and sentinel lymph node biopsy.      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 Margins were negative. One sentinel lymph node was negative. Lymphovascular invasion was negative.      11/16/2015 Oncotype testing    RS 23, intermediate risk, which predicts 10 year risk of distant recurrence 15% with tamoxifen      12/23/2015 - 03/01/2016 Adjuvant Chemotherapy    Docetaxel and Cytoxan, every 3 weeks, with Neulasta on day 2, for 4 cycles      03/27/2016 - 05/10/2016 Radiation Therapy    Adjuvant breast radiation 03/27/16 - 05/10/16 60.4 Gy in 33 fractions to the left breast by Dr. Tammi Klippel.       HISTORY OF PRESENTING ILLNESS:  Nicole Schmitt 56 y.o. female is here because of Her recently resected left breast cancer. She presents to the clinic by herself. She was referred  by her surgeon Dr. Excell Seltzer.  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 Her breast cancer was discovered by screening mammogram, and her initial workup was done in [place].She recently underwent a screening mamogram which revealed a possible mass in the left breast. Subsequent imaging included diagnostic mamogram showing a persistent area of asymmetric density in the left breast and ultrasound showing a 1.8 x 0.7 cm lobulated mass at the 2 o'clock position in the left breast 6 cm from the nipple. An ultrasound guided breast biopsy was performed on October 27, 2015 with pathology revealing invasive ductal carcinoma of the breast. She was referred to (surgeon Dr. Excell Seltzer, and underwent left breast lumpectomy and sentinel lymph node biopsy on 11/16/2015. The surgical pathology showed invasive grade 3 ductal carcinoma, 2.2 cm, one lymph node was negative.   She has been recovering well from her surgery. The pain to this surgical incision is minimal, she does not take much pain medication. No limited range of movement of left shoulder. Her appetite and energy level has been back to normal, she denies any other symptoms.  Her husband was diagnosed with metastatic melanoma in early 2016, has been receiving immunotherapy at [facility]. Her father was also recently diagnosed with CLL, but does not quite treatment. She has been under lot of stress in the past  few years.  CURRENT THERAPY: Pending Letrozole 2.5 mg, 1 tablet daily, to start 05/31/16  INTERIM HISTORY: Mrs Bendavid returns for follow-up. She reports more numbness than tingling in her hands and feet. She notices it more in her feet when she drives, wear shoes, or lie in bed. No impact on her hand functions or balance. no other complaints. She has completed adjuvant breast radiation today. She reports her last menstrual cycle was a year ago. She has mild joint pain. She has occasional night sweats.  MEDICAL  HISTORY:  Past Medical History:  Diagnosis Date  . Breast cancer ([place])   . Cancer ([place]) 1990   cervical   . Hypertension   . Seizures ([place])    > 20 years following fall from horse    SURGICAL HISTORY: Past Surgical History:  Procedure Laterality Date  . BREAST LUMPECTOMY WITH RADIOACTIVE SEED AND SENTINEL LYMPH NODE BIOPSY Left 11/16/2015   Procedure: BREAST LUMPECTOMY WITH RADIOACTIVE SEED AND SENTINEL LYMPH NODE BIOPSY;  Surgeon: Excell Seltzer, MD;  Location: [facility];  Service: General;  Laterality: Left;  . CESAREAN SECTION    . COLONOSCOPY    . WISDOM TOOTH EXTRACTION      SOCIAL HISTORY: Social History   Social History  . Marital status: Married    Spouse name: N/A  . Number of children: N/A  . Years of education: N/A   Occupational History  . Not on file.   Social History Main Topics  . Smoking status: Never Smoker  . Smokeless tobacco: Never Used  . Alcohol use Yes     Comment: rare  . Drug use: No  . Sexual activity: Yes   Other Topics Concern  . Not on file   Social History Narrative  . No narrative on file    FAMILY HISTORY: Family History  Problem Relation Age of Onset  . Cancer Maternal Uncle     liver cancer/environmental exposure  . Cancer Paternal Uncle     liver cancer/environmental exposure  . Epilepsy Mother   . Intracerebral hemorrhage Mother   . COPD Father   . Breast cancer Neg Hx     ALLERGIES:  has No Known Allergies.  MEDICATIONS:  Current Outpatient Prescriptions  Medication Sig Dispense Refill  . emollient (BIAFINE) cream Apply topically as needed.    . hydrochlorothiazide (HYDRODIURIL) 25 MG tablet Take 25 mg by mouth daily. 12.5 mg to 25 mg dependent on bp medication    . non-metallic deodorant (ALRA) MISC Apply 1 application topically daily as needed.    . valsartan (DIOVAN) 160 MG tablet Take 1 tablet (160 mg total) by mouth daily. 30 tablet 0  . Wound Cleansers (RADIAPLEX EX) Apply topically.      Marland Kitchen CLINDAGEL 1 % gel Apply 1 application topically daily.     Marland Kitchen letrozole (FEMARA) 2.5 MG tablet Take 1 tablet (2.5 mg total) by mouth daily. 30 tablet 2   No current facility-administered medications for this visit.     REVIEW OF SYSTEMS:   Constitutional: Denies fevers, chills (+) night sweats Eyes: Denies blurriness of vision, double vision or watery eyes Ears, nose, mouth, throat, and face: Denies mucositis or sore throat Respiratory: Denies cough, dyspnea or wheezes Cardiovascular: Denies palpitation, chest discomfort or lower extremity swelling Gastrointestinal:  Denies nausea, heartburn or change in bowel habits Skin: Denies abnormal skin rashes Lymphatics: Denies new lymphadenopathy or easy bruising Neurological: (+) Numbness in her hands in feet, more so in her feet. Behavioral/Psych:  Mood is stable, no new changes  All other systems were reviewed with the patient and are negative.  PHYSICAL EXAMINATION: ECOG PERFORMANCE STATUS: 1 - Symptomatic but completely ambulatory  Vitals:   05/10/16 1023  BP: 132/62  Pulse: (!) 59  Resp: 18  Temp: 98.5 F (36.9 C)   Filed Weights   05/10/16 1023  Weight: 274 lb 1.6 oz (124.3 kg)    GENERAL:alert, no distress and comfortable SKIN: skin color, texture, turgor are normal, no rashes or significant lesions, (+) alopecia EYES: normal, conjunctiva are pink and non-injected, sclera clear OROPHARYNX:no exudate, no erythema and lips, buccal mucosa, and tongue normal  NECK: supple, thyroid normal size, non-tender, without nodularity LYMPH:  no palpable lymphadenopathy in the cervical, axillary or inguinal LUNGS: clear to auscultation and percussion with normal breathing effort HEART: regular rate & rhythm and no murmurs and no lower extremity edema ABDOMEN:abdomen soft, non-tender and normal bowel sounds Musculoskeletal:no cyanosis of digits and no clubbing  PSYCH: alert & oriented x 3 with fluent speech NEURO: no focal  motor/sensory deficits BREAST: Breast inspection showed them to be symmetrical with no nipple discharge. (+) Surgical scar in the left breast has healed well. Diffuse erythema and skin pigmentation of the left breast and axilla, no skin breakdown. Palpation of the breasts and axilla revealed no obvious mass that I could appreciate.  LABORATORY DATA:  I have reviewed the data as listed CBC Latest Ref Rng & Units 05/10/2016 03/29/2016 03/01/2016  WBC 3.9 - 10.3 10e3/uL 5.0 7.5 14.8(H)  Hemoglobin 11.6 - 15.9 g/dL 12.7 12.0 11.6  Hematocrit 34.8 - 46.6 % 38.1 36.5 36.0  Platelets 145 - 400 10e3/uL 262 330 439(H)     CMP Latest Ref Rng & Units 05/10/2016 03/29/2016 03/01/2016  Glucose 70 - 140 mg/dl 95 103 193(H)  BUN 7.0 - 26.0 mg/dL 14.8 13.3 13.5  Creatinine 0.6 - 1.1 mg/dL 0.7 0.7 0.8  Sodium 136 - 145 mEq/L 141 141 141  Potassium 3.5 - 5.1 mEq/L 3.9 3.8 4.5  Chloride 101 - 111 mmol/L - - -  CO2 22 - 29 mEq/L 22 23 19(L)  Calcium 8.4 - 10.4 mg/dL 9.5 9.4 9.7  Total Protein 6.4 - 8.3 g/dL 6.6 6.1(L) 7.1  Total Bilirubin 0.20 - 1.20 mg/dL 0.38 0.59 0.34  Alkaline Phos 40 - 150 U/L 97 82 96  AST 5 - 34 U/L _0 ALT 0 - 55 U/L _1 PATHOLOGY REPORT Diagnosis 11/16/2015    1. Breast, lumpectomy, Left w/seed - INVASIVE GRADE 3 DUCTAL CARCINOMA, SPANNING 2.2 CM IN GREATEST DIMENSION. 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 - ASSOCIATED PROMINENT LYMPHOID RESPONSE. - INVASIVE DUCTAL CARCINOMA IS FOCALLY 0.1 TO 0.2 CM AWAY FROM MEDIAL MARGIN BUT MARGINAL SURFACE IS NEGATIVE FOR INVASIVE TUMOR. - DUCTAL CARCINOMA IN SITU IS FOCALLY 0.1 CM AWAY FROM LATERAL MARGIN - OTHER MARGINS ARE NEGATIVE. - SEE ONCOLOGY TEMPLATE. 2. Lymph node, sentinel, biopsy, Left Axillary - ONE BENIGN LYMPH NODE WITH NO TUMOR SEEN (0/1). Microscopic Comment 1. BREAST, INVASIVE TUMOR, WITH LYMPH NODES PRESENT Specimen, including laterality and lymph node sampling (sentinel, non-sentinel):  Left partial breast with left axillary sentinel lymph node. Procedure: Left breast lumpectomy with left axillary sentinel lymph node biopsy. Histologic type: Invasive ductal carcinoma. Grade: 3. Tubule formation: 3. Nuclear pleomorphism: 2. Mitotic: 3. Tumor size (gross measurement): 2.2 cm. Margins: Invasive, distance to closest margin: Invasive ductal carcinoma is focally 0.1 to 0.2 cm away  from medial margin. In-situ, distance to closest margin: Ductal carcinoma in situ is focally 0.1 cm from lateral margin. If margin positive, focally or broadly: Marginal surface is not positive for tumor. Lymphovascular invasion: Definitive lymph/vascular invasion is not identified. Ductal carcinoma in situ: Yes. Grade: Intermediate grade. Extensive intraductal component: No. Lobular neoplasia: Not identified. Tumor focality: Unifocal. Extent of tumor: Tumor confined to breast parenchyma. Skin: Not received. Nipple: Not received. Skeletal muscle: Not received. Lymph nodes: Examined: 1 Sentinel. 0 Non-sentinel. 1 Total. Lymph nodes with metastasis: 0. Isolated tumor cells (< 0.2 mm): 0. Micrometastasis: (> 0.2 mm and < 2.0 mm): 0. Macrometastasis: (> 2.0 mm): 0. Extracapsular extension: Not applicable. Breast prognostic profile: Assessed on previous biopsy ([id number]): Estrogen receptor: 100%, positive. Progesterone receptor: 90%, positive. Her 2 neu: 2+ equivocal staining on immunohistochemical stain. FISH is pending on biopsy, per previous biospy report. Ki-67: 30%. Non-neoplastic breast: Fibrocystic changes. TNM: pT2, pN0. (RH:kh 11-18-15)  Diagnosis 10/27/2015 Consult Slide , Left Breast Biopsy - INVASIVE DUCTAL CARCINOMA, SEE COMMENT. - DUCTAL CARCINOMA IN SITU. Microscopic Comment While grading is best performed on the resection specimen the carcinoma appears grade 3. E-cadherin is positive confirming a ductal phenotype. Beta-catenin is positive. p53 exhibits weak  staining. ER: Positive, 100%, strong staining intensity. PR: Positive, 90%, strong staining intensity. Ki-67: 30% Her2 IHC: 2+ Equivocal, FISH (-), with average copy number of 2, and a ratio of 1.07.  Oncotype RS 23, intermediate risk, which predicts 10 year risk of distant recurrence 15% with tamoxifen  RADIOGRAPHIC STUDIES: I have personally reviewed the radiological images as listed and agreed with the findings in the report. No results found.  ASSESSMENT & PLAN: 56 y.o. Caucasian post menopause female, presented with screening discovered left breast cancer  1. 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 -We previously discussed her Oncotype DX test results. The recurrence score is 23, which is intermedia risk, it protects 10 year of distant recurrence risk of 15% with tamoxifen. The benefit of adjuvant chemotherapy intermediate risk group is uncertain, however given her young age, grade 3 disease, I think adjuvant chemo will probably reduce her risk of recurrence although the benefit is not very high. She is very concerned about cancer recurrence. After lengthy discussion, we decided to proceed with adjuvant chemotherapy -She has completed adjuvant chemotherapy -She has completed adjuvant radiation. 03/27/16 - 05/10/16; 60.4 Gy in 33 fractions to the left breast by Dr. Tammi Klippel, tolerated well. --Given the strong ER and PR positivity and her post menopause status, I do recommend adjuvant aromatase inhibitor to reduce her risk of cancer recurrence,  The potential benefit and side effects, which includes but not limited to, hot flash, skin and vaginal dryness, metabolic changes ( increased blood glucose, cholesterol, weight, etc.), slightly in increased risk  of cardiovascular disease, cataracts, muscular and joint discomfort, osteopenia and osteoporosis, etc, were discussed with her in great details. She is interested. -I will prescribe Letrozole 2.5 mg, 1 tablet daily. To start in 3 weeks. -Continue breast cancer surveillance with yearly mammograms, self-exams, and clinic exam. -I encouraged her to eat healthy and exercise regularly  2. Fatigue and peripheral neuropathy, grade 1 -Secondary to adjuvant chemotherapy -I encouraged her to gradually increase her activity levels, and try to exercise -She has mild neuropathy, we'll continue observation.  -  I advised her to take VitB complex to help.  3. HTN -She is on antihypertensive medication, including HCTZ -Follow-up of his primary care physician  4. Bone Health -The patient reports never having a bone scan. -Bone scan in 3 months.  5. Genetic Counseling -The patient inquired about genetic testing. -We discussed that given she does not have a family history of breast cancer, genetic counseling is not recommended. -We discussed it is likely insurance may not cover the testing and that an out of pocket cost would be ~$200. -She has a daughter and her daughter should start mammograms at age 25.  73. Obesity -I strongly encouraged her to have healthy diet, and exercise regularly, try to lose some weight. She is motivated, will try.   PLAN -Start Letrozole 2.5 mg daily in 3 weeks. -Lab and f/u in 2 months. -[facility].  All questions were answered. The patient knows to call the clinic with any problems, questions or concerns.  I spent 25 minutes counseling the patient face to face. The total time spent in the appointment was 30 minutes and more than 50% was on counseling.     [name], MD 05/10/2016   This document serves as a record of services personally performed by [name], MD. It was created on her behalf by Darcus Austin, a trained medical scribe.  The creation of this record is based on the scribe's personal observations and the provider's statements to them. This document has been checked and approved by the attending provider.

## 2016-05-04 ENCOUNTER — Ambulatory Visit
Admission: RE | Admit: 2016-05-04 | Discharge: 2016-05-04 | Disposition: A | Discharge status: Home / Self Care | Payer: BLUE CROSS/BLUE SHIELD | Source: Ambulatory Visit | Attending: Radiation Oncology | Admitting: Radiation Oncology

## 2016-05-04 DIAGNOSIS — Z510 Encounter for antineoplastic radiation therapy: Secondary | ICD-10-CM | POA: Diagnosis not present

## 2016-05-05 ENCOUNTER — Ambulatory Visit
Admission: RE | Admit: 2016-05-05 | Discharge: 2016-05-05 | Disposition: A | Discharge status: Home / Self Care | Payer: BLUE CROSS/BLUE SHIELD | Source: Ambulatory Visit | Attending: Radiation Oncology | Admitting: Radiation Oncology

## 2016-05-05 DIAGNOSIS — Z510 Encounter for antineoplastic radiation therapy: Secondary | ICD-10-CM | POA: Diagnosis not present

## 2016-05-08 ENCOUNTER — Ambulatory Visit
Admission: RE | Admit: 2016-05-08 | Discharge: 2016-05-08 | Disposition: A | Discharge status: Home / Self Care | Payer: BLUE CROSS/BLUE SHIELD | Source: Ambulatory Visit | Attending: Radiation Oncology | Admitting: Radiation Oncology

## 2016-05-08 DIAGNOSIS — Z510 Encounter for antineoplastic radiation therapy: Secondary | ICD-10-CM | POA: Diagnosis not present

## 2016-05-09 ENCOUNTER — Ambulatory Visit
Admission: RE | Admit: 2016-05-09 | Discharge: 2016-05-09 | Disposition: A | Discharge status: Home / Self Care | Payer: BLUE CROSS/BLUE SHIELD | Source: Ambulatory Visit | Attending: Radiation Oncology | Admitting: Radiation Oncology

## 2016-05-09 DIAGNOSIS — Z510 Encounter for antineoplastic radiation therapy: Secondary | ICD-10-CM | POA: Diagnosis not present

## 2016-05-10 ENCOUNTER — Ambulatory Visit
Admission: RE | Admit: 2016-05-10 | Discharge: 2016-05-10 | Disposition: A | Discharge status: Home / Self Care | Payer: BLUE CROSS/BLUE SHIELD | Source: Ambulatory Visit | Attending: Radiation Oncology | Admitting: Radiation Oncology

## 2016-05-10 ENCOUNTER — Encounter: Payer: Self-pay | Admitting: Hematology

## 2016-05-10 ENCOUNTER — Other Ambulatory Visit (HOSPITAL_BASED_OUTPATIENT_CLINIC_OR_DEPARTMENT_OTHER): Payer: BLUE CROSS/BLUE SHIELD

## 2016-05-10 ENCOUNTER — Encounter: Payer: Self-pay | Admitting: Radiation Oncology

## 2016-05-10 ENCOUNTER — Telehealth: Payer: Self-pay | Admitting: Hematology

## 2016-05-10 ENCOUNTER — Ambulatory Visit (HOSPITAL_BASED_OUTPATIENT_CLINIC_OR_DEPARTMENT_OTHER): Payer: BLUE CROSS/BLUE SHIELD | Admitting: Hematology

## 2016-05-10 VITALS — BP 132/62 | HR 59 | Temp 98.5°F | Resp 18 | Ht 65.0 in | Wt 274.1 lb

## 2016-05-10 DIAGNOSIS — Z170 Estrogen receptor positive status [ER+]: Secondary | ICD-10-CM | POA: Diagnosis not present

## 2016-05-10 DIAGNOSIS — G620 Drug-induced polyneuropathy: Secondary | ICD-10-CM | POA: Diagnosis not present

## 2016-05-10 DIAGNOSIS — I10 Essential (primary) hypertension: Secondary | ICD-10-CM | POA: Diagnosis not present

## 2016-05-10 DIAGNOSIS — C50412 Malignant neoplasm of upper-outer quadrant of left female breast: Secondary | ICD-10-CM

## 2016-05-10 DIAGNOSIS — E2839 Other primary ovarian failure: Secondary | ICD-10-CM

## 2016-05-10 DIAGNOSIS — Z510 Encounter for antineoplastic radiation therapy: Secondary | ICD-10-CM | POA: Diagnosis not present

## 2016-05-10 LAB — CBC WITH DIFFERENTIAL/PLATELET
BASO%: 0.8 % (ref 0.0–2.0)
Basophils Absolute: 0 10*3/uL (ref 0.0–0.1)
EOS%: 2.7 % (ref 0.0–7.0)
Eosinophils Absolute: 0.1 10*3/uL (ref 0.0–0.5)
HCT: 38.1 % (ref 34.8–46.6)
HEMOGLOBIN: 12.7 g/dL (ref 11.6–15.9)
LYMPH%: 28.7 % (ref 14.0–49.7)
MCH: 29.4 pg (ref 25.1–34.0)
MCHC: 33.5 g/dL (ref 31.5–36.0)
MCV: 87.8 fL (ref 79.5–101.0)
MONO#: 0.6 10*3/uL (ref 0.1–0.9)
MONO%: 11.8 % (ref 0.0–14.0)
NEUT%: 56 % (ref 38.4–76.8)
NEUTROS ABS: 2.8 10*3/uL (ref 1.5–6.5)
Platelets: 262 10*3/uL (ref 145–400)
RBC: 4.34 10*6/uL (ref 3.70–5.45)
RDW: 13.9 % (ref 11.2–14.5)
WBC: 5 10*3/uL (ref 3.9–10.3)
lymph#: 1.4 10*3/uL (ref 0.9–3.3)

## 2016-05-10 LAB — COMPREHENSIVE METABOLIC PANEL
ALBUMIN: 3.5 g/dL (ref 3.5–5.0)
ALK PHOS: 97 U/L (ref 40–150)
ALT: 18 U/L (ref 0–55)
AST: 17 U/L (ref 5–34)
Anion Gap: 10 mEq/L (ref 3–11)
BILIRUBIN TOTAL: 0.38 mg/dL (ref 0.20–1.20)
BUN: 14.8 mg/dL (ref 7.0–26.0)
CO2: 22 meq/L (ref 22–29)
Calcium: 9.5 mg/dL (ref 8.4–10.4)
Chloride: 109 mEq/L (ref 98–109)
Creatinine: 0.7 mg/dL (ref 0.6–1.1)
Glucose: 95 mg/dl (ref 70–140)
Potassium: 3.9 mEq/L (ref 3.5–5.1)
SODIUM: 141 meq/L (ref 136–145)
TOTAL PROTEIN: 6.6 g/dL (ref 6.4–8.3)

## 2016-05-10 MED ORDER — LETROZOLE 2.5 MG PO TABS: 2.5000 mg | ORAL_TABLET | Freq: Every day | ORAL | 2 refills | Status: DC

## 2016-05-10 NOTE — Telephone Encounter (Signed)
Appointments scheduled per 05/10/16 los. Patient was given a copy of the AVS report and appointment schedule per 05/10/16 los.

## 2016-05-11 NOTE — Progress Notes (Signed)
  Radiation Oncology         [phone] ________________________________  Name: Nicole Schmitt MRN: [id number]  Date: 05/10/2016  DOB: [birth year]  End of Treatment Note  Diagnosis:   56 yo woman with grade III stage T2 N0 M0 ER+ Invasive Ductal Carcinoma of the upper outer left breast - Stage IIA     Indication for treatment:  Curative       Radiation treatment dates:   03/27/2016 to 05/10/2016  Site/dose:    1. The left breast was treated to 50.4 Gy in 28 fractions at 1.8 Gy per fraction. 2. The left breast was boosted to 10 Gy in 5 fractions at 2 Gy per fraction.   Beams/energy:    1. 3D // 10X, 6X 2. Isodose plan // 12 MeV  Narrative: The patient tolerated radiation treatment relatively well.   She developed diffuse erythema with no desquamation of the left breast.   Plan: The patient has completed radiation treatment. The patient will return to radiation oncology clinic for routine followup in one month. I advised her to call or return sooner if she has any questions or concerns related to her recovery or treatment. ________________________________  Sheral Apley. Tammi Klippel, M.D.   This document serves as a record of services personally performed by Tyler Pita, MD. It was created on his behalf by Arlyce Harman, a trained medical scribe. The creation of this record is based on the scribe's personal observations and the provider's statements to them. This document has been checked and approved by the attending provider.

## 2016-06-14 ENCOUNTER — Other Ambulatory Visit: Payer: BLUE CROSS/BLUE SHIELD

## 2016-06-14 ENCOUNTER — Ambulatory Visit: Payer: BLUE CROSS/BLUE SHIELD | Admitting: Hematology

## 2016-07-07 ENCOUNTER — Other Ambulatory Visit: Payer: Self-pay | Admitting: *Deleted

## 2016-07-07 MED ORDER — LETROZOLE 2.5 MG PO TABS: 2.5000 mg | ORAL_TABLET | Freq: Every day | ORAL | 2 refills | Status: DC

## 2016-07-11 ENCOUNTER — Other Ambulatory Visit: Payer: Self-pay | Admitting: *Deleted

## 2016-07-12 ENCOUNTER — Encounter: Payer: Self-pay | Admitting: Hematology

## 2016-07-12 ENCOUNTER — Telehealth: Payer: Self-pay | Admitting: Hematology

## 2016-07-12 ENCOUNTER — Other Ambulatory Visit (HOSPITAL_BASED_OUTPATIENT_CLINIC_OR_DEPARTMENT_OTHER): Payer: BLUE CROSS/BLUE SHIELD

## 2016-07-12 ENCOUNTER — Ambulatory Visit (HOSPITAL_BASED_OUTPATIENT_CLINIC_OR_DEPARTMENT_OTHER): Payer: BLUE CROSS/BLUE SHIELD | Admitting: Hematology

## 2016-07-12 VITALS — BP 127/64 | HR 70 | Temp 98.4°F | Resp 17 | Ht 65.0 in | Wt 275.9 lb

## 2016-07-12 DIAGNOSIS — Z79811 Long term (current) use of aromatase inhibitors: Secondary | ICD-10-CM

## 2016-07-12 DIAGNOSIS — E669 Obesity, unspecified: Secondary | ICD-10-CM

## 2016-07-12 DIAGNOSIS — G620 Drug-induced polyneuropathy: Secondary | ICD-10-CM | POA: Diagnosis not present

## 2016-07-12 DIAGNOSIS — I10 Essential (primary) hypertension: Secondary | ICD-10-CM

## 2016-07-12 DIAGNOSIS — C50412 Malignant neoplasm of upper-outer quadrant of left female breast: Secondary | ICD-10-CM

## 2016-07-12 DIAGNOSIS — Z170 Estrogen receptor positive status [ER+]: Secondary | ICD-10-CM | POA: Diagnosis not present

## 2016-07-12 LAB — CBC WITH DIFFERENTIAL/PLATELET
BASO%: 0.5 % (ref 0.0–2.0)
BASOS ABS: 0 10*3/uL (ref 0.0–0.1)
EOS ABS: 0.1 10*3/uL (ref 0.0–0.5)
EOS%: 2 % (ref 0.0–7.0)
HEMATOCRIT: 37.8 % (ref 34.8–46.6)
HEMOGLOBIN: 12.7 g/dL (ref 11.6–15.9)
LYMPH#: 1.6 10*3/uL (ref 0.9–3.3)
LYMPH%: 25.2 % (ref 14.0–49.7)
MCH: 28.4 pg (ref 25.1–34.0)
MCHC: 33.6 g/dL (ref 31.5–36.0)
MCV: 84.5 fL (ref 79.5–101.0)
MONO#: 0.6 10*3/uL (ref 0.1–0.9)
MONO%: 9.7 % (ref 0.0–14.0)
NEUT#: 4 10*3/uL (ref 1.5–6.5)
NEUT%: 62.6 % (ref 38.4–76.8)
Platelets: 261 10*3/uL (ref 145–400)
RBC: 4.47 10*6/uL (ref 3.70–5.45)
RDW: 14.5 % (ref 11.2–14.5)
WBC: 6.4 10*3/uL (ref 3.9–10.3)

## 2016-07-12 LAB — COMPREHENSIVE METABOLIC PANEL
ALBUMIN: 3.3 g/dL — AB (ref 3.5–5.0)
ALK PHOS: 94 U/L (ref 40–150)
ALT: 19 U/L (ref 0–55)
AST: 16 U/L (ref 5–34)
Anion Gap: 8 mEq/L (ref 3–11)
BUN: 15.7 mg/dL (ref 7.0–26.0)
CO2: 26 mEq/L (ref 22–29)
CREATININE: 0.8 mg/dL (ref 0.6–1.1)
Calcium: 9.7 mg/dL (ref 8.4–10.4)
Chloride: 105 mEq/L (ref 98–109)
EGFR: 89 mL/min/{1.73_m2} — ABNORMAL LOW (ref >90)
Glucose: 104 mg/dl (ref 70–140)
Potassium: 3.6 mEq/L (ref 3.5–5.1)
Sodium: 138 mEq/L (ref 136–145)
Total Bilirubin: 0.45 mg/dL (ref 0.20–1.20)
Total Protein: 6.9 g/dL (ref 6.4–8.3)

## 2016-07-12 NOTE — Telephone Encounter (Signed)
Gave patient AVS and calender per 5/16 los. - No Mammo ordered per LOS message sent to Dr. Burr Medico.

## 2016-07-12 NOTE — Addendum Note (Signed)
Addended by: [name] on: 07/12/2016 04:45 PM   Modules accepted: Orders

## 2016-07-12 NOTE — Progress Notes (Signed)
[place]  [phone] Fax:([phone]) [phone]  Clinic Follow up Note   Patient Care Team: System, Pcp Not In as PCP - General 07/12/2016   CHIEF COMPLAINTS:  Follow up left breast cancer  Oncology History   Breast cancer of upper-outer quadrant of left female breast [facility])   Staging form: Breast, AJCC 7th Edition   - Pathologic: Stage IIA (T2, N0, cM0) - Unsigned      Breast cancer of upper-outer quadrant of left female breast ([place])   10/20/2015 Mammogram    Screening mammogram showed new nodular density on the left breast, measuring about 2 cm. the well defined nodules bilaterally are otherwise stable.      10/27/2015 Initial Biopsy    Left breast 2:00 position mass biopsy showed infiltrating ductal carcinoma.       10/27/2015 Initial Diagnosis    Breast cancer of upper-outer quadrant of left female breast ([place])      10/27/2015 Receptors her2    ER 100% positive, strong staining, PR 90% positive, strong staining, HER-2 IHC 2+, FISH negative.      11/16/2015 Surgery    Left breast lumpectomy and sentinel lymph node biopsy.      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 Margins were negative. One sentinel lymph node was negative. Lymphovascular invasion was negative.      11/16/2015 Oncotype testing    RS 23, intermediate risk, which predicts 10 year risk of distant recurrence 15% with tamoxifen      12/23/2015 - 03/01/2016 Adjuvant Chemotherapy    Docetaxel and Cytoxan, every 3 weeks, with Neulasta on day 2, for 4 cycles      03/27/2016 - 05/10/2016 Radiation Therapy    Adjuvant breast radiation 03/27/16 - 05/10/16 60.4 Gy in 33 fractions to the left breast by Dr. Tammi Klippel.      05/31/2016 -  Anti-estrogen oral therapy    Letrozole 2.5 mg daily        HISTORY OF PRESENTING ILLNESS:  Nicole Schmitt 56 y.o. female is here because of Her recently resected  left breast cancer. She presents to the clinic by herself. She was referred by her surgeon Dr. Excell Seltzer.  She lives just outside of [place] but works as an Therapist, sports on the Advertising account planner at [employer]. Her breast cancer was discovered by screening mammogram, and her initial workup was done in [place].She recently underwent a screening mamogram which revealed a possible mass in the left breast. Subsequent imaging included diagnostic mamogram showing a persistent area of asymmetric density in the left breast and ultrasound showing a 1.8 x 0.7 cm lobulated mass at the 2 o'clock position in the left breast 6 cm from the nipple. An ultrasound guided breast biopsy was performed on October 27, 2015 with pathology revealing invasive ductal carcinoma of the breast. She was referred to (surgeon Dr. Excell Seltzer, and underwent left breast lumpectomy and sentinel lymph node biopsy on 11/16/2015. The surgical pathology showed invasive grade 3 ductal carcinoma, 2.2 cm, one lymph node was negative.   She has been recovering well from her surgery. The pain to this surgical incision is minimal, she does not take much pain medication. No limited range of movement of left shoulder. Her appetite and energy level has been back to normal, she denies any other symptoms.  Her husband was diagnosed with metastatic melanoma in early 2016, has been receiving immunotherapy at [facility]. Her father was also  recently diagnosed with CLL, but does not quite treatment. She has been under lot of stress in the past few years.  CURRENT THERAPY: Letrozole 2.5 mg, 1 tablet daily, to start 05/31/16  INTERIM HISTORY: Nicole Schmitt returns for follow-up. She presents to the clinic today reporting she her hair is growing back and she still feels tired. She is not back to the level she was. She has swelling and pain in her hands along with neuropathy numbness in her hands and feet. She is back to work. But she is still overall doing well. She is 70-80% back and  also did a cancer 5K. Joint aches possibly due to age. And she has night sweats and not many during the day. She wakes up a lot at night due to it. She reports she is able to manage it without additional medication. She is not a person who likes to take pills so she has occasionally forgot to take her letrozole.    MEDICAL HISTORY:  Past Medical History:  Diagnosis Date  . Breast cancer ([place])   . Cancer ([place]) 1990   cervical   . Hypertension   . Seizures ([place])    > 20 years following fall from horse    SURGICAL HISTORY: Past Surgical History:  Procedure Laterality Date  . BREAST LUMPECTOMY WITH RADIOACTIVE SEED AND SENTINEL LYMPH NODE BIOPSY Left 11/16/2015   Procedure: BREAST LUMPECTOMY WITH RADIOACTIVE SEED AND SENTINEL LYMPH NODE BIOPSY;  Surgeon: Excell Seltzer, MD;  Location: [facility];  Service: General;  Laterality: Left;  . CESAREAN SECTION    . COLONOSCOPY    . WISDOM TOOTH EXTRACTION      SOCIAL HISTORY: Social History   Social History  . Marital status: Married    Spouse name: N/A  . Number of children: N/A  . Years of education: N/A   Occupational History  . Not on file.   Social History Main Topics  . Smoking status: Never Smoker  . Smokeless tobacco: Never Used  . Alcohol use Yes     Comment: rare  . Drug use: No  . Sexual activity: Yes   Other Topics Concern  . Not on file   Social History Narrative  . No narrative on file    FAMILY HISTORY: Family History  Problem Relation Age of Onset  . Cancer Maternal Uncle        liver cancer/environmental exposure  . Cancer Paternal Uncle        liver cancer/environmental exposure  . Epilepsy Mother   . Intracerebral hemorrhage Mother   . COPD Father   . Breast cancer Neg Hx     ALLERGIES:  has No Known Allergies.  MEDICATIONS:  Current Outpatient Prescriptions  Medication Sig Dispense Refill  . CLINDAGEL 1 % gel Apply 1 application topically daily as needed.     Marland Kitchen emollient  (BIAFINE) cream Apply topically as needed.    . hydrochlorothiazide (HYDRODIURIL) 25 MG tablet Take 25 mg by mouth daily. 12.5 mg to 25 mg dependent on bp medication    . letrozole (FEMARA) 2.5 MG tablet Take 1 tablet (2.5 mg total) by mouth daily. 90 tablet 2  . non-metallic deodorant (ALRA) MISC Apply 1 application topically daily as needed.    . valsartan (DIOVAN) 160 MG tablet Take 1 tablet (160 mg total) by mouth daily. 30 tablet 0   No current facility-administered medications for this visit.     REVIEW OF SYSTEMS:   Constitutional: Denies fevers, chills (+)  night sweats (+) fatigue Eyes: Denies blurriness of vision, double vision or watery eyes Ears, nose, mouth, throat, and face: Denies mucositis or sore throat Respiratory: Denies cough, dyspnea or wheezes Cardiovascular: Denies palpitation, chest discomfort or lower extremity swelling Gastrointestinal:  Denies nausea, heartburn or change in bowel habits Skin: Denies abnormal skin rashes Lymphatics: Denies new lymphadenopathy or easy bruising MSK: (+) joint aches Neurological: (+) Numbness in her hands in feet, more so in her feet. (+) hands swelling Behavioral/Psych: Mood is stable, no new changes  All other systems were reviewed with the patient and are negative.  PHYSICAL EXAMINATION: ECOG PERFORMANCE STATUS: 0  Vitals:   07/12/16 0901  BP: 127/64  Pulse: 70  Resp: 17  Temp: 98.4 F (36.9 C)   Filed Weights   07/12/16 0901  Weight: 275 lb 14.4 oz (125.1 kg)    GENERAL:alert, no distress and comfortable SKIN: skin color, texture, turgor are normal, no rashes or significant lesions, (+) alopecia EYES: normal, conjunctiva are pink and non-injected, sclera clear OROPHARYNX:no exudate, no erythema and lips, buccal mucosa, and tongue normal  NECK: supple, thyroid normal size, non-tender, without nodularity LYMPH:  no palpable lymphadenopathy in the cervical, axillary or inguinal LUNGS: clear to auscultation and  percussion with normal breathing effort HEART: regular rate & rhythm and no murmurs and no lower extremity edema ABDOMEN:abdomen soft, non-tender and normal bowel sounds Musculoskeletal:no cyanosis of digits and no clubbing  PSYCH: alert & oriented x 3 with fluent speech NEURO: no focal motor/sensory deficits BREAST: Breast inspection showed them to be symmetrical with no nipple discharge. (+) Surgical scar in the left breast has healed well. Diffuse erythema and skin pigmentation of the left breast and axilla, no skin breakdown. Palpation of the breasts and axilla revealed no obvious mass that I could appreciate. (+) small mass in outer quadrant closer to her incision, likely scar tissue or seroma.   LABORATORY DATA:  I have reviewed the data as listed CBC Latest Ref Rng & Units 07/12/2016 05/10/2016 03/29/2016  WBC 3.9 - 10.3 10e3/uL 6.4 5.0 7.5  Hemoglobin 11.6 - 15.9 g/dL 12.7 12.7 12.0  Hematocrit 34.8 - 46.6 % 37.8 38.1 36.5  Platelets 145 - 400 10e3/uL 261 262 330     CMP Latest Ref Rng & Units 07/12/2016 05/10/2016 03/29/2016  Glucose 70 - 140 mg/dl 104 95 103  BUN 7.0 - 26.0 mg/dL 15.7 14.8 13.3  Creatinine 0.6 - 1.1 mg/dL 0.8 0.7 0.7  Sodium 136 - 145 mEq/L 138 141 141  Potassium 3.5 - 5.1 mEq/L 3.6 3.9 3.8  Chloride 101 - 111 mmol/L - - -  CO2 22 - 29 mEq/L '26 22 23  ' Calcium 8.4 - 10.4 mg/dL 9.7 9.5 9.4  Total Protein 6.4 - 8.3 g/dL 6.9 6.6 6.1(L)  Total Bilirubin 0.20 - 1.20 mg/dL 0.45 0.38 0.59  Alkaline Phos 40 - 150 U/L 94 97 82  AST 5 - 34 U/L '16 17 22  ' ALT 0 - 55 U/L '19 18 22    ' PATHOLOGY REPORT Diagnosis 11/16/2015    1. Breast, lumpectomy, Left w/seed - INVASIVE GRADE 3 DUCTAL CARCINOMA, SPANNING 2.2 CM IN GREATEST DIMENSION. 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 - ASSOCIATED PROMINENT LYMPHOID RESPONSE. - INVASIVE DUCTAL CARCINOMA IS FOCALLY 0.1 TO 0.2 CM AWAY FROM MEDIAL MARGIN BUT MARGINAL SURFACE IS NEGATIVE FOR INVASIVE TUMOR. - DUCTAL  CARCINOMA IN SITU IS FOCALLY 0.1 CM AWAY FROM LATERAL MARGIN - OTHER MARGINS ARE NEGATIVE. - SEE ONCOLOGY TEMPLATE.  2. Lymph node, sentinel, biopsy, Left Axillary - ONE BENIGN LYMPH NODE WITH NO TUMOR SEEN (0/1). Microscopic Comment 1. BREAST, INVASIVE TUMOR, WITH LYMPH NODES PRESENT Specimen, including laterality and lymph node sampling (sentinel, non-sentinel): Left partial breast with left axillary sentinel lymph node. Procedure: Left breast lumpectomy with left axillary sentinel lymph node biopsy. Histologic type: Invasive ductal carcinoma. Grade: 3. Tubule formation: 3. Nuclear pleomorphism: 2. Mitotic: 3. Tumor size (gross measurement): 2.2 cm. Margins: Invasive, distance to closest margin: Invasive ductal carcinoma is focally 0.1 to 0.2 cm away from medial margin. In-situ, distance to closest margin: Ductal carcinoma in situ is focally 0.1 cm from lateral margin. If margin positive, focally or broadly: Marginal surface is not positive for tumor. Lymphovascular invasion: Definitive lymph/vascular invasion is not identified. Ductal carcinoma in situ: Yes. Grade: Intermediate grade. Extensive intraductal component: No. Lobular neoplasia: Not identified. Tumor focality: Unifocal. Extent of tumor: Tumor confined to breast parenchyma. Skin: Not received. Nipple: Not received. Skeletal muscle: Not received. Lymph nodes: Examined: 1 Sentinel. 0 Non-sentinel. 1 Total. Lymph nodes with metastasis: 0. Isolated tumor cells (< 0.2 mm): 0. Micrometastasis: (> 0.2 mm and < 2.0 mm): 0. Macrometastasis: (> 2.0 mm): 0. Extracapsular extension: Not applicable. Breast prognostic profile: Assessed on previous biopsy ([id number]): Estrogen receptor: 100%, positive. Progesterone receptor: 90%, positive. Her 2 neu: 2+ equivocal staining on immunohistochemical stain. FISH is pending on biopsy, per previous biospy report. Ki-67: 30%. Non-neoplastic breast: Fibrocystic changes. TNM:  pT2, pN0. (RH:kh 11-18-15)  Diagnosis 10/27/2015 Consult Slide , Left Breast Biopsy - INVASIVE DUCTAL CARCINOMA, SEE COMMENT. - DUCTAL CARCINOMA IN SITU. Microscopic Comment While grading is best performed on the resection specimen the carcinoma appears grade 3. E-cadherin is positive confirming a ductal phenotype. Beta-catenin is positive. p53 exhibits weak staining. ER: Positive, 100%, strong staining intensity. PR: Positive, 90%, strong staining intensity. Ki-67: 30% Her2 IHC: 2+ Equivocal, FISH (-), with average copy number of 2, and a ratio of 1.07.  Oncotype RS 23, intermediate risk, which predicts 10 year risk of distant recurrence 15% with tamoxifen  RADIOGRAPHIC STUDIES: I have personally reviewed the radiological images as listed and agreed with the findings in the report. No results found.  ASSESSMENT & PLAN: 56 y.o. Caucasian post menopause female, presented with screening discovered left breast cancer  1. 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 -We previously discussed her Oncotype DX test results. The recurrence score is 23, which is intermedia risk, it protects 10 year of distant recurrence risk of 15% with tamoxifen. The benefit of adjuvant chemotherapy intermediate risk group is uncertain, however given her young age, grade 3 disease, I think adjuvant chemo will probably reduce her risk of recurrence although the benefit is not very high. She is very concerned about cancer recurrence. After lengthy discussion, we decided to proceed with adjuvant chemotherapy -She has completed adjuvant chemotherapy -She has completed adjuvant radiation.  -she has started adjuvant Letrozole 2.5 mg, tolerating well overall  -Continue breast cancer  surveillance with yearly mammograms, self-exams, and clinic exam. -I encouraged her to eat healthy and exercise regularly -She will have another mammogram at the [facility] in August  -She will be seen by her surgeon once more and she will f/u with [place] in 3 months  2. Fatigue and peripheral neuropathy, grade  1 -Secondary to adjuvant chemotherapy -I previously encouraged her to gradually increase her activity levels, and try to exercise -She has mild neuropathy, we'll continue observation.  -I previously advised her to take VitB complex to help.  3. HTN -She is on antihypertensive medication, including HCTZ -Follow-up of his primary care physician  4. Bone Health -The patient reports never having a bone scan. -Bone scan at the breast center In August   5. Genetic Counseling -The patient inquired about genetic testing. -We previously discussed that given she does not have a family history of breast cancer, genetic counseling is not recommended. -We previously discussed it is likely insurance may not cover the testing and that an out of pocket cost would be ~$200. -She has a daughter and her daughter should start mammograms at age 64.  45. Obesity -I strongly encouraged her to have healthy diet, and exercise regularly, try to lose some weight. She is motivated, will try.   PLAN -Continue letrozole  -Lab and f/u in 3 months then every 4 months  -Bone density scan and mammogram at breast center in 09/2016   All questions were answered. The patient knows to call the clinic with any problems, questions or concerns.  I spent 25 minutes counseling the patient face to face. The total time spent in the appointment was 30 minutes and more than 50% was on counseling.     [name], MD 07/12/2016   This document serves as a record of services personally performed by [name], MD. It was created on her behalf by Joslyn Devon, a trained medical scribe. The creation of this record is based  on the scribe's personal observations and the provider's statements to them. This document has been checked and approved by the attending provider.

## 2016-08-01 NOTE — Telephone Encounter (Signed)
Open in error

## 2016-10-11 ENCOUNTER — Telehealth: Payer: Self-pay | Admitting: Hematology

## 2016-10-11 NOTE — Telephone Encounter (Signed)
sw pt to confirm r/s appt to 8/30 at 0800 per sch msg

## 2016-10-12 ENCOUNTER — Other Ambulatory Visit: Payer: BLUE CROSS/BLUE SHIELD

## 2016-10-12 ENCOUNTER — Ambulatory Visit: Payer: BLUE CROSS/BLUE SHIELD | Admitting: Hematology

## 2016-10-20 ENCOUNTER — Ambulatory Visit
Admission: RE | Admit: 2016-10-20 | Discharge: 2016-10-20 | Disposition: A | Discharge status: Home / Self Care | Payer: BLUE CROSS/BLUE SHIELD | Source: Ambulatory Visit | Attending: Hematology | Admitting: Hematology

## 2016-10-20 DIAGNOSIS — E2839 Other primary ovarian failure: Secondary | ICD-10-CM

## 2016-10-20 DIAGNOSIS — Z170 Estrogen receptor positive status [ER+]: Principal | ICD-10-CM

## 2016-10-20 DIAGNOSIS — C50412 Malignant neoplasm of upper-outer quadrant of left female breast: Secondary | ICD-10-CM

## 2016-10-20 HISTORY — DX: Personal history of irradiation: Z92.3

## 2016-10-20 HISTORY — DX: Personal history of antineoplastic chemotherapy: Z92.21

## 2016-10-24 ENCOUNTER — Telehealth: Payer: Self-pay | Admitting: *Deleted

## 2016-10-24 NOTE — Telephone Encounter (Signed)
Telephone call to patient to advise lab results as directed below. Left message for return call.

## 2016-10-24 NOTE — Telephone Encounter (Signed)
-----   Message from [name], MD sent at 10/23/2016  8:19 AM EDT ----- Please let patient knows the bone density scan result, she has osteopenia, mild, please encouraged her to take calcium and vitamin D. Thank you  [name]  10/23/2016

## 2016-10-25 NOTE — Progress Notes (Signed)
[place]  [phone]) [phone] [phone]  Clinic Follow up Note   Patient Care Team: System, Pcp Not In as PCP - General 10/26/2016   CHIEF COMPLAINTS:  Follow up left breast cancer  Oncology History   Breast cancer of upper-outer quadrant of left female breast [facility])   Staging form: Breast, AJCC 7th Edition   - Pathologic: Stage IIA (T2, N0, cM0) - Unsigned      Breast cancer of upper-outer quadrant of left female breast ([place])   10/20/2015 Mammogram    Screening mammogram showed new nodular density on the left breast, measuring about 2 cm. the well defined nodules bilaterally are otherwise stable.      10/27/2015 Initial Biopsy    Left breast 2:00 position mass biopsy showed infiltrating ductal carcinoma.       10/27/2015 Initial Diagnosis    Breast cancer of upper-outer quadrant of left female breast ([place])      10/27/2015 Receptors her2    ER 100% positive, strong staining, PR 90% positive, strong staining, HER-2 IHC 2+, FISH negative.      11/16/2015 Surgery    Left breast lumpectomy and sentinel lymph node biopsy.      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 Margins were negative. One sentinel lymph node was negative. Lymphovascular invasion was negative.      11/16/2015 Oncotype testing    RS 23, intermediate risk, which predicts 10 year risk of distant recurrence 15% with tamoxifen      12/23/2015 - 03/01/2016 Adjuvant Chemotherapy    Docetaxel and Cytoxan, every 3 weeks, with Neulasta on day 2, for 4 cycles      03/27/2016 - 05/10/2016 Radiation Therapy    Adjuvant breast radiation 03/27/16 - 05/10/16 60.4 Gy in 33 fractions to the left breast by Dr. Tammi Klippel.      05/31/2016 -  Anti-estrogen oral therapy    Letrozole 2.5 mg daily        HISTORY OF PRESENTING ILLNESS:  Nicole Schmitt 56 y.o. female is here because of Her recently resected  left breast cancer. She presents to the clinic by herself. She was referred by her surgeon Dr. Excell Seltzer.  She lives just outside of [place] but works as an Therapist, sports on the Advertising account planner at [employer]. Her breast cancer was discovered by screening mammogram, and her initial workup was done in [place].She recently underwent a screening mamogram which revealed a possible mass in the left breast. Subsequent imaging included diagnostic mamogram showing a persistent area of asymmetric density in the left breast and ultrasound showing a 1.8 x 0.7 cm lobulated mass at the 2 o'clock position in the left breast 6 cm from the nipple. An ultrasound guided breast biopsy was performed on October 27, 2015 with pathology revealing invasive ductal carcinoma of the breast. She was referred to (surgeon Dr. Excell Seltzer, and underwent left breast lumpectomy and sentinel lymph node biopsy on 11/16/2015. The surgical pathology showed invasive grade 3 ductal carcinoma, 2.2 cm, one lymph node was negative.   She has been recovering well from her surgery. The pain to this surgical incision is minimal, she does not take much pain medication. No limited range of movement of left shoulder. Her appetite and energy level has been back to normal, she denies any other symptoms.  Her husband was diagnosed with metastatic melanoma in early 2016, has been receiving immunotherapy at [facility]. Her father was also  recently diagnosed with CLL, but does not quite treatment. She has been under lot of stress in the past few years.  CURRENT THERAPY: Letrozole 2.5 mg, 1 tablet daily, started 05/31/16  INTERIM HISTORY:  Mrs Nicole Schmitt returns for follow-up. She presents to the clinic today reporting she has issues with joint pain which wakes her up aching at night. They happen in both arm, some numbness occurs in the her feet but its much worse in her arms. It will get better in the day. She takes ibuprofen to help. The aches have plateau.  Letrozole is not  horrible it is her new normal.  She works for [employer] and drives home to [place]. So the long drives make her more tired and fatigued.     MEDICAL HISTORY:  Past Medical History:  Diagnosis Date  . Breast cancer ([place])   . Cancer ([place]) 1990   cervical   . Hypertension   . Personal history of chemotherapy    2017  . Personal history of radiation therapy    2018  . Seizures ([place])    > 20 years following fall from horse    SURGICAL HISTORY: Past Surgical History:  Procedure Laterality Date  . BREAST LUMPECTOMY Left 11/16/2015   Malignant  . BREAST LUMPECTOMY WITH RADIOACTIVE SEED AND SENTINEL LYMPH NODE BIOPSY Left 11/16/2015   Procedure: BREAST LUMPECTOMY WITH RADIOACTIVE SEED AND SENTINEL LYMPH NODE BIOPSY;  Surgeon: Excell Seltzer, MD;  Location: [place];  Service: General;  Laterality: Left;  . CESAREAN SECTION    . COLONOSCOPY    . WISDOM TOOTH EXTRACTION      SOCIAL HISTORY: Social History   Social History  . Marital status: Married    Spouse name: N/A  . Number of children: N/A  . Years of education: N/A   Occupational History  . Not on file.   Social History Main Topics  . Smoking status: Never Smoker  . Smokeless tobacco: Never Used  . Alcohol use Yes     Comment: rare  . Drug use: No  . Sexual activity: Yes   Other Topics Concern  . Not on file   Social History Narrative  . No narrative on file    FAMILY HISTORY: Family History  Problem Relation Age of Onset  . Cancer Maternal Uncle        liver cancer/environmental exposure  . Cancer Paternal Uncle        liver cancer/environmental exposure  . Epilepsy Mother   . Intracerebral hemorrhage Mother   . COPD Father   . Breast cancer Neg Hx     ALLERGIES:  has No Known Allergies.  MEDICATIONS:  Current Outpatient Prescriptions  Medication Sig Dispense Refill  . CLINDAGEL 1 % gel Apply 1 application topically daily as needed.     . hydrochlorothiazide (HYDRODIURIL) 25 MG  tablet Take 25 mg by mouth daily. 12.5 mg to 25 mg dependent on bp medication    . letrozole (FEMARA) 2.5 MG tablet Take 1 tablet (2.5 mg total) by mouth daily. 90 tablet 2  . non-metallic deodorant (ALRA) MISC Apply 1 application topically daily as needed.    . valsartan (DIOVAN) 160 MG tablet Take 1 tablet (160 mg total) by mouth daily. 30 tablet 0   No current facility-administered medications for this visit.     REVIEW OF SYSTEMS:   Constitutional: Denies fevers, chills  (+) fatigue Eyes: Denies blurriness of vision, double vision or watery eyes Ears, nose, mouth, throat, and  face: Denies mucositis or sore throat Respiratory: Denies cough, dyspnea or wheezes Cardiovascular: Denies palpitation, chest discomfort or lower extremity swelling Gastrointestinal:  Denies nausea, heartburn or change in bowel habits Skin: Denies abnormal skin rashes Lymphatics: Denies new lymphadenopathy or easy bruising Neurological: (+) Numbness in her hands > feet MSK: (+) Aches in her arms/wrists at night  Behavioral/Psych: Mood is stable, no new changes  All other systems were reviewed with the patient and are negative.  PHYSICAL EXAMINATION: ECOG PERFORMANCE STATUS: 0  Vitals:   10/26/16 0843  BP: 118/66  Pulse: (!) 55  Resp: 18  Temp: (!) 97.3 F (36.3 C)  SpO2: 100%   Filed Weights   10/26/16 0843  Weight: 278 lb 1.6 oz (126.1 kg)    GENERAL:alert, no distress and comfortable SKIN: skin color, texture, turgor are normal, no rashes or significant lesions EYES: normal, conjunctiva are pink and non-injected, sclera clear OROPHARYNX:no exudate, no erythema and lips, buccal mucosa, and tongue normal  NECK: supple, thyroid normal size, non-tender, without nodularity LYMPH:  no palpable lymphadenopathy in the cervical, axillary or inguinal LUNGS: clear to auscultation and percussion with normal breathing effort HEART: regular rate & rhythm and no murmurs and no lower extremity  edema ABDOMEN:abdomen soft, non-tender and normal bowel sounds Musculoskeletal:no cyanosis of digits and no clubbing  PSYCH: alert & oriented x 3 with fluent speech NEURO: no focal motor/sensory deficits BREAST: Breast inspection showed them to be symmetrical with no nipple discharge. (+) Surgical scar in the left breast has healed well. Diffuse skin pigmentation of the left breast and axilla,  No ulcers. Palpation of the breasts and axilla revealed no obvious mass that I could appreciate. (+) small mass in outer quadrant closer to her incision, likely scar tissue or seroma.   LABORATORY DATA:  I have reviewed the data as listed CBC Latest Ref Rng & Units 10/26/2016 07/12/2016 05/10/2016  WBC 3.9 - 10.3 10e3/uL 6.1 6.4 5.0  Hemoglobin 11.6 - 15.9 g/dL 13.5 12.7 12.7  Hematocrit 34.8 - 46.6 % 40.3 37.8 38.1  Platelets 145 - 400 10e3/uL 247 261 262     CMP Latest Ref Rng & Units 10/26/2016 07/12/2016 05/10/2016  Glucose 70 - 140 mg/dl 95 104 95  BUN 7.0 - 26.0 mg/dL 14.9 15.7 14.8  Creatinine 0.6 - 1.1 mg/dL 0.7 0.8 0.7  Sodium 136 - 145 mEq/L 142 138 141  Potassium 3.5 - 5.1 mEq/L 4.3 3.6 3.9  Chloride 101 - 111 mmol/L - - -  CO2 22 - 29 mEq/L _0 Calcium 8.4 - 10.4 mg/dL 9.9 9.7 9.5  Total Protein 6.4 - 8.3 g/dL 7.0 6.9 6.6  Total Bilirubin 0.20 - 1.20 mg/dL 0.44 0.45 0.38  Alkaline Phos 40 - 150 U/L 113 94 97  AST 5 - 34 U/L _1 ALT 0 - 55 U/L _2 PATHOLOGY REPORT Diagnosis 11/16/2015    1. Breast, lumpectomy, Left w/seed - INVASIVE GRADE 3 DUCTAL CARCINOMA, SPANNING 2.2 CM IN GREATEST DIMENSION. 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 - ASSOCIATED PROMINENT LYMPHOID RESPONSE. - INVASIVE DUCTAL CARCINOMA IS FOCALLY 0.1 TO 0.2 CM AWAY FROM MEDIAL MARGIN BUT MARGINAL SURFACE IS NEGATIVE FOR INVASIVE TUMOR. - DUCTAL CARCINOMA IN SITU IS FOCALLY 0.1 CM AWAY FROM LATERAL MARGIN - OTHER MARGINS ARE NEGATIVE. - SEE ONCOLOGY TEMPLATE. 2. Lymph node,  sentinel, biopsy, Left Axillary - ONE BENIGN LYMPH NODE WITH NO TUMOR SEEN (0/1). Microscopic Comment 1. BREAST,  INVASIVE TUMOR, WITH LYMPH NODES PRESENT Specimen, including laterality and lymph node sampling (sentinel, non-sentinel): Left partial breast with left axillary sentinel lymph node. Procedure: Left breast lumpectomy with left axillary sentinel lymph node biopsy. Histologic type: Invasive ductal carcinoma. Grade: 3. Tubule formation: 3. Nuclear pleomorphism: 2. Mitotic: 3. Tumor size (gross measurement): 2.2 cm. Margins: Invasive, distance to closest margin: Invasive ductal carcinoma is focally 0.1 to 0.2 cm away from medial margin. In-situ, distance to closest margin: Ductal carcinoma in situ is focally 0.1 cm from lateral margin. If margin positive, focally or broadly: Marginal surface is not positive for tumor. Lymphovascular invasion: Definitive lymph/vascular invasion is not identified. Ductal carcinoma in situ: Yes. Grade: Intermediate grade. Extensive intraductal component: No. Lobular neoplasia: Not identified. Tumor focality: Unifocal. Extent of tumor: Tumor confined to breast parenchyma. Skin: Not received. Nipple: Not received. Skeletal muscle: Not received. Lymph nodes: Examined: 1 Sentinel. 0 Non-sentinel. 1 Total. Lymph nodes with metastasis: 0. Isolated tumor cells (< 0.2 mm): 0. Micrometastasis: (> 0.2 mm and < 2.0 mm): 0. Macrometastasis: (> 2.0 mm): 0. Extracapsular extension: Not applicable. Breast prognostic profile: Assessed on previous biopsy ([id number]): Estrogen receptor: 100%, positive. Progesterone receptor: 90%, positive. Her 2 neu: 2+ equivocal staining on immunohistochemical stain. FISH is pending on biopsy, per previous biospy report. Ki-67: 30%. Non-neoplastic breast: Fibrocystic changes. TNM: pT2, pN0. (RH:kh 11-18-15)  Diagnosis 10/27/2015 Consult Slide , Left Breast Biopsy - INVASIVE DUCTAL CARCINOMA, SEE COMMENT. -  DUCTAL CARCINOMA IN SITU. Microscopic Comment While grading is best performed on the resection specimen the carcinoma appears grade 3. E-cadherin is positive confirming a ductal phenotype. Beta-catenin is positive. p53 exhibits weak staining. ER: Positive, 100%, strong staining intensity. PR: Positive, 90%, strong staining intensity. Ki-67: 30% Her2 IHC: 2+ Equivocal, FISH (-), with average copy number of 2, and a ratio of 1.07.  Oncotype RS 23, intermediate risk, which predicts 10 year risk of distant recurrence 15% with tamoxifen  RADIOGRAPHIC STUDIES: I have personally reviewed the radiological images as listed and agreed with the findings in the report. Dg Bone Density  Result Date: 10/20/2016 EXAM: DUAL X-RAY ABSORPTIOMETRY (DXA) FOR BONE MINERAL DENSITY IMPRESSION: Referring Physician:  [name] PATIENT: Name: Flornce, Record Patient ID: [id number] Birth Date: August 22, 1960 Height:     64.2 in. Sex: Female Measured: 10/20/2016 Weight:     269.0 lbs. Indications: Breast Cancer History, Caucasian, Estrogen Deficient, Family History of Osteoporosis, Height Loss (781.91), High risk medication use, Hx of tobacco use, Letrozole, Postmenopausal Fractures:  None Treatments: Calcium (E943.0), Vitamin D (E933.5) ASSESSMENT: The BMD measured at Femur Neck Left is 0.888 g/cm2 with a T-score of -1.1. This patient is considered osteopenic according to [facility]) criteria. Site Region Measured Date Measured Age YA BMD Significant CHANGE T-score DualFemur Neck Left 10/20/2016    54.5         -1.1    0.888 g/cm2 AP Spine  L1-L4     10[age]20/2016    54.7         0.5     1.251 g/cm2 World Health Organization [facility]) criteria for post-menopausal, Caucasian Women: Normal       T-score at or above -1 SD Osteopenia   T-score between -1 and -2.5 SD Osteoporosis T-score at or below -2.5 SD RECOMMENDATION: [place] recommends that FDA-approved medical therapies be considered in  postmenopausal women and men age 75 or older with a: 1. Hip or vertebral (clinical or morphometric) fracture. 2. T-score of <-2.5 at the  spine or hip. 3. Ten-year fracture probability by FRAX of 3% or greater for hip fracture or 20% or greater for major osteoporotic fracture. All treatment decisions require clinical judgment and consideration of individual patient factors, including patient preferences, co-morbidities, previous drug use, risk factors not captured in the FRAX model (e.g. falls, vitamin D deficiency, increased bone turnover, interval significant decline in bone density) and possible under - or over-estimation of fracture risk by FRAX. All patients should ensure an adequate intake of dietary calcium (1200 mg/d) and vitamin D (800 IU daily) unless contraindicated. FOLLOW-UP: People with diagnosed cases of osteoporosis or at high risk for fracture should have regular bone mineral density tests. For patients eligible for Medicare, routine testing is allowed once every 2 years. The testing frequency can be increased to one year for patients who have rapidly progressing disease, those who are receiving or discontinuing medical therapy to restore bone mass, or have additional risk factors. I have reviewed this report, and agree with the above findings. [facility] FRAX* 10-year Probability of Fracture Based on femoral neck BMD: DualFemur (Left) Major Osteoporotic Fracture: 4.8% Hip Fracture:                0.2% Population:                  [place] (Caucasian) Risk Factors:                None *FRAX is a Materials engineer of the [employer] of [employer] for Metabolic Bone Disease, a World Pharmacologist (WHO) [facility]. ASSESSMENT: The probability of a major osteoporotic fracture is 4.8 % within the next ten years. The probability of a hip fracture is 0.2 % within the next ten years. Electronically Signed   By: Lovey Newcomer M.D.   On: 10/20/2016 09:56   Mm Diag Breast Tomo  Bilateral  Result Date: 10/20/2016 CLINICAL DATA:  Left lumpectomy.  Annual mammography. EXAM: 2D DIGITAL DIAGNOSTIC BILATERAL MAMMOGRAM WITH CAD AND ADJUNCT TOMO COMPARISON:  Previous exam(s). ACR Breast Density Category b: There are scattered areas of fibroglandular density. FINDINGS: The left lumpectomy site is stable. No suspicious masses, calcifications, or distortion. Mammographic images were processed with CAD. IMPRESSION: No mammographic evidence of malignancy. RECOMMENDATION: Annual diagnostic mammography. I have discussed the findings and recommendations with the patient. Results were also provided in writing at the conclusion of the visit. If applicable, a reminder letter will be sent to the patient regarding the next appointment. BI-RADS CATEGORY  2: Benign. Electronically Signed   By: Dorise Bullion III M.D   On: 10/20/2016 10:13    ASSESSMENT & PLAN: 56 y.o. Caucasian post menopause female, presented with screening discovered left breast cancer  1. 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 -We previously discussed her Oncotype DX test results. The recurrence score is 23, which is intermedia risk, it protects 10 year of distant recurrence risk of 15% with tamoxifen. The benefit of adjuvant chemotherapy intermediate risk group is uncertain, however given her young age, grade 3 disease, I think adjuvant chemo will probably reduce her risk of recurrence although the benefit is not very high. She is very concerned about cancer recurrence. After lengthy discussion, we decided to proceed with adjuvant chemotherapy -She has completed adjuvant chemotherapy -She has  completed adjuvant radiation.  -she has started adjuvant Letrozole 2.5 mg, tolerating well  overall overall except mild arthralgia and stiffness -Continue breast cancer surveillance with yearly mammograms, self-exams, and clinic exam. -I encouraged her to eat healthy and exercise regularly -She is clinically doing well. Her mammogram in 09/2016 was normal, CBC and CMP are overall within normal limits, Breast Exam normal. She shows no sign of recurrence.  -she will f/u with [place] in 4 months  2. Fatigue and peripheral neuropathy, grade 1 -Secondary to adjuvant chemotherapy -I previously encouraged her to gradually increase her activity levels, and try to exercise -She has mild neuropathy, we'll continue observation.  -I previously advised her to take VitB complex to help. -Her neuropathy in her feet has improved but still present in hands. Will continue to monitor.   3. HTN -She is on antihypertensive medication, including HCTZ -Follow-up of his primary care physician  4. Osteopenia -The patient reports never having a bone scan. -Her Bone density from 10/20/16 shows she is slightly osteopenic, her T-score -1.1 -I suggest she takes calcium, vitamin D and exercise  5. Genetic Counseling -The patient inquired about genetic testing. -We previously discussed that given she does not have a family history of breast cancer, genetic counseling is not recommended. -We previously discussed it is likely insurance may not cover the testing and that an out of pocket cost would be ~$200. -She has a daughter and her daughter should start mammograms at age 59.  23. Obesity -I strongly encouraged her to have healthy diet, and exercise regularly, try to lose some weight. She is motivated, will try.  -I discussed how letrozole can cause her to gain weight. I encourage her to diet and exercise and to watch her weight.   7. Arthralgia and muscle achiness in both arms and wrists -likely secondary to Letrozole, I discussed the side effects to her -I suggest she exercises and stretches as well as take  over-the-counter joint medication and ibuprofen.  -If the aches worsen I can switch her to exemestane.    PLAN -Continue letrozole  -Lab and f/u in 4 months     All questions were answered. The patient knows to call the clinic with any problems, questions or concerns.  I spent 25 minutes counseling the patient face to face. The total time spent in the appointment was 30 minutes and more than 50% was on counseling.     [name], MD 10/26/2016   This document serves as a record of services personally performed by [name], MD. It was created on her behalf by Joslyn Devon, a trained medical scribe. The creation of this record is based on the scribe's personal observations and the provider's statements to them. This document has been checked and approved by the attending provider.

## 2016-10-26 ENCOUNTER — Ambulatory Visit (HOSPITAL_BASED_OUTPATIENT_CLINIC_OR_DEPARTMENT_OTHER): Payer: BLUE CROSS/BLUE SHIELD | Admitting: Hematology

## 2016-10-26 ENCOUNTER — Telehealth: Payer: Self-pay | Admitting: Hematology

## 2016-10-26 ENCOUNTER — Other Ambulatory Visit (HOSPITAL_BASED_OUTPATIENT_CLINIC_OR_DEPARTMENT_OTHER): Payer: BLUE CROSS/BLUE SHIELD

## 2016-10-26 ENCOUNTER — Encounter: Payer: Self-pay | Admitting: Hematology

## 2016-10-26 VITALS — BP 118/66 | HR 55 | Temp 97.3°F | Resp 18 | Ht 65.0 in | Wt 278.1 lb

## 2016-10-26 DIAGNOSIS — R5383 Other fatigue: Secondary | ICD-10-CM | POA: Diagnosis not present

## 2016-10-26 DIAGNOSIS — Z170 Estrogen receptor positive status [ER+]: Secondary | ICD-10-CM

## 2016-10-26 DIAGNOSIS — Z79811 Long term (current) use of aromatase inhibitors: Secondary | ICD-10-CM

## 2016-10-26 DIAGNOSIS — I10 Essential (primary) hypertension: Secondary | ICD-10-CM

## 2016-10-26 DIAGNOSIS — G620 Drug-induced polyneuropathy: Secondary | ICD-10-CM | POA: Diagnosis not present

## 2016-10-26 DIAGNOSIS — C50412 Malignant neoplasm of upper-outer quadrant of left female breast: Secondary | ICD-10-CM | POA: Diagnosis not present

## 2016-10-26 DIAGNOSIS — M8580 Other specified disorders of bone density and structure, unspecified site: Secondary | ICD-10-CM

## 2016-10-26 LAB — CBC WITH DIFFERENTIAL/PLATELET
BASO%: 0.9 % (ref 0.0–2.0)
Basophils Absolute: 0.1 10*3/uL (ref 0.0–0.1)
EOS%: 1.5 % (ref 0.0–7.0)
Eosinophils Absolute: 0.1 10*3/uL (ref 0.0–0.5)
HCT: 40.3 % (ref 34.8–46.6)
HGB: 13.5 g/dL (ref 11.6–15.9)
LYMPH%: 33.5 % (ref 14.0–49.7)
MCH: 29.3 pg (ref 25.1–34.0)
MCHC: 33.6 g/dL (ref 31.5–36.0)
MCV: 87.1 fL (ref 79.5–101.0)
MONO#: 0.5 10*3/uL (ref 0.1–0.9)
MONO%: 8.5 % (ref 0.0–14.0)
NEUT#: 3.4 10*3/uL (ref 1.5–6.5)
NEUT%: 55.6 % (ref 38.4–76.8)
PLATELETS: 247 10*3/uL (ref 145–400)
RBC: 4.63 10*6/uL (ref 3.70–5.45)
RDW: 13.9 % (ref 11.2–14.5)
WBC: 6.1 10*3/uL (ref 3.9–10.3)
lymph#: 2.1 10*3/uL (ref 0.9–3.3)

## 2016-10-26 LAB — COMPREHENSIVE METABOLIC PANEL
ALK PHOS: 113 U/L (ref 40–150)
ALT: 15 U/L (ref 0–55)
AST: 15 U/L (ref 5–34)
Albumin: 3.5 g/dL (ref 3.5–5.0)
Anion Gap: 8 mEq/L (ref 3–11)
BILIRUBIN TOTAL: 0.44 mg/dL (ref 0.20–1.20)
BUN: 14.9 mg/dL (ref 7.0–26.0)
CHLORIDE: 108 meq/L (ref 98–109)
CO2: 25 mEq/L (ref 22–29)
CREATININE: 0.7 mg/dL (ref 0.6–1.1)
Calcium: 9.9 mg/dL (ref 8.4–10.4)
EGFR: >90 mL/min/{1.73_m2} (ref >90)
Glucose: 95 mg/dl (ref 70–140)
POTASSIUM: 4.3 meq/L (ref 3.5–5.1)
SODIUM: 142 meq/L (ref 136–145)
Total Protein: 7 g/dL (ref 6.4–8.3)

## 2016-10-26 NOTE — Telephone Encounter (Signed)
Gave patient avs and calendar with appts.  °

## 2017-02-15 ENCOUNTER — Telehealth: Payer: Self-pay | Admitting: *Deleted

## 2017-02-15 ENCOUNTER — Other Ambulatory Visit: Payer: BLUE CROSS/BLUE SHIELD

## 2017-02-15 ENCOUNTER — Ambulatory Visit: Payer: BLUE CROSS/BLUE SHIELD | Admitting: Hematology

## 2017-02-15 NOTE — Telephone Encounter (Signed)
Called pt due to missed appt today.  She didn't realize she had appt today.  She reports doing OK.  Will send message to scheduler to r/s.

## 2017-02-17 ENCOUNTER — Telehealth: Payer: Self-pay | Admitting: Hematology

## 2017-02-17 NOTE — Telephone Encounter (Signed)
Spoke with patient and rescheduled appt from 12/20 that was missed

## 2017-03-04 NOTE — Progress Notes (Signed)
[place]  [phone] Fax:([phone]) [phone]  Clinic Follow up Note   Patient Care Team: System, Pcp Not In as PCP - General 03/05/2017   CHIEF COMPLAINTS:  Follow up left breast cancer  Oncology History   Breast cancer of upper-outer quadrant of left female breast [facility])   Staging form: Breast, AJCC 7th Edition   - Pathologic: Stage IIA (T2, N0, cM0) - Unsigned      Breast cancer of upper-outer quadrant of left female breast ([place])   10/20/2015 Mammogram    Screening mammogram showed new nodular density on the left breast, measuring about 2 cm. the well defined nodules bilaterally are otherwise stable.      10/27/2015 Initial Biopsy    Left breast 2:00 position mass biopsy showed infiltrating ductal carcinoma.       10/27/2015 Initial Diagnosis    Breast cancer of upper-outer quadrant of left female breast ([place])      10/27/2015 Receptors her2    ER 100% positive, strong staining, PR 90% positive, strong staining, HER-2 IHC 2+, FISH negative.      11/16/2015 Surgery    Left breast lumpectomy and sentinel lymph node biopsy.      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 Margins were negative. One sentinel lymph node was negative. Lymphovascular invasion was negative.      11/16/2015 Oncotype testing    RS 23, intermediate risk, which predicts 10 year risk of distant recurrence 15% with tamoxifen      12/23/2015 - 03/01/2016 Adjuvant Chemotherapy    Docetaxel and Cytoxan, every 3 weeks, with Neulasta on day 2, for 4 cycles      03/27/2016 - 05/10/2016 Radiation Therapy    Adjuvant breast radiation 03/27/16 - 05/10/16 60.4 Gy in 33 fractions to the left breast by Dr. Tammi Klippel.      05/31/2016 -  Anti-estrogen oral therapy    Letrozole 2.5 mg daily        HISTORY OF PRESENTING ILLNESS:  Nicole Schmitt 57 y.o. female is here because of Her recently resected  left breast cancer. She presents to the clinic by herself. She was referred by her surgeon Dr. Excell Seltzer.  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 Her breast cancer was discovered by screening mammogram, and her initial workup was done in [place].She recently underwent a screening mamogram which revealed a possible mass in the left breast. Subsequent imaging included diagnostic mamogram showing a persistent area of asymmetric density in the left breast and ultrasound showing a 1.8 x 0.7 cm lobulated mass at the 2 o'clock position in the left breast 6 cm from the nipple. An ultrasound guided breast biopsy was performed on October 27, 2015 with pathology revealing invasive ductal carcinoma of the breast. She was referred to (surgeon Dr. Excell Seltzer, and underwent left breast lumpectomy and sentinel lymph node biopsy on 11/16/2015. The surgical pathology showed invasive grade 3 ductal carcinoma, 2.2 cm, one lymph node was negative.   She has been recovering well from her surgery. The pain to this surgical incision is minimal, she does not take much pain medication. No limited range of movement of left shoulder. Her appetite and energy level has been back to normal, she denies any other symptoms.  Her husband was diagnosed with metastatic melanoma in early 2016, has been receiving immunotherapy at [facility]. Her father was also  recently diagnosed with CLL, but does not quite treatment. She has been under lot of stress in the past few years.  CURRENT THERAPY: Letrozole 2.5 mg, 1 tablet daily, started 05/31/16  INTERIM HISTORY:  Nicole Schmitt returns for follow-up. She reports that she has been doing relatively well in the interim. She has been tolerating Letrezole alright, however, she notes that she has developed aching pains into her bilateral arms at night which will wake her up from sleep. She has been wearing wrist braces at night which does not help much and after  waking up keeping her arms extended will also improve her pain. She also notes that in the mornings she will wake up with stiffness into her fingers. This will improve throughout the day as she is working and active. She also reports that her pain and stiffness is worse in the cold. Overall her symptoms have been manageable for now.  On review of systems, pt denies fever, chills, rash, mouth sores, weight loss, decreased appetite, urinary complaints. Denies hot flashes. Pt denies abdominal pain, nausea, vomiting. Pertinent positives are listed and detailed within the above HPI.    MEDICAL HISTORY:  Past Medical History:  Diagnosis Date  . Breast cancer ([place])   . Cancer ([place]) 1990   cervical   . Hypertension   . Personal history of chemotherapy    2017  . Personal history of radiation therapy    2018  . Seizures ([place])    > 20 years following fall from horse    SURGICAL HISTORY: Past Surgical History:  Procedure Laterality Date  . BREAST LUMPECTOMY Left 11/16/2015   Malignant  . BREAST LUMPECTOMY WITH RADIOACTIVE SEED AND SENTINEL LYMPH NODE BIOPSY Left 11/16/2015   Procedure: BREAST LUMPECTOMY WITH RADIOACTIVE SEED AND SENTINEL LYMPH NODE BIOPSY;  Surgeon: Excell Seltzer, MD;  Location: [place];  Service: General;  Laterality: Left;  . CESAREAN SECTION    . COLONOSCOPY    . WISDOM TOOTH EXTRACTION      SOCIAL HISTORY: Social History   Socioeconomic History  . Marital status: Married    Spouse name: Not on file  . Number of children: Not on file  . Years of education: Not on file  . Highest education level: Not on file  Social Needs  . Financial resource strain: Not on file  . Food insecurity - worry: Not on file  . Food insecurity - inability: Not on file  . Transportation needs - medical: Not on file  . Transportation needs - non-medical: Not on file  Occupational History  . Not on file  Tobacco Use  . Smoking status: Never Smoker  . Smokeless  tobacco: Never Used  Substance and Sexual Activity  . Alcohol use: Yes    Comment: rare  . Drug use: No  . Sexual activity: Yes  Other Topics Concern  . Not on file  Social History Narrative  . Not on file    FAMILY HISTORY: Family History  Problem Relation Age of Onset  . Cancer Maternal Uncle        liver cancer/environmental exposure  . Cancer Paternal Uncle        liver cancer/environmental exposure  . Epilepsy Mother   . Intracerebral hemorrhage Mother   . COPD Father   . Breast cancer Neg Hx     ALLERGIES:  has No Known Allergies.  MEDICATIONS:  Current Outpatient Medications  Medication Sig Dispense Refill  . CLINDAGEL 1 % gel Apply 1 application  topically daily as needed.     . hydrochlorothiazide (HYDRODIURIL) 25 MG tablet Take 25 mg by mouth daily. 12.5 mg to 25 mg dependent on bp medication    . letrozole (FEMARA) 2.5 MG tablet Take 1 tablet (2.5 mg total) by mouth daily. 90 tablet 2  . non-metallic deodorant (ALRA) MISC Apply 1 application topically daily as needed.    . valsartan (DIOVAN) 160 MG tablet Take 1 tablet (160 mg total) by mouth daily. 30 tablet 0   No current facility-administered medications for this visit.     REVIEW OF SYSTEMS:   Constitutional: Denies fevers, chills  (+) fatigue Eyes: Denies blurriness of vision, double vision or watery eyes Ears, nose, mouth, throat, and face: Denies mucositis or sore throat Respiratory: Denies cough, dyspnea or wheezes Cardiovascular: Denies palpitation, chest discomfort or lower extremity swelling Gastrointestinal:  Denies nausea, heartburn or change in bowel habits Skin: Denies abnormal skin rashes Lymphatics: Denies new lymphadenopathy or easy bruising Neurological: (+) Numbness in her hands > feet MSK: (+) Aches in her arms/wrists at night, stiffness in the mornings of the fingers Behavioral/Psych: Mood is stable, no new changes  All other systems were reviewed with the patient and are  negative.  PHYSICAL EXAMINATION: ECOG PERFORMANCE STATUS: 1  Vitals:   03/05/17 1153  BP: (!) 114/55  Pulse: 70  Resp: 20  Temp: 97.8 F (36.6 C)  SpO2: 100%   Filed Weights   03/05/17 1153  Weight: 283 lb 3.2 oz (128.5 kg)    GENERAL:alert, no distress and comfortable SKIN: skin color, texture, turgor are normal, no rashes or significant lesions EYES: normal, conjunctiva are pink and non-injected, sclera clear OROPHARYNX:no exudate, no erythema and lips, buccal mucosa, and tongue normal  NECK: supple, thyroid normal size, non-tender, without nodularity LYMPH:  no palpable lymphadenopathy in the cervical, axillary or inguinal LUNGS: clear to auscultation and percussion with normal breathing effort HEART: regular rate & rhythm and no murmurs and no lower extremity edema ABDOMEN:abdomen soft, non-tender and normal bowel sounds Musculoskeletal:no cyanosis of digits and no clubbing  PSYCH: alert & oriented x 3 with fluent speech NEURO: no focal motor/sensory deficits BREAST:Breast inspection showed them to be symmetrical with no nipple discharge. (+) Surgical scar in the left breast has healed well. Diffuse skin pigmentation of the left breast and axilla,  No ulcers. Palpation of the breasts and axilla revealed no obvious mass that I could appreciate. (+) small mass in outer quadrant closer to her incision, likely scar tissue or seroma.   LABORATORY DATA:  I have reviewed the data as listed CBC Latest Ref Rng & Units 03/05/2017 10/26/2016 07/12/2016  WBC 3.9 - 10.3 K/uL 8.2 6.1 6.4  Hemoglobin 11.6 - 15.9 g/dL 12.8 13.5 12.7  Hematocrit 34.8 - 46.6 % 39.8 40.3 37.8  Platelets 145 - 400 K/uL 281 247 261     CMP Latest Ref Rng & Units 03/05/2017 10/26/2016 07/12/2016  Glucose 70 - 140 mg/dL 92 95 104  BUN 7 - 26 mg/dL 15 14.9 15.7  Creatinine 0.60 - 1.10 mg/dL 0.78 0.7 0.8  Sodium 136 - 145 mmol/L 140 142 138  Potassium 3.3 - 4.7 mmol/L 4.5 4.3 3.6  Chloride 98 - 109 mmol/L 106 - -   CO2 22 - 29 mmol/L _0 Calcium 8.4 - 10.4 mg/dL 9.5 9.9 9.7  Total Protein 6.4 - 8.3 g/dL 7.2 7.0 6.9  Total Bilirubin 0.2 - 1.2 mg/dL 0.3 0.44 0.45  Alkaline Phos 40 -  150 U/L 119 113 94  AST 5 - 34 U/L _0 ALT 0 - 55 U/L _1 PATHOLOGY REPORT Diagnosis 11/16/2015    1. Breast, lumpectomy, Left w/seed - INVASIVE GRADE 3 DUCTAL CARCINOMA, SPANNING 2.2 CM IN GREATEST DIMENSION. 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 - ASSOCIATED PROMINENT LYMPHOID RESPONSE. - INVASIVE DUCTAL CARCINOMA IS FOCALLY 0.1 TO 0.2 CM AWAY FROM MEDIAL MARGIN BUT MARGINAL SURFACE IS NEGATIVE FOR INVASIVE TUMOR. - DUCTAL CARCINOMA IN SITU IS FOCALLY 0.1 CM AWAY FROM LATERAL MARGIN - OTHER MARGINS ARE NEGATIVE. - SEE ONCOLOGY TEMPLATE. 2. Lymph node, sentinel, biopsy, Left Axillary - ONE BENIGN LYMPH NODE WITH NO TUMOR SEEN (0/1). Microscopic Comment 1. BREAST, INVASIVE TUMOR, WITH LYMPH NODES PRESENT Specimen, including laterality and lymph node sampling (sentinel, non-sentinel): Left partial breast with left axillary sentinel lymph node. Procedure: Left breast lumpectomy with left axillary sentinel lymph node biopsy. Histologic type: Invasive ductal carcinoma. Grade: 3. Tubule formation: 3. Nuclear pleomorphism: 2. Mitotic: 3. Tumor size (gross measurement): 2.2 cm. Margins: Invasive, distance to closest margin: Invasive ductal carcinoma is focally 0.1 to 0.2 cm away from medial margin. In-situ, distance to closest margin: Ductal carcinoma in situ is focally 0.1 cm from lateral margin. If margin positive, focally or broadly: Marginal surface is not positive for tumor. Lymphovascular invasion: Definitive lymph/vascular invasion is not identified. Ductal carcinoma in situ: Yes. Grade: Intermediate grade. Extensive intraductal component: No. Lobular neoplasia: Not identified. Tumor focality: Unifocal. Extent of tumor: Tumor confined to breast parenchyma. Skin: Not  received. Nipple: Not received. Skeletal muscle: Not received. Lymph nodes: Examined: 1 Sentinel. 0 Non-sentinel. 1 Total. Lymph nodes with metastasis: 0. Isolated tumor cells (< 0.2 mm): 0. Micrometastasis: (> 0.2 mm and < 2.0 mm): 0. Macrometastasis: (> 2.0 mm): 0. Extracapsular extension: Not applicable. Breast prognostic profile: Assessed on previous biopsy ([id number]): Estrogen receptor: 100%, positive. Progesterone receptor: 90%, positive. Her 2 neu: 2+ equivocal staining on immunohistochemical stain. FISH is pending on biopsy, per previous biospy report. Ki-67: 30%. Non-neoplastic breast: Fibrocystic changes. TNM: pT2, pN0. (RH:kh 11-18-15)  Diagnosis 10/27/2015 Consult Slide , Left Breast Biopsy - INVASIVE DUCTAL CARCINOMA, SEE COMMENT. - DUCTAL CARCINOMA IN SITU. Microscopic Comment While grading is best performed on the resection specimen the carcinoma appears grade 3. E-cadherin is positive confirming a ductal phenotype. Beta-catenin is positive. p53 exhibits weak staining. ER: Positive, 100%, strong staining intensity. PR: Positive, 90%, strong staining intensity. Ki-67: 30% Her2 IHC: 2+ Equivocal, FISH (-), with average copy number of 2, and a ratio of 1.07.  Oncotype RS 23, intermediate risk, which predicts 10 year risk of distant recurrence 15% with tamoxifen  RADIOGRAPHIC STUDIES: I have personally reviewed the radiological images as listed and agreed with the findings in the report. No results found.  ASSESSMENT & PLAN: 57 y.o. Caucasian post menopause female, presented with screening discovered left breast cancer  1. 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 -We previously discussed her Oncotype DX test  results. The recurrence score is 23, which is intermedia risk, it protects 10 year of distant recurrence risk of 15% with tamoxifen. The benefit of adjuvant chemotherapy intermediate risk group is uncertain, however given her young age, grade 3 disease, I think adjuvant chemo will probably reduce  her risk of recurrence although the benefit is not very high. She is very concerned about cancer recurrence. After lengthy discussion, we decided to proceed with adjuvant chemotherapy -She has completed adjuvant chemotherapy -She has completed adjuvant radiation.  -she has started adjuvant Letrozole 2.5 mg, tolerating well overall overall except mild arthralgia and stiffness, if this worsens we will consider exemestane.  -Continue breast cancer surveillance with yearly mammograms, self-exams, and clinic exam. -I encouraged her to eat healthy and exercise regularly -She is clinically doing well. Her mammogram in 09/2016 was normal, CBC and CMP are overall within normal limits, Breast Exam normal. She shows no sign of recurrence.  -she will f/u with [place] in 4 months  2. peripheral neuropathy, grade 1 -Secondary to adjuvant chemotherapy -I previously encouraged her to gradually increase her activity levels, and try to exercise -She has mild neuropathy, we'll continue observation.  -I previously advised her to take VitB complex to help. -Her neuropathy in her feet has improved but still present in hands. Will continue to monitor.   3. HTN -She is on antihypertensive medication, including HCTZ -Follow-up of his primary care physician  4. Osteopenia -Her Bone density from 10/20/16 shows she is slightly osteopenic, her T-score -1.1 -I again suggest she takes calcium, vitamin D and exercise regularly. I also suggested that she eat more calcium rich foods.   5. Genetic Counseling -The patient inquired about genetic testing. -We previously discussed that given she does not have a family history of breast cancer,  genetic counseling is not recommended. -We previously discussed it is likely insurance may not cover the testing and that an out of pocket cost would be ~$200. -She has a daughter and her daughter should start mammograms at age 49.  61. Obesity -I strongly encouraged her to have healthy diet, and exercise regularly, try to lose some weight. She is motivated, will try.  -I discussed how letrozole can cause her to gain weight. I encourage her to diet and exercise and to watch her weight.   7. Arthralgia and muscle achiness in both arms and wrists -likely secondary to Letrozole, I discussed the side effects to her -I suggest she exercises and stretches as well as take over-the-counter joint medication and ibuprofen before bed.  -If the aches worsen I can switch her to exemestane.    PLAN -Continue letrozole  -Lab and f/u in 4 months     All questions were answered. The patient knows to call the clinic with any problems, questions or concerns.  I spent 25 minutes counseling the patient face to face. The total time spent in the appointment was 30 minutes and more than 50% was on counseling.     [name], MD 03/05/2017   This document serves as a record of services personally performed by [name], MD. It was created on her behalf by Reola Mosher, a trained medical scribe. The creation of this record is based on the scribe's personal observations and the provider's statements to them. This document has been checked and approved by the attending provider.  I have reviewed the above documentation for accuracy and completeness, and I agree with the above.

## 2017-03-05 ENCOUNTER — Inpatient Hospital Stay: Payer: BLUE CROSS/BLUE SHIELD | Admitting: Hematology

## 2017-03-05 ENCOUNTER — Inpatient Hospital Stay: Payer: BLUE CROSS/BLUE SHIELD | Attending: Hematology

## 2017-03-05 ENCOUNTER — Encounter: Payer: Self-pay | Admitting: Hematology

## 2017-03-05 VITALS — BP 114/55 | HR 70 | Temp 97.8°F | Resp 20 | Ht 65.0 in | Wt 283.2 lb

## 2017-03-05 DIAGNOSIS — G620 Drug-induced polyneuropathy: Secondary | ICD-10-CM

## 2017-03-05 DIAGNOSIS — M8580 Other specified disorders of bone density and structure, unspecified site: Secondary | ICD-10-CM | POA: Diagnosis not present

## 2017-03-05 DIAGNOSIS — C50412 Malignant neoplasm of upper-outer quadrant of left female breast: Secondary | ICD-10-CM

## 2017-03-05 DIAGNOSIS — Z79811 Long term (current) use of aromatase inhibitors: Secondary | ICD-10-CM | POA: Diagnosis not present

## 2017-03-05 DIAGNOSIS — I10 Essential (primary) hypertension: Secondary | ICD-10-CM | POA: Insufficient documentation

## 2017-03-05 DIAGNOSIS — Z170 Estrogen receptor positive status [ER+]: Principal | ICD-10-CM

## 2017-03-05 DIAGNOSIS — E669 Obesity, unspecified: Secondary | ICD-10-CM | POA: Insufficient documentation

## 2017-03-05 DIAGNOSIS — M25532 Pain in left wrist: Secondary | ICD-10-CM | POA: Insufficient documentation

## 2017-03-05 DIAGNOSIS — T451X5D Adverse effect of antineoplastic and immunosuppressive drugs, subsequent encounter: Secondary | ICD-10-CM | POA: Diagnosis not present

## 2017-03-05 DIAGNOSIS — M25531 Pain in right wrist: Secondary | ICD-10-CM | POA: Diagnosis not present

## 2017-03-05 LAB — CBC WITH DIFFERENTIAL/PLATELET
Abs Granulocyte: 4.9 10*3/uL (ref 1.5–6.5)
Basophils Absolute: 0 10*3/uL (ref 0.0–0.1)
Basophils Relative: 0 %
EOS ABS: 0.1 10*3/uL (ref 0.0–0.5)
EOS PCT: 2 %
HCT: 39.8 % (ref 34.8–46.6)
HEMOGLOBIN: 12.8 g/dL (ref 11.6–15.9)
Lymphocytes Relative: 31 %
Lymphs Abs: 2.6 10*3/uL (ref 0.9–3.3)
MCH: 28.7 pg (ref 25.1–34.0)
MCHC: 32.2 g/dL (ref 31.5–36.0)
MCV: 89.2 fL (ref 79.5–101.0)
Monocytes Absolute: 0.6 10*3/uL (ref 0.1–0.9)
Monocytes Relative: 8 %
NEUTROS ABS: 4.9 10*3/uL (ref 1.5–6.5)
Neutrophils Relative %: 59 %
PLATELETS: 281 10*3/uL (ref 145–400)
RBC: 4.46 MIL/uL (ref 3.70–5.45)
RDW: 13.5 % (ref 11.2–16.1)
WBC: 8.2 10*3/uL (ref 3.9–10.3)

## 2017-03-05 LAB — COMPREHENSIVE METABOLIC PANEL
ALK PHOS: 119 U/L (ref 40–150)
ALT: 17 U/L (ref 0–55)
ANION GAP: 8 (ref 3–11)
AST: 13 U/L (ref 5–34)
Albumin: 3.6 g/dL (ref 3.5–5.0)
BUN: 15 mg/dL (ref 7–26)
CALCIUM: 9.5 mg/dL (ref 8.4–10.4)
CHLORIDE: 106 mmol/L (ref 98–109)
CO2: 26 mmol/L (ref 22–29)
CREATININE: 0.78 mg/dL (ref 0.60–1.10)
Glucose, Bld: 92 mg/dL (ref 70–140)
Potassium: 4.5 mmol/L (ref 3.3–4.7)
SODIUM: 140 mmol/L (ref 136–145)
Total Bilirubin: 0.3 mg/dL (ref 0.2–1.2)
Total Protein: 7.2 g/dL (ref 6.4–8.3)

## 2017-03-07 ENCOUNTER — Telehealth: Payer: Self-pay | Admitting: Hematology

## 2017-03-07 NOTE — Telephone Encounter (Signed)
Scheduled appt per 1/7 los - Lab and f/u in 4 months - Sent reminder letter in the mail.

## 2017-04-03 ENCOUNTER — Other Ambulatory Visit: Payer: Self-pay | Admitting: *Deleted

## 2017-04-03 DIAGNOSIS — C50412 Malignant neoplasm of upper-outer quadrant of left female breast: Secondary | ICD-10-CM

## 2017-04-03 DIAGNOSIS — Z170 Estrogen receptor positive status [ER+]: Principal | ICD-10-CM

## 2017-04-03 MED ORDER — LETROZOLE 2.5 MG PO TABS: 2.5000 mg | ORAL_TABLET | Freq: Every day | ORAL | 2 refills | Status: DC

## 2017-06-25 ENCOUNTER — Telehealth: Payer: Self-pay | Admitting: Hematology

## 2017-06-25 NOTE — Telephone Encounter (Signed)
Patient called to reschedule  °

## 2017-07-02 NOTE — Progress Notes (Signed)
[place]  [phone] Fax:([phone]) [phone]  Clinic Follow up Note   Patient Care Team: System, Pcp Not In as PCP - General 07/04/2017   CHIEF COMPLAINTS:  Follow up left breast cancer  Oncology History   Breast cancer of upper-outer quadrant of left female breast [facility])   Staging form: Breast, AJCC 7th Edition   - Pathologic: Stage IIA (T2, N0, cM0) - Unsigned      Breast cancer of upper-outer quadrant of left female breast ([place])   10/20/2015 Mammogram    Screening mammogram showed new nodular density on the left breast, measuring about 2 cm. the well defined nodules bilaterally are otherwise stable.      10/27/2015 Initial Biopsy    Left breast 2:00 position mass biopsy showed infiltrating ductal carcinoma.       10/27/2015 Initial Diagnosis    Breast cancer of upper-outer quadrant of left female breast ([place])      10/27/2015 Receptors her2    ER 100% positive, strong staining, PR 90% positive, strong staining, HER-2 IHC 2+, FISH negative.      11/16/2015 Surgery    Left breast lumpectomy and sentinel lymph node biopsy.      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 Margins were negative. One sentinel lymph node was negative. Lymphovascular invasion was negative.      11/16/2015 Oncotype testing    RS 23, intermediate risk, which predicts 10 year risk of distant recurrence 15% with tamoxifen      12/23/2015 - 03/01/2016 Adjuvant Chemotherapy    Docetaxel and Cytoxan, every 3 weeks, with Neulasta on day 2, for 4 cycles      03/27/2016 - 05/10/2016 Radiation Therapy    Adjuvant breast radiation 03/27/16 - 05/10/16 60.4 Gy in 33 fractions to the left breast by Dr. Tammi Klippel.      05/31/2016 -  Anti-estrogen oral therapy    Letrozole 2.5 mg daily        HISTORY OF PRESENTING ILLNESS:  Nicole Schmitt 57 y.o. female is here because of Her recently resected  left breast cancer. She presents to the clinic by herself. She was referred by her surgeon Dr. Excell Seltzer.  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 Her breast cancer was discovered by screening mammogram, and her initial workup was done in [place].She recently underwent a screening mamogram which revealed a possible mass in the left breast. Subsequent imaging included diagnostic mamogram showing a persistent area of asymmetric density in the left breast and ultrasound showing a 1.8 x 0.7 cm lobulated mass at the 2 o'clock position in the left breast 6 cm from the nipple. An ultrasound guided breast biopsy was performed on October 27, 2015 with pathology revealing invasive ductal carcinoma of the breast. She was referred to (surgeon Dr. Excell Seltzer, and underwent left breast lumpectomy and sentinel lymph node biopsy on 11/16/2015. The surgical pathology showed invasive grade 3 ductal carcinoma, 2.2 cm, one lymph node was negative.   She has been recovering well from her surgery. The pain to this surgical incision is minimal, she does not take much pain medication. No limited range of movement of left shoulder. Her appetite and energy level has been back to normal, she denies any other symptoms.  Her husband was diagnosed with metastatic melanoma in early 2016, has been receiving immunotherapy at [facility]. Her father was also  recently diagnosed with CLL, but does not require treatment. She has been under lot of stress in the past few years.  CURRENT THERAPY: Letrozole 2.5 mg, 1 tablet daily, started 05/31/16  INTERIM HISTORY:  Nicole Schmitt returns for follow-up. She presents to the clinic today by herself. She reports she is doing well overall. She is compliant with Letrozole and reports she still has joint pain but she thinks it is improving. She notes to have a small nodule in her left breast that has previously been evaluate by me and her surgeon.   On  review of systems, pt denies pain, or any other complaints at this time. Pertinent positives are listed and detailed within the above HPI.   MEDICAL HISTORY:  Past Medical History:  Diagnosis Date  . Breast cancer ([place])   . Cancer ([place]) 1990   cervical   . Hypertension   . Personal history of chemotherapy    2017  . Personal history of radiation therapy    2018  . Seizures ([place])    > 20 years following fall from horse    SURGICAL HISTORY: Past Surgical History:  Procedure Laterality Date  . BREAST LUMPECTOMY Left 11/16/2015   Malignant  . BREAST LUMPECTOMY WITH RADIOACTIVE SEED AND SENTINEL LYMPH NODE BIOPSY Left 11/16/2015   Procedure: BREAST LUMPECTOMY WITH RADIOACTIVE SEED AND SENTINEL LYMPH NODE BIOPSY;  Surgeon: Excell Seltzer, MD;  Location: [place];  Service: General;  Laterality: Left;  . CESAREAN SECTION    . COLONOSCOPY    . WISDOM TOOTH EXTRACTION      SOCIAL HISTORY: Social History   Socioeconomic History  . Marital status: Married    Spouse name: Not on file  . Number of children: Not on file  . Years of education: Not on file  . Highest education level: Not on file  Occupational History  . Not on file  Social Needs  . Financial resource strain: Not on file  . Food insecurity:    Worry: Not on file    Inability: Not on file  . Transportation needs:    Medical: Not on file    Non-medical: Not on file  Tobacco Use  . Smoking status: Never Smoker  . Smokeless tobacco: Never Used  Substance and Sexual Activity  . Alcohol use: Yes    Comment: rare  . Drug use: No  . Sexual activity: Yes  Lifestyle  . Physical activity:    Days per week: Not on file    Minutes per session: Not on file  . Stress: Not on file  Relationships  . Social connections:    Talks on phone: Not on file    Gets together: Not on file    Attends religious service: Not on file    Active member of club or organization: Not on file    Attends meetings of  clubs or organizations: Not on file    Relationship status: Not on file  . Intimate partner violence:    Fear of current or ex partner: Not on file    Emotionally abused: Not on file    Physically abused: Not on file    Forced sexual activity: Not on file  Other Topics Concern  . Not on file  Social History Narrative  . Not on file    FAMILY HISTORY: Family History  Problem Relation Age of Onset  . Cancer Maternal Uncle        liver cancer/environmental exposure  . Cancer  Paternal Uncle        liver cancer/environmental exposure  . Epilepsy Mother   . Intracerebral hemorrhage Mother   . COPD Father   . Breast cancer Neg Hx     ALLERGIES:  has No Known Allergies.  MEDICATIONS:  Current Outpatient Medications  Medication Sig Dispense Refill  . CLINDAGEL 1 % gel Apply 1 application topically daily as needed.     . hydrochlorothiazide (HYDRODIURIL) 25 MG tablet Take 25 mg by mouth daily. 12.5 mg to 25 mg dependent on bp medication    . letrozole (FEMARA) 2.5 MG tablet Take 1 tablet (2.5 mg total) by mouth daily. 90 tablet 2  . non-metallic deodorant (ALRA) MISC Apply 1 application topically daily as needed.    . valsartan (DIOVAN) 160 MG tablet Take 1 tablet (160 mg total) by mouth daily. 30 tablet 0   No current facility-administered medications for this visit.     REVIEW OF SYSTEMS:   Constitutional: Denies fevers, chills  Eyes: Denies blurriness of vision, double vision or watery eyes Ears, nose, mouth, throat, and face: Denies mucositis or sore throat Respiratory: Denies cough, dyspnea or wheezes Cardiovascular: Denies palpitation, chest discomfort or lower extremity swelling Gastrointestinal:  Denies nausea, heartburn or change in bowel habits Skin: Denies abnormal skin rashes Lymphatics: Denies new lymphadenopathy or easy bruising Neurological: Denies numbness or weakness MSK: (+) Aches in her arms/wrists at night, stiffness in the mornings of the fingers,  improving  Behavioral/Psych: Mood is stable, no new changes  All other systems were reviewed with the patient and are negative.  PHYSICAL EXAMINATION: ECOG PERFORMANCE STATUS: 1  Vitals:   07/04/17 0934  BP: 128/67  Pulse: 62  Resp: 17  Temp: 98.5 F (36.9 C)  SpO2: 100%   Filed Weights   07/04/17 0934  Weight: 262 lb 12.8 oz (119.2 kg)    GENERAL:alert, no distress and comfortable SKIN: skin color, texture, turgor are normal, no rashes or significant lesions EYES: normal, conjunctiva are pink and non-injected, sclera clear OROPHARYNX:no exudate, no erythema and lips, buccal mucosa, and tongue normal  NECK: supple, thyroid normal size, non-tender, without nodularity LYMPH:  no palpable lymphadenopathy in the cervical, axillary or inguinal LUNGS: clear to auscultation and percussion with normal breathing effort HEART: regular rate & rhythm and no murmurs and no lower extremity edema ABDOMEN:abdomen soft, non-tender and normal bowel sounds Musculoskeletal:no cyanosis of digits and no clubbing  PSYCH: alert & oriented x 3 with fluent speech NEURO: no focal motor/sensory deficits BREAST:Breast inspection showed them to be symmetrical with no nipple discharge. (+) Surgical scar in the left breast has healed well. She has a 8 mm nodule at the 2 o'clock position 5 cm from her nipple, likely related to her incision. Palpation of the left breast and axilla revealed no obvious mass that I could appreciate.   LABORATORY DATA:  I have reviewed the data as listed CBC Latest Ref Rng & Units 07/04/2017 03/05/2017 10/26/2016  WBC 3.9 - 10.3 K/uL 7.4 8.2 6.1  Hemoglobin 11.6 - 15.9 g/dL 13.8 12.8 13.5  Hematocrit 34.8 - 46.6 % 41.1 39.8 40.3  Platelets 145 - 400 K/uL 276 281 247     CMP Latest Ref Rng & Units 07/04/2017 03/05/2017 10/26/2016  Glucose 70 - 140 mg/dL 96 92 95  BUN 7 - 26 mg/dL 18 15 14.9  Creatinine 0.60 - 1.10 mg/dL 0.78 0.78 0.7  Sodium 136 - 145 mmol/L 140 140 142  Potassium  3.5 - 5.1 mmol/L  3.8 4.5 4.3  Chloride 98 - 109 mmol/L 105 106 -  CO2 22 - 29 mmol/L _0 Calcium 8.4 - 10.4 mg/dL 10.2 9.5 9.9  Total Protein 6.4 - 8.3 g/dL 7.4 7.2 7.0  Total Bilirubin 0.2 - 1.2 mg/dL 0.5 0.3 0.44  Alkaline Phos 40 - 150 U/L 124 119 113  AST 5 - 34 U/L _1 ALT 0 - 55 U/L _2 PATHOLOGY REPORT Diagnosis 11/16/2015    1. Breast, lumpectomy, Left w/seed - INVASIVE GRADE 3 DUCTAL CARCINOMA, SPANNING 2.2 CM IN GREATEST DIMENSION. 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 - ASSOCIATED PROMINENT LYMPHOID RESPONSE. - INVASIVE DUCTAL CARCINOMA IS FOCALLY 0.1 TO 0.2 CM AWAY FROM MEDIAL MARGIN BUT MARGINAL SURFACE IS NEGATIVE FOR INVASIVE TUMOR. - DUCTAL CARCINOMA IN SITU IS FOCALLY 0.1 CM AWAY FROM LATERAL MARGIN - OTHER MARGINS ARE NEGATIVE. - SEE ONCOLOGY TEMPLATE. 2. Lymph node, sentinel, biopsy, Left Axillary - ONE BENIGN LYMPH NODE WITH NO TUMOR SEEN (0/1). Microscopic Comment 1. BREAST, INVASIVE TUMOR, WITH LYMPH NODES PRESENT Specimen, including laterality and lymph node sampling (sentinel, non-sentinel): Left partial breast with left axillary sentinel lymph node. Procedure: Left breast lumpectomy with left axillary sentinel lymph node biopsy. Histologic type: Invasive ductal carcinoma. Grade: 3. Tubule formation: 3. Nuclear pleomorphism: 2. Mitotic: 3. Tumor size (gross measurement): 2.2 cm. Margins: Invasive, distance to closest margin: Invasive ductal carcinoma is focally 0.1 to 0.2 cm away from medial margin. In-situ, distance to closest margin: Ductal carcinoma in situ is focally 0.1 cm from lateral margin. If margin positive, focally or broadly: Marginal surface is not positive for tumor. Lymphovascular invasion: Definitive lymph/vascular invasion is not identified. Ductal carcinoma in situ: Yes. Grade: Intermediate grade. Extensive intraductal component: No. Lobular neoplasia: Not identified. Tumor focality:  Unifocal. Extent of tumor: Tumor confined to breast parenchyma. Skin: Not received. Nipple: Not received. Skeletal muscle: Not received. Lymph nodes: Examined: 1 Sentinel. 0 Non-sentinel. 1 Total. Lymph nodes with metastasis: 0. Isolated tumor cells (< 0.2 mm): 0. Micrometastasis: (> 0.2 mm and < 2.0 mm): 0. Macrometastasis: (> 2.0 mm): 0. Extracapsular extension: Not applicable. Breast prognostic profile: Assessed on previous biopsy ([id number]): Estrogen receptor: 100%, positive. Progesterone receptor: 90%, positive. Her 2 neu: 2+ equivocal staining on immunohistochemical stain. FISH is pending on biopsy, per previous biospy report. Ki-67: 30%. Non-neoplastic breast: Fibrocystic changes. TNM: pT2, pN0. (RH:kh 11-18-15)  Diagnosis 10/27/2015 Consult Slide , Left Breast Biopsy - INVASIVE DUCTAL CARCINOMA, SEE COMMENT. - DUCTAL CARCINOMA IN SITU. Microscopic Comment While grading is best performed on the resection specimen the carcinoma appears grade 3. E-cadherin is positive confirming a ductal phenotype. Beta-catenin is positive. p53 exhibits weak staining. ER: Positive, 100%, strong staining intensity. PR: Positive, 90%, strong staining intensity. Ki-67: 30% Her2 IHC: 2+ Equivocal, FISH (-), with average copy number of 2, and a ratio of 1.07.  Oncotype RS 23, intermediate risk, which predicts 10 year risk of distant recurrence 15% with tamoxifen  RADIOGRAPHIC STUDIES: I have personally reviewed the radiological images as listed and agreed with the findings in the report. No results found.  ASSESSMENT & PLAN: 57 y.o. Caucasian post menopause female, presented with screening discovered left breast cancer  1. 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 -We previously discussed her Oncotype DX test results. The recurrence score is 23, which is intermedia risk, it protects 10 year of distant recurrence risk of 15% with tamoxifen. The benefit of adjuvant chemotherapy intermediate risk group is uncertain, however given her young age, grade 3 disease, I think adjuvant chemo will probably reduce her risk of recurrence although the benefit is not very high. She is very concerned about cancer recurrence. After lengthy discussion, we decided to proceed with adjuvant chemotherapy -She completed adjuvant chemotherapy and adjuvant radiation.  -she has started adjuvant Letrozole 2.5 mg, tolerating well overall overall except mild arthralgia and stiffness, if this worsens we will consider exemestane.  -Continue breast cancer surveillance with yearly mammograms, self-exams, and clinic exam. -I encouraged her to eat healthy and exercise regularly -She is clinically doing well. Her mammogram in 09/2016 was normal, CBC is within normal limits, CMP is pending. Her breast exam normal with the exception of a 8 mm nodule at the 2 o'clock position 5 cm from her nipple, likely related to her surgical incision. She is due for a mammogram in Aug 2019, ordered today. She knows to call us if it gets bigger or has any other abnormal activity.  -She is still seeing Dr. Excell Seltzer. I discussed she can space out her appointments with him and [place] -Survivorship clinic in 6 months  -F/u in 1 year  2. peripheral neuropathy, grade 1 -Secondary to adjuvant chemotherapy -I previously encouraged her to gradually increase her activity levels, and try to exercise -She has mild neuropathy, we'll continue observation.  -I previously advised her to take VitB complex to help. -Her neuropathy in her feet has improved but still present in hands. Will continue to monitor.   3. HTN -She is on antihypertensive medication, including HCTZ -Follow-up of his primary care  physician  4. Osteopenia -Her Bone density from 10/20/16 shows she is slightly osteopenic, her T-score -1.1 -I previously suggested she takes calcium, vitamin D and exercise regularly. I also suggested that she eat more calcium rich foods.   5. Obesity -I strongly encouraged her to have healthy diet, and exercise regularly, try to lose some weight. She is motivated, will try.  -I previously discussed how letrozole can cause her to gain weight. I encourage her to diet and exercise and to watch her weight.   6. Arthralgia and muscle achiness in both arms and wrists -likely secondary to Letrozole, I previously discussed the side effects to her -I previously suggested she exercises and stretches as well as take over-the-counter joint medication and ibuprofen before bed.  -If the aches worsen I can switch her to exemestane.  -Improving (06/2017), it's mild and tolerable now    PLAN -Continue letrozole  -Mammogram in Aug 2019, ordered today -Survivorship clinic in 6 months  -F/u in 1 year with lab   All questions were answered. The patient knows to call the clinic with any problems, questions or concerns.  I spent 20 minutes counseling the patient face to face. The total time spent in the appointment was 25 minutes and more than 50% was on counseling.  This document serves as a record of services personally performed by [name], MD. It was created on her behalf by Theresia Bough, a trained medical scribe. The creation of this record is based on the scribe's personal observations and the provider's statements to them.   I have reviewed the above documentation for accuracy and completeness, and I agree with the above.    Krista Blue  Burr Medico, MD 07/04/2017

## 2017-07-04 ENCOUNTER — Inpatient Hospital Stay: Payer: BLUE CROSS/BLUE SHIELD | Admitting: Hematology

## 2017-07-04 ENCOUNTER — Encounter: Payer: Self-pay | Admitting: Hematology

## 2017-07-04 ENCOUNTER — Telehealth: Payer: Self-pay

## 2017-07-04 ENCOUNTER — Inpatient Hospital Stay: Payer: BLUE CROSS/BLUE SHIELD | Attending: Hematology

## 2017-07-04 VITALS — BP 128/67 | HR 62 | Temp 98.5°F | Resp 17 | Ht 65.0 in | Wt 262.8 lb

## 2017-07-04 DIAGNOSIS — M25532 Pain in left wrist: Secondary | ICD-10-CM | POA: Diagnosis not present

## 2017-07-04 DIAGNOSIS — M25531 Pain in right wrist: Secondary | ICD-10-CM | POA: Diagnosis not present

## 2017-07-04 DIAGNOSIS — E669 Obesity, unspecified: Secondary | ICD-10-CM | POA: Insufficient documentation

## 2017-07-04 DIAGNOSIS — G620 Drug-induced polyneuropathy: Secondary | ICD-10-CM

## 2017-07-04 DIAGNOSIS — M8580 Other specified disorders of bone density and structure, unspecified site: Secondary | ICD-10-CM | POA: Diagnosis not present

## 2017-07-04 DIAGNOSIS — I10 Essential (primary) hypertension: Secondary | ICD-10-CM

## 2017-07-04 DIAGNOSIS — Z170 Estrogen receptor positive status [ER+]: Secondary | ICD-10-CM | POA: Insufficient documentation

## 2017-07-04 DIAGNOSIS — C50412 Malignant neoplasm of upper-outer quadrant of left female breast: Secondary | ICD-10-CM | POA: Diagnosis not present

## 2017-07-04 DIAGNOSIS — Z79811 Long term (current) use of aromatase inhibitors: Secondary | ICD-10-CM

## 2017-07-04 LAB — COMPREHENSIVE METABOLIC PANEL
ALBUMIN: 3.9 g/dL (ref 3.5–5.0)
ALT: 18 U/L (ref 0–55)
AST: 17 U/L (ref 5–34)
Alkaline Phosphatase: 124 U/L (ref 40–150)
Anion gap: 8 (ref 3–11)
BUN: 18 mg/dL (ref 7–26)
CHLORIDE: 105 mmol/L (ref 98–109)
CO2: 27 mmol/L (ref 22–29)
CREATININE: 0.78 mg/dL (ref 0.60–1.10)
Calcium: 10.2 mg/dL (ref 8.4–10.4)
GFR calc Af Amer: >60 mL/min (ref >60)
GFR calc non Af Amer: >60 mL/min (ref >60)
GLUCOSE: 96 mg/dL (ref 70–140)
POTASSIUM: 3.8 mmol/L (ref 3.5–5.1)
SODIUM: 140 mmol/L (ref 136–145)
Total Bilirubin: 0.5 mg/dL (ref 0.2–1.2)
Total Protein: 7.4 g/dL (ref 6.4–8.3)

## 2017-07-04 LAB — CBC WITH DIFFERENTIAL/PLATELET
BASOS ABS: 0 10*3/uL (ref 0.0–0.1)
Basophils Relative: 0 %
EOS PCT: 1 %
Eosinophils Absolute: 0.1 10*3/uL (ref 0.0–0.5)
HCT: 41.1 % (ref 34.8–46.6)
Hemoglobin: 13.8 g/dL (ref 11.6–15.9)
LYMPHS PCT: 35 %
Lymphs Abs: 2.6 10*3/uL (ref 0.9–3.3)
MCH: 29 pg (ref 25.1–34.0)
MCHC: 33.5 g/dL (ref 31.5–36.0)
MCV: 86.6 fL (ref 79.5–101.0)
MONO ABS: 0.7 10*3/uL (ref 0.1–0.9)
Monocytes Relative: 9 %
Neutro Abs: 4.1 10*3/uL (ref 1.5–6.5)
Neutrophils Relative %: 55 %
PLATELETS: 276 10*3/uL (ref 145–400)
RBC: 4.74 MIL/uL (ref 3.70–5.45)
RDW: 14.1 % (ref 11.2–14.5)
WBC: 7.4 10*3/uL (ref 3.9–10.3)

## 2017-07-04 NOTE — Telephone Encounter (Signed)
Printed avs and calender of upcominig appointment. Per 5/8  los

## 2017-07-05 ENCOUNTER — Other Ambulatory Visit: Payer: BLUE CROSS/BLUE SHIELD

## 2017-07-05 ENCOUNTER — Ambulatory Visit: Payer: BLUE CROSS/BLUE SHIELD | Admitting: Hematology

## 2017-09-29 ENCOUNTER — Other Ambulatory Visit: Payer: Self-pay | Admitting: Nurse Practitioner

## 2017-10-26 ENCOUNTER — Ambulatory Visit
Admission: RE | Admit: 2017-10-26 | Discharge: 2017-10-26 | Disposition: A | Discharge status: Home / Self Care | Payer: BLUE CROSS/BLUE SHIELD | Source: Ambulatory Visit | Attending: Hematology | Admitting: Hematology

## 2017-10-26 DIAGNOSIS — Z170 Estrogen receptor positive status [ER+]: Principal | ICD-10-CM

## 2017-10-26 DIAGNOSIS — C50412 Malignant neoplasm of upper-outer quadrant of left female breast: Secondary | ICD-10-CM

## 2017-11-11 ENCOUNTER — Other Ambulatory Visit: Payer: Self-pay | Admitting: Hematology

## 2017-11-11 DIAGNOSIS — C50412 Malignant neoplasm of upper-outer quadrant of left female breast: Secondary | ICD-10-CM

## 2017-11-11 DIAGNOSIS — Z170 Estrogen receptor positive status [ER+]: Principal | ICD-10-CM

## 2017-12-26 ENCOUNTER — Telehealth: Payer: Self-pay

## 2017-12-26 NOTE — Telephone Encounter (Signed)
Spoke with patient reminding of SCP visit with NP on 01/01/18 at 1 pm.  Patient said she come to appt.

## 2018-01-01 ENCOUNTER — Encounter: Payer: Self-pay | Admitting: Adult Health

## 2018-01-01 ENCOUNTER — Telehealth: Payer: Self-pay | Admitting: Adult Health

## 2018-01-01 ENCOUNTER — Inpatient Hospital Stay: Payer: BLUE CROSS/BLUE SHIELD | Attending: Adult Health | Admitting: Adult Health

## 2018-01-01 VITALS — BP 121/87 | HR 77 | Temp 98.6°F | Resp 18 | Ht 65.0 in | Wt 250.2 lb

## 2018-01-01 DIAGNOSIS — Z9221 Personal history of antineoplastic chemotherapy: Secondary | ICD-10-CM | POA: Insufficient documentation

## 2018-01-01 DIAGNOSIS — T451X5A Adverse effect of antineoplastic and immunosuppressive drugs, initial encounter: Secondary | ICD-10-CM | POA: Diagnosis not present

## 2018-01-01 DIAGNOSIS — Z923 Personal history of irradiation: Secondary | ICD-10-CM | POA: Diagnosis not present

## 2018-01-01 DIAGNOSIS — G620 Drug-induced polyneuropathy: Secondary | ICD-10-CM | POA: Insufficient documentation

## 2018-01-01 DIAGNOSIS — I10 Essential (primary) hypertension: Secondary | ICD-10-CM | POA: Insufficient documentation

## 2018-01-01 DIAGNOSIS — Z170 Estrogen receptor positive status [ER+]: Secondary | ICD-10-CM | POA: Diagnosis not present

## 2018-01-01 DIAGNOSIS — Z79899 Other long term (current) drug therapy: Secondary | ICD-10-CM | POA: Diagnosis not present

## 2018-01-01 DIAGNOSIS — M8580 Other specified disorders of bone density and structure, unspecified site: Secondary | ICD-10-CM | POA: Insufficient documentation

## 2018-01-01 DIAGNOSIS — Z79811 Long term (current) use of aromatase inhibitors: Secondary | ICD-10-CM | POA: Diagnosis not present

## 2018-01-01 DIAGNOSIS — C50412 Malignant neoplasm of upper-outer quadrant of left female breast: Secondary | ICD-10-CM | POA: Insufficient documentation

## 2018-01-01 NOTE — Telephone Encounter (Signed)
No los per 11/5. °

## 2018-01-01 NOTE — Progress Notes (Signed)
CLINIC:  Survivorship   REASON FOR VISIT:  Routine follow-up post-treatment for a recent history of breast cancer.  BRIEF ONCOLOGIC HISTORY:  Oncology History   Breast cancer of upper-outer quadrant of left female breast ([place])   Staging form: Breast, AJCC 7th Edition   - Pathologic: Stage IIA (T2, N0, cM0) - Unsigned      Breast cancer of upper-outer quadrant of left female breast ([place])   10/20/2015 Mammogram    Screening mammogram showed new nodular density on the left breast, measuring about 2 cm. the well defined nodules bilaterally are otherwise stable.    10/27/2015 Initial Biopsy    Left breast 2:00 position mass biopsy showed infiltrating ductal carcinoma.     10/27/2015 Initial Diagnosis    Breast cancer of upper-outer quadrant of left female breast ([place])    10/27/2015 Receptors her2    ER 100% positive, strong staining, PR 90% positive, strong staining, HER-2 IHC 2+, FISH negative.    11/16/2015 Surgery    Left breast lumpectomy and sentinel lymph node biopsy.    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 Margins were negative. One sentinel lymph node was negative. Lymphovascular invasion was negative.    11/16/2015 Oncotype testing    RS 23, intermediate risk, which predicts 10 year risk of distant recurrence 15% with tamoxifen    12/23/2015 - 03/01/2016 Adjuvant Chemotherapy    Docetaxel and Cytoxan, every 3 weeks, with Neulasta on day 2, for 4 cycles    03/27/2016 - 05/10/2016 Radiation Therapy    Adjuvant breast radiation 03/27/16 - 05/10/16 60.4 Gy in 33 fractions to the left breast by Dr. Tammi Klippel.    05/31/2016 -  Anti-estrogen oral therapy    Letrozole 2.5 mg daily      INTERVAL HISTORY:  Ms. Wilmore presents to the [facility] today for our initial meeting to review her survivorship care plan detailing her treatment course for breast cancer, as well as monitoring  long-term side effects of that treatment, education regarding health maintenance, screening, and overall wellness and health promotion.     Overall, Ms. Upson reports feeling moderately well.  She is taking Letrozole daily and notes arthralgias.  She says this is all over and worse in the morning.  She notes it is worse in her shoulders and hands. She says lying down is worse, she takes tylenol or ibuprofen and hasn't noted whether it helps or not.  She thinks her biggest problem is not sleeping.  This is a long standing problem that has been present since prior to her breast cancer diagnosis.  She says that it has become worse since being diagnosed.  She was prescribed Remeron, and she doesn't take this often.  It was a high dose, so she wasn't able to wake up easily the next morning, so now she takes smaller doses and tolerates this better.    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  This started towards the end of her chemotherapy regimen.  She notes that it is in her hands and feet.  She denies balance issues with her feet.  In her hands it goes up to her wrists.  She does think that her carpal tunnel effects her numbness some.  She denies any motor changes in her fingertips.      REVIEW OF SYSTEMS:  Review of Systems  Constitutional: Negative for appetite change, chills, fatigue, fever and unexpected weight change.  Eyes: Negative for eye problems and icterus.  Respiratory: Negative for chest tightness, cough and shortness of breath.   Cardiovascular: Negative for chest pain, leg swelling and palpitations.  Gastrointestinal: Negative for abdominal distention, abdominal pain, constipation, diarrhea, nausea and vomiting.  Endocrine: Negative for hot flashes.  Skin: Negative for itching and rash.  Neurological: Negative for dizziness, extremity weakness, headaches and numbness.  Hematological: Negative for adenopathy. Does not bruise/bleed easily.  Psychiatric/Behavioral:  Negative for depression. The patient is not nervous/anxious.   Breast: Denies any new nodularity, masses, tenderness, nipple changes, or nipple discharge.      ONCOLOGY TREATMENT TEAM:  1. Surgeon:  Dr. Excell Seltzer at [facility] 2. Medical Oncologist: Dr. Burr Medico   3. Radiation Oncologist: Dr. Tammi Klippel    PAST MEDICAL/SURGICAL HISTORY:  Past Medical History:  Diagnosis Date  . Breast cancer ([place])   . Cancer ([place]) 1990   cervical   . Hypertension   . Personal history of chemotherapy    2017  . Personal history of radiation therapy    2018  . Seizures ([place])    > 20 years following fall from horse   Past Surgical History:  Procedure Laterality Date  . BREAST LUMPECTOMY Left 11/16/2015   Malignant  . BREAST LUMPECTOMY WITH RADIOACTIVE SEED AND SENTINEL LYMPH NODE BIOPSY Left 11/16/2015   Procedure: BREAST LUMPECTOMY WITH RADIOACTIVE SEED AND SENTINEL LYMPH NODE BIOPSY;  Surgeon: Excell Seltzer, MD;  Location: [facility];  Service: General;  Laterality: Left;  . CESAREAN SECTION    . COLONOSCOPY    . WISDOM TOOTH EXTRACTION       ALLERGIES:  No Known Allergies   CURRENT MEDICATIONS:  Outpatient Encounter Medications as of 01/01/2018  Medication Sig Note  . CLINDAGEL 1 % gel Apply 1 application topically daily as needed.    . hydrochlorothiazide (HYDRODIURIL) 25 MG tablet Take 25 mg by mouth daily. 12.5 mg to 25 mg dependent on bp medication 11/23/2015: Takes prn & usually 1/2 tab  . letrozole (FEMARA) 2.5 MG tablet TAKE 1 TABLET BY MOUTH EVERY DAY   . non-metallic deodorant (ALRA) MISC Apply 1 application topically daily as needed.   . valsartan (DIOVAN) 160 MG tablet Take 1 tablet (160 mg total) by mouth daily.    No facility-administered encounter medications on file as of 01/01/2018.      ONCOLOGIC FAMILY HISTORY:  Family History  Problem Relation Age of Onset  . Cancer Maternal Uncle        liver cancer/environmental exposure  . Cancer  Paternal Uncle        liver cancer/environmental exposure  . Epilepsy Mother   . Intracerebral hemorrhage Mother   . COPD Father   . Breast cancer Neg Hx      GENETIC COUNSELING/TESTING: Not at this time  SOCIAL HISTORY:  Social History   Socioeconomic History  . Marital status: Married    Spouse name: Not on file  . Number of children: Not on file  . Years of education: Not on file  . Highest education level: Not on file  Occupational History  . Not on file  Social Needs  . Financial resource strain: Not on file  . Food insecurity:    Worry: Not on file    Inability: Not on file  . Transportation needs:    Medical: Not on file    Non-medical: Not on file  Tobacco Use  . Smoking status: Never Smoker  . Smokeless tobacco: Never Used  Substance and Sexual Activity  . Alcohol use: Yes    Comment: rare  . Drug use: No  . Sexual activity: Yes  Lifestyle  . Physical activity:    Days per week: Not on file    Minutes per session: Not on file  . Stress: Not on file  Relationships  . Social connections:    Talks on phone: Not on file    Gets together: Not on file    Attends religious service: Not on file    Active member of club or organization: Not on file    Attends meetings of clubs or organizations: Not on file    Relationship status: Not on file  . Intimate partner violence:    Fear of current or ex partner: Not on file    Emotionally abused: Not on file    Physically abused: Not on file    Forced sexual activity: Not on file  Other Topics Concern  . Not on file  Social History Narrative  . Not on file      PHYSICAL EXAMINATION:  Vital Signs:   Vitals:   01/01/18 1252  BP: 121/87  Pulse: 77  Resp: 18  Temp: 98.6 F (37 C)  SpO2: 100%   Filed Weights   01/01/18 1252  Weight: 250 lb 3.2 oz (113.5 kg)   General: Well-nourished, well-appearing female in no acute distress.  She is accompanied in clinic by her daughter today.   HEENT: Head is  normocephalic.  Pupils equal and reactive to light. Conjunctivae clear without exudate.  Sclerae anicteric. Oral mucosa is pink, moist.  Oropharynx is pink without lesions or erythema.  Lymph: No cervical, supraclavicular, or infraclavicular lymphadenopathy noted on palpation.  Cardiovascular: Regular rate and rhythm.Marland Kitchen Respiratory: Clear to auscultation bilaterally. Chest expansion symmetric; breathing non-labored.  Breast: left breast s/p lumpectomy, no sign of local recurrence, right breast benign GI: Abdomen soft and round; non-tender, non-distended. Bowel sounds normoactive.  GU: Deferred.  Neuro: No focal deficits. Steady gait.  Psych: Mood and affect normal and appropriate for situation.  Extremities: No edema. MSK: No focal spinal tenderness to palpation.  Full range of motion in bilateral upper extremities Skin: Warm and dry.  LABORATORY DATA:  None for this visit.  DIAGNOSTIC IMAGING:  None for this visit.      ASSESSMENT AND PLAN:  Ms.. Onofre is a pleasant 57 y.o. female with Stage IIA left breast invasive ductal carcinoma, ER+/PR+/HER2-, diagnosed in 09/2015, treated with lumpectomy, adjuvant chemotherapy, adjuvant radiation therapy, and anti-estrogen therapy with Letrozole beginning in 05/2016.  She presents to the Survivorship Clinic for our initial meeting and routine follow-up post-completion of treatment for breast cancer.    1. Stage IIA right/left breast cancer:  Ms. Rockwood is continuing to recover from definitive treatment for breast cancer. She will follow-up with her medical oncologist, Dr. Burr Medico in 06/2018 with history and physical exam per surveillance protocol.  She will continue her anti-estrogen therapy with Letrozole. Thus far, she is tolerating the Letrozole well, with minimal side effects.Today, a comprehensive survivorship care plan and treatment summary was reviewed with the patient today detailing her breast cancer diagnosis, treatment course, potential  late/long-term effects of treatment, appropriate follow-up care with recommendations for the future, and patient education resources.  A copy of this summary, along with a letter will be sent to the patient's primary care provider via mail/fax/In Basket message after today's visit.    2. Arthralgias: Recommended yoga/tai chi and exercise to help with these.  These are tolerable for her.  3. Chemotherapy induced peripheral neuropathy: This is not impacting her activities of daily living, and is starting to improve.  We will monitor this.    4. Bone health:  Given Ms. Coover's age/history of breast cancer and her current treatment regimen including anti-estrogen therapy with Letrozole, she is at risk for bone demineralization.  Her last DEXA scan was 10/20/2016, which showed early osteopenia with a T score of -1.1 in the left femur. In the meantime, she was encouraged to increase her consumption of foods rich in calcium, as well as increase her weight-bearing activities.  She was given education on specific activities to promote bone health.  5. Cancer screening:  Due to Ms. Windhorst's history and her age, she should receive screening for skin cancers, colon cancer, and gynecologic cancers.  The information and recommendations are listed on the patient's comprehensive care plan/treatment summary and were reviewed in detail with the patient.    6. Health maintenance and wellness promotion: Ms. Marland was encouraged to consume 5-7 servings of fruits and vegetables per day. We reviewed the "Nutrition Rainbow" handout, as well as the handout "Take Control of Your Health and Reduce Your Cancer Risk" from the [employer].  She was also encouraged to engage in moderate to vigorous exercise for 30 minutes per day most days of the week. We discussed the [employer] fitness program, which is designed for cancer survivors to help them become more physically fit after cancer treatments.  She was  instructed to limit her alcohol consumption and continue to abstain from tobacco use.     7. Support services/counseling: It is not uncommon for this period of the patient's cancer care trajectory to be one of many emotions and stressors.  We discussed an opportunity for her to participate in the next session of [facility] ("Finding Your New Normal") support group series designed for patients after they have completed treatment.   Ms. Schumm was encouraged to take advantage of our many other support services programs, support groups, and/or counseling in coping with her new life as a cancer survivor after completing anti-cancer treatment.  She was offered support today through active listening and expressive supportive counseling.  She was given information regarding our available services and encouraged to contact me with any questions or for help enrolling in any of our support group/programs.    Dispo:   -Return to cancer center in 06/2018 for f/u with Dr. Burr Medico  -Mammogram due in 09/2018 -Bone density due 09/2018 -Follow up with Dr. Excell Seltzer --unsure -She is welcome to return back to the Survivorship Clinic at any time; no additional follow-up needed at this time.  -Consider referral back to survivorship as a long-term survivor for continued surveillance  A total of (30) minutes of face-to-face time was spent with this patient with greater than 50% of that time in counseling and care-coordination.   Gardenia Phlegm, NP Survivorship Program [place] [phone]   Note: PRIMARY CARE PROVIDER System, Pcp Not In None None

## 2018-01-02 ENCOUNTER — Telehealth: Payer: Self-pay | Admitting: Genetics

## 2018-01-02 ENCOUNTER — Encounter: Payer: BLUE CROSS/BLUE SHIELD | Admitting: Adult Health

## 2018-01-02 NOTE — Telephone Encounter (Signed)
Patient had questions about genetic testing.  She had seen an article and saw on [employer] new recommendations to test all patients with breast cancer regardless of family history.  We discussed that there are new studies that suggest this is appropriate and one professional society (ASBS) has recommended this within the past year.  However, other societies (ex NCCN) have not changed guidelines to test all breast cancer patients.  We discussed at this time it is somewhat of a 'controversial' topic and there is not a consensus as to weather testing is recommended for all breast cancer patients or not.    I told Nicole Schmitt, if she is interested in genetic testing, we are happy to see her for genetic counseling and genetic testing.  I did inform her that most insurance companies have not updated their policies to cover testing for all breast cancer patients (with no significant family history) so it may be difficult to get the test covered by insurance. The self pay cost is $250 for the genetic test.   Nicole Schmitt said she wanted to hold off on scheduling genetics at this time and wanted to discuss with Dr. Burr Medico when she saw her next in May 2020.  I provided Nicole Schmitt my contact information and the genetics general phone number if she has further questions about genetics or decides she wants to pursue genetic counseling/testing.

## 2018-02-25 ENCOUNTER — Other Ambulatory Visit: Payer: Self-pay | Admitting: Hematology

## 2018-02-25 DIAGNOSIS — Z170 Estrogen receptor positive status [ER+]: Principal | ICD-10-CM

## 2018-02-25 DIAGNOSIS — C50412 Malignant neoplasm of upper-outer quadrant of left female breast: Secondary | ICD-10-CM

## 2018-02-25 MED ORDER — LETROZOLE 2.5 MG PO TABS: 2.5000 mg | ORAL_TABLET | Freq: Every day | ORAL | 2 refills | Status: DC

## 2018-07-02 ENCOUNTER — Telehealth: Payer: Self-pay | Admitting: Hematology

## 2018-07-02 NOTE — Telephone Encounter (Signed)
Spoke with patient's husband, he asked me to call back tmrw. That she will be off and speak with me re: appt for 07/05/18

## 2018-07-03 ENCOUNTER — Other Ambulatory Visit: Payer: BLUE CROSS/BLUE SHIELD

## 2018-07-03 ENCOUNTER — Ambulatory Visit: Payer: BLUE CROSS/BLUE SHIELD | Admitting: Hematology

## 2018-07-04 ENCOUNTER — Telehealth: Payer: Self-pay | Admitting: Hematology

## 2018-07-04 NOTE — Progress Notes (Signed)
[place]   Telephone:([phone]) [phone] Fax:(336) [phone]   Clinic Follow up Note   Patient Care Team: System, Pcp Not In as PCP - General [name], MD as Consulting Physician (Hematology) Tyler Pita, MD as Consulting Physician (Radiation Oncology) Excell Seltzer, MD as Consulting Physician (General Surgery) Delice Bison Charlestine Massed, NP as Nurse Practitioner (Hematology and Oncology)   I connected with Nicole Schmitt on 07/05/2018 at  1:30 PM EDT by video enabled telemedicine visit and verified that I am speaking with the correct person using two identifiers.  I discussed the limitations, risks, security and privacy concerns of performing an evaluation and management service by telephone and the availability of in person appointments. I also discussed with the patient that there may be a patient responsible charge related to this service. The patient expressed understanding and agreed to proceed.   Patient's location:  Her home  Provider's location:  My Office   CHIEF COMPLAINT: Follow up left breast cancer  SUMMARY OF ONCOLOGIC HISTORY: Oncology History   Breast cancer of upper-outer quadrant of left female breast ([place])   Staging form: Breast, AJCC 7th Edition   - Pathologic: Stage IIA (T2, N0, cM0) - Unsigned      Breast cancer of upper-outer quadrant of left female breast ([place])   10/20/2015 Mammogram    Screening mammogram showed new nodular density on the left breast, measuring about 2 cm. the well defined nodules bilaterally are otherwise stable.    10/27/2015 Initial Biopsy    Left breast 2:00 position mass biopsy showed infiltrating ductal carcinoma.     10/27/2015 Initial Diagnosis    Breast cancer of upper-outer quadrant of left female breast ([place])    10/27/2015 Receptors her2    ER 100% positive, strong staining, PR 90% positive, strong staining, HER-2 IHC 2+, FISH negative.    11/16/2015 Surgery    Left breast lumpectomy and sentinel lymph  node biopsy.    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 Margins were negative. One sentinel lymph node was negative. Lymphovascular invasion was negative.    11/16/2015 Oncotype testing    RS 23, intermediate risk, which predicts 10 year risk of distant recurrence 15% with tamoxifen    12/23/2015 - 03/01/2016 Adjuvant Chemotherapy    Docetaxel and Cytoxan, every 3 weeks, with Neulasta on day 2, for 4 cycles    03/27/2016 - 05/10/2016 Radiation Therapy    Adjuvant breast radiation 03/27/16 - 05/10/16 60.4 Gy in 33 fractions to the left breast by Dr. Tammi Klippel.    05/31/2016 -  Anti-estrogen oral therapy    Letrozole 2.5 mg daily       CURRENT THERAPY:  Letrozole 2.5 mg, 1 tablet daily, started 05/31/16  INTERVAL HISTORY:  Nicole Schmitt is here for a follow up of left breast cancer. She was able to identify herself by face to face video. She notes she is doing well and stable. She notes she is still working part time with [employer].  She notes she is taking letrozole with mild joint aches which improves as the day goes. This is tolerable. She notes her neuropathy is still present in her feet and mildly in her hands at night. She notes she will see her PCP every 6 months. She notes her HTN has been well controlled.  She has joined my active life at [facility] to help her lose weight. She is also doing weight watchers. She  has been able to lose some weight.    REVIEW OF SYSTEMS:   Constitutional: Denies fevers, chills or abnormal weight loss Eyes: Denies blurriness of vision Ears, nose, mouth, throat, and face: Denies mucositis or sore throat Respiratory: Denies cough, dyspnea or wheezes Cardiovascular: Denies palpitation, chest discomfort or lower extremity swelling Gastrointestinal:  Denies nausea, heartburn or change in bowel habits Skin: Denies abnormal skin rashes MSK: (+) Joint aches, tolerable  Lymphatics:  Denies new lymphadenopathy or easy bruising Neurological: (+) neuropathy of feet and mildly of hands  Behavioral/Psych: Mood is stable, no new changes  All other systems were reviewed with the patient and are negative.  MEDICAL HISTORY:  Past Medical History:  Diagnosis Date  . Breast cancer ([place])   . Cancer ([place]) 1990   cervical   . Hypertension   . Personal history of chemotherapy    2017  . Personal history of radiation therapy    2018  . Seizures ([place])    > 20 years following fall from horse    SURGICAL HISTORY: Past Surgical History:  Procedure Laterality Date  . BREAST LUMPECTOMY Left 11/16/2015   Malignant  . BREAST LUMPECTOMY WITH RADIOACTIVE SEED AND SENTINEL LYMPH NODE BIOPSY Left 11/16/2015   Procedure: BREAST LUMPECTOMY WITH RADIOACTIVE SEED AND SENTINEL LYMPH NODE BIOPSY;  Surgeon: Excell Seltzer, MD;  Location: [facility];  Service: General;  Laterality: Left;  . CESAREAN SECTION    . COLONOSCOPY    . WISDOM TOOTH EXTRACTION      I have reviewed the social history and family history with the patient and they are unchanged from previous note.  ALLERGIES:  has No Known Allergies.  MEDICATIONS:  Current Outpatient Medications  Medication Sig Dispense Refill  . CLINDAGEL 1 % gel Apply 1 application topically daily as needed.     . hydrochlorothiazide (HYDRODIURIL) 25 MG tablet Take 25 mg by mouth daily. 12.5 mg to 25 mg dependent on bp medication    . letrozole (FEMARA) 2.5 MG tablet Take 1 tablet (2.5 mg total) by mouth daily. 90 tablet 3  . non-metallic deodorant (ALRA) MISC Apply 1 application topically daily as needed.    . valsartan (DIOVAN) 160 MG tablet Take 1 tablet (160 mg total) by mouth daily. 30 tablet 0   No current facility-administered medications for this visit.     PHYSICAL EXAMINATION: ECOG PERFORMANCE STATUS: 0 - Asymptomatic  No vitals taken today, Exam not performed today  LABORATORY DATA:  I have reviewed the data  as listed CBC Latest Ref Rng & Units 07/04/2017 03/05/2017 10/26/2016  WBC 3.9 - 10.3 K/uL 7.4 8.2 6.1  Hemoglobin 11.6 - 15.9 g/dL 13.8 12.8 13.5  Hematocrit 34.8 - 46.6 % 41.1 39.8 40.3  Platelets 145 - 400 K/uL 276 281 247     CMP Latest Ref Rng & Units 07/04/2017 03/05/2017 10/26/2016  Glucose 70 - 140 mg/dL 96 92 95  BUN 7 - 26 mg/dL 18 15 14.9  Creatinine 0.60 - 1.10 mg/dL 0.78 0.78 0.7  Sodium 136 - 145 mmol/L 140 140 142  Potassium 3.5 - 5.1 mmol/L 3.8 4.5 4.3  Chloride 98 - 109 mmol/L 105 106 -  CO2 22 - 29 mmol/L '27 26 25  ' Calcium 8.4 - 10.4 mg/dL 10.2 9.5 9.9  Total Protein 6.4 - 8.3 g/dL 7.4 7.2 7.0  Total Bilirubin 0.2 - 1.2 mg/dL 0.5 0.3 0.44  Alkaline Phos 40 - 150 U/L 124 119 113  AST 5 - 34 U/L  '17 13 15  ' ALT 0 - 55 U/L '18 17 15      ' RADIOGRAPHIC STUDIES: I have personally reviewed the radiological images as listed and agreed with the findings in the report. No results found.   ASSESSMENT & PLAN:  Nicole Schmitt is a 58 y.o. female with   1. 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 She is s/p left breast lumpectomy, and adjuvant radiation.  -she received adjuvant chemo TC for 4 cycles -She started anti-estrogen therapy with Letrozole in 05/2016. Tolerating well overall except mild arthralgia and stiffness, if this worsens we will consider exemestane.  -I discussed her letrozole can slow down her metabolism. I strongly encouraged her to watch her HgA1c, Cholesterol level and weight with PCP  -She is clinically doing well. She has recovered from treatment well except residual neuropathy of feet and mildly in her hands at night. Her mammogram 09/2017 was unremarkable. There is no clinical concern for recurrance.  -Continue Surveillance. Next mammogram in 09/2018.  -Continue letrozole  -F/u in 3-4 months   2. peripheral neuropathy,  grade 1 -Secondary to adjuvant chemotherapy -I previously encouraged her to gradually increase her activity levels, and try to exercise -She has mild neuropathy in feet and nightly in her hands, we'll continue observation.   3. HTN -She is on antihypertensive medication, including HCTZ -Follow-up of his primary care physician  4. Osteopenia -Her Bone density from 10/20/16 shows she is slightly osteopenic, her T-score -1.1 -I again suggested she takes calcium, vitamin D and exercise regularly.  -Next DEXA in 09/2018   5. Obesity  -I previously discussed how letrozole can cause her to gain weight. I encourage her to diet and exercise and to watch her weight.  -She will continue weight watchers and My Active life with [employer] to continue to work towards weight loss.   6. Arthralgia and muscle achiness in both arms and wrists -likely secondary to Letrozole, I previously discussed the side effects to her -I previously suggested she exercises and stretches as well as take over-the-counter joint medication and ibuprofen before bed.  -If the aches worsen I can switch her to exemestane or Tamoxifen.  -Improved and manageable.    PLAN -She is clinically doing well  -Continue letrozole, refilled today  -Mammogram and DEXA in 09/2018  -F/u in 3-4 months    No problem-specific Assessment & Plan notes found for this encounter.   Orders Placed This Encounter  Procedures  . MM DIAG BREAST TOMO BILATERAL    Standing Status:   Future    Standing Expiration Date:   07/05/2019    Order Specific Question:   Reason for Exam (SYMPTOM  OR DIAGNOSIS REQUIRED)    Answer:   screening    Order Specific Question:   Is the patient pregnant?    Answer:   No    Order Specific Question:   Preferred imaging location?    Answer:   [facility]  . DG Bone Density    Standing Status:   Future    Standing Expiration Date:   07/05/2019    Order Specific Question:   Reason for Exam (SYMPTOM  OR DIAGNOSIS  REQUIRED)    Answer:   screening    Order Specific Question:   Is the patient pregnant?    Answer:   No    Order Specific Question:  Preferred imaging location?    Answer:   [facility]   I discussed the assessment and treatment plan with the patient. The patient was provided an opportunity to ask questions and all were answered. The patient agreed with the plan and demonstrated an understanding of the instructions.  The patient was advised to call back or seek an in-person evaluation if the symptoms worsen or if the condition fails to improve as anticipated.  I provided 15 minutes of face-to-face video visit time during this encounter, and > 50% was spent counseling as documented under my assessment & plan.    [name], MD 07/05/2018   I, Joslyn Devon, am acting as scribe for [name], MD.   I have reviewed the above documentation for accuracy and completeness, and I agree with the above.

## 2018-07-04 NOTE — Telephone Encounter (Signed)
Spoke with patient and she agrees to [facility] for appt 07/05/18. Emailed step-by-step instructions to download and access [employer].

## 2018-07-05 ENCOUNTER — Inpatient Hospital Stay: Payer: BLUE CROSS/BLUE SHIELD | Attending: Hematology | Admitting: Hematology

## 2018-07-05 ENCOUNTER — Telehealth: Payer: Self-pay | Admitting: Hematology

## 2018-07-05 DIAGNOSIS — E2839 Other primary ovarian failure: Secondary | ICD-10-CM

## 2018-07-05 DIAGNOSIS — Z923 Personal history of irradiation: Secondary | ICD-10-CM | POA: Diagnosis not present

## 2018-07-05 DIAGNOSIS — Z79899 Other long term (current) drug therapy: Secondary | ICD-10-CM

## 2018-07-05 DIAGNOSIS — I10 Essential (primary) hypertension: Secondary | ICD-10-CM

## 2018-07-05 DIAGNOSIS — Z170 Estrogen receptor positive status [ER+]: Secondary | ICD-10-CM

## 2018-07-05 DIAGNOSIS — Z79811 Long term (current) use of aromatase inhibitors: Secondary | ICD-10-CM

## 2018-07-05 DIAGNOSIS — Z9221 Personal history of antineoplastic chemotherapy: Secondary | ICD-10-CM

## 2018-07-05 DIAGNOSIS — C50412 Malignant neoplasm of upper-outer quadrant of left female breast: Secondary | ICD-10-CM | POA: Diagnosis not present

## 2018-07-05 MED ORDER — LETROZOLE 2.5 MG PO TABS
2.5000 mg | ORAL_TABLET | Freq: Every day | ORAL | 3 refills | Status: DC
Start: 2018-07-05 — End: 2019-09-25

## 2018-07-05 NOTE — Telephone Encounter (Signed)
Scheduled appt per 5/8 los. ° °A calendar will be mailed out. °

## 2018-07-06 ENCOUNTER — Encounter: Payer: Self-pay | Admitting: Hematology

## 2018-08-26 ENCOUNTER — Other Ambulatory Visit: Payer: Self-pay

## 2018-08-26 ENCOUNTER — Emergency Department (HOSPITAL_COMMUNITY)
Admission: EM | Admit: 2018-08-26 | Discharge: 2018-08-26 | Disposition: A | Discharge status: Home / Self Care | Payer: PRIVATE HEALTH INSURANCE | Attending: Emergency Medicine | Admitting: Emergency Medicine

## 2018-08-26 ENCOUNTER — Emergency Department (HOSPITAL_COMMUNITY): Payer: PRIVATE HEALTH INSURANCE

## 2018-08-26 ENCOUNTER — Encounter (HOSPITAL_COMMUNITY): Payer: Self-pay

## 2018-08-26 DIAGNOSIS — S92355A Nondisplaced fracture of fifth metatarsal bone, left foot, initial encounter for closed fracture: Secondary | ICD-10-CM | POA: Diagnosis not present

## 2018-08-26 DIAGNOSIS — I10 Essential (primary) hypertension: Secondary | ICD-10-CM | POA: Diagnosis not present

## 2018-08-26 DIAGNOSIS — Y990 Civilian activity done for income or pay: Secondary | ICD-10-CM | POA: Diagnosis not present

## 2018-08-26 DIAGNOSIS — Z853 Personal history of malignant neoplasm of breast: Secondary | ICD-10-CM | POA: Insufficient documentation

## 2018-08-26 DIAGNOSIS — S99922A Unspecified injury of left foot, initial encounter: Secondary | ICD-10-CM | POA: Diagnosis present

## 2018-08-26 DIAGNOSIS — Y92239 Unspecified place in hospital as the place of occurrence of the external cause: Secondary | ICD-10-CM | POA: Insufficient documentation

## 2018-08-26 DIAGNOSIS — X500XXA Overexertion from strenuous movement or load, initial encounter: Secondary | ICD-10-CM | POA: Insufficient documentation

## 2018-08-26 DIAGNOSIS — Y939 Activity, unspecified: Secondary | ICD-10-CM | POA: Diagnosis not present

## 2018-08-26 DIAGNOSIS — Z79899 Other long term (current) drug therapy: Secondary | ICD-10-CM | POA: Diagnosis not present

## 2018-08-26 MED ORDER — HYDROCODONE-ACETAMINOPHEN 5-325 MG PO TABS: 1.0000 | ORAL_TABLET | Freq: Four times a day (QID) | ORAL | 0 refills | Status: DC | PRN

## 2018-08-26 NOTE — Progress Notes (Signed)
Orthopedic Tech Progress Note Patient Details:  Nicole Schmitt [birth year] [id number]  Ortho Devices Type of Ortho Device: CAM walker Ortho Device/Splint Interventions: Adjustment, Application, Ordered   Post Interventions Patient Tolerated: Well Instructions Provided: Poper ambulation with device, Care of device, Adjustment of device   Melony Overly T 08/26/2018, 7:08 PM

## 2018-08-26 NOTE — ED Notes (Signed)
Discharge instructions and prescription discussed with Pt. Pt verbalized understanding. Pt stable and ambulatory.    

## 2018-08-26 NOTE — ED Triage Notes (Signed)
Pt reports she was working on [employer] when she stepped on her pager and twisted her left ankle. Pt reports 4/10 pain relaxed with increased pain on wight bearing.

## 2018-08-26 NOTE — ED Provider Notes (Signed)
[facility] EMERGENCY DEPARTMENT Provider Note   CSN: [id number] Arrival date & time: 08/26/18  1553     History   Chief Complaint Chief Complaint  Patient presents with  . Ankle Pain    HPI Nicole Schmitt is a 58 y.o. female presenting for evaluation of left foot pain.  Patient states several hours prior to arrival she was at work when patient fell in her pocket, she actually stepped on it and sharply inverted her left foot.  Since then, she has had persistent pain, mostly the lateral aspect of her foot.  Pain is constant, worse with weightbearing.  She applied ice, but has not taken anything for pain including Tylenol or ibuprofen.  She denies injury elsewhere.  She not hit her head or lose consciousness.  She is not on blood thinners.  She reports a history of breast cancer, in remission.  She is on letrozole.  She also has a history of osteopenia and hypertension.     HPI  Past Medical History:  Diagnosis Date  . Breast cancer ([place])   . Cancer ([place]) 1990   cervical   . Hypertension   . Personal history of chemotherapy    2017  . Personal history of radiation therapy    2018  . Seizures ([place])    > 20 years following fall from horse    Patient Active Problem List   Diagnosis Date Noted  . Chemotherapy-induced peripheral neuropathy ([place]) 01/01/2018  . Lobar pneumonia, unspecified organism ([place]) 02/15/2016  . Febrile neutropenia ([place]) 02/12/2016  . Community acquired pneumonia of left lower lobe of lung ([place])   . Cutaneous abscess of right axilla   . HTN (hypertension) 01/13/2016  . Breast cancer of upper-outer quadrant of left female breast ([place]) 11/23/2015    Past Surgical History:  Procedure Laterality Date  . BREAST LUMPECTOMY Left 11/16/2015   Malignant  . BREAST LUMPECTOMY WITH RADIOACTIVE SEED AND SENTINEL LYMPH NODE BIOPSY Left 11/16/2015   Procedure: BREAST LUMPECTOMY WITH RADIOACTIVE SEED AND SENTINEL LYMPH NODE BIOPSY;  Surgeon:  Excell Seltzer, MD;  Location: [facility];  Service: General;  Laterality: Left;  . CESAREAN SECTION    . COLONOSCOPY    . WISDOM TOOTH EXTRACTION       OB History   No obstetric history on file.      Home Medications    Prior to Admission medications   Medication Sig Start Date End Date Taking? Authorizing Provider  CLINDAGEL 1 % gel Apply 1 application topically daily as needed.  10/25/15   [provider]  hydrochlorothiazide (HYDRODIURIL) 25 MG tablet Take 25 mg by mouth daily. 12.5 mg to 25 mg dependent on bp medication    [provider]  HYDROcodone-acetaminophen (NORCO/VICODIN) 5-325 MG tablet Take 1 tablet by mouth every 6 (six) hours as needed for severe pain. 08/26/18   Peniel Biel, PA-C  letrozole (FEMARA) 2.5 MG tablet Take 1 tablet (2.5 mg total) by mouth daily. 07/05/18   [name], MD  non-metallic deodorant Jethro Poling) MISC Apply 1 application topically daily as needed.    [provider]  valsartan (DIOVAN) 160 MG tablet Take 1 tablet (160 mg total) by mouth daily. 03/01/16   [name], MD    Family History Family History  Problem Relation Age of Onset  . Cancer Maternal Uncle        liver cancer/environmental exposure  . Cancer Paternal Uncle        liver  cancer/environmental exposure  . Epilepsy Mother   . Intracerebral hemorrhage Mother   . COPD Father   . Breast cancer Neg Hx     Social History Social History   Tobacco Use  . Smoking status: Never Smoker  . Smokeless tobacco: Never Used  Substance Use Topics  . Alcohol use: Yes    Comment: rare  . Drug use: No     Allergies   Patient has no known allergies.   Review of Systems Review of Systems  Musculoskeletal: Positive for arthralgias.  Hematological: Does not bruise/bleed easily.     Physical Exam Updated Vital Signs BP (!) 149/89 (BP Location: Right Arm)   Pulse 73   Temp 98 F (36.7 C) (Oral)   Resp 18   SpO2 99%   Physical Exam  Vitals signs and nursing note reviewed.  Constitutional:      General: She is not in acute distress.    Appearance: She is well-developed.     Comments: Appears nontoxic  HENT:     Head: Normocephalic and atraumatic.  Neck:     Musculoskeletal: Normal range of motion.  Pulmonary:     Effort: Pulmonary effort is normal.  Abdominal:     General: There is no distension.  Musculoskeletal: Normal range of motion.     Comments: Tenderness palpation of the lateral left foot over the fifth metatarsal.  No obvious deformity.  Pedal pulses intact bilaterally.  Good distal cap refill.  Full active range of motion of the ankle and toes.  Achilles tendon palpable and intact. Superficial skin abrasion of the right knee without bleeding.  Full active range of motion of the knee without pain.  Skin:    General: Skin is warm.     Capillary Refill: Capillary refill takes less than 2 seconds.     Findings: No rash.  Neurological:     Mental Status: She is alert and oriented to person, place, and time.      ED Treatments / Results  Labs (all labs ordered are listed, but only abnormal results are displayed) Labs Reviewed - No data to display  EKG    Radiology Dg Foot Complete Left  Result Date: 08/26/2018 CLINICAL DATA:  Fall today at work(here). Pain to 5th metatarsal area of left foot. EXAM: LEFT FOOT - COMPLETE 3+ VIEW COMPARISON:  None. FINDINGS: Small Achilles and calcaneal spurs. Transverse nondisplaced fracture at the base of the fifth metatarsal. IMPRESSION: Nondisplaced fracture at the base of the fifth metatarsal. Electronically Signed   By: Abigail Miyamoto M.D.   On: 08/26/2018 18:34    Procedures Procedures (including critical care time)  Medications Ordered in ED Medications - No data to display   Initial Impression / Assessment and Plan / ED Course  I have reviewed the triage vital signs and the nursing notes.  Pertinent labs & imaging results that were available during my  care of the patient were reviewed by me and considered in my medical decision making (see chart for details).        Patient presenting for evaluation of left foot pain.  Physical examination, she is neurovascularly intact.  Pain over the fifth metatarsal.  Will obtain x-rays for further evaluation.  X-rays viewed interpreted by me, shows nondisplaced fracture.  Discussed treatment with walking boot, pain control, rest, ice, elevation.  Encouraged follow-up with orthopedics. PMP checked, pt without recent controlled substance rx.  At this time, patient appears safe for discharge.  Return precautions given.  Patient states she understands and agrees to plan.   Final Clinical Impressions(s) / ED Diagnoses   Final diagnoses:  Closed nondisplaced fracture of fifth metatarsal bone of left foot, initial encounter    ED Discharge Orders         Ordered    HYDROcodone-acetaminophen (NORCO/VICODIN) 5-325 MG tablet  Every 6 hours PRN     08/26/18 1854           Franchot Heidelberg, PA-C 08/26/18 2056    Hayden Rasmussen, MD 08/27/18 [phone]

## 2018-08-26 NOTE — Discharge Instructions (Signed)
Take ibuprofen 3 times a day with meals.  Do not take other anti-inflammatories at the same time (Advil, Motrin, naproxen, Aleve). You may supplement with Tylenol if you need further pain control. Use ice packs for pain and swelling.  Keep your foot elevated.  Use norco as needed for severe or breakthrough pain. Have caution, this is a narcotic medicine. Do not drive or operate heavy machinery while taking this medicine.  Call the orthopedic doctor tomorrow to set up a follow up appointment.  Use the walking boot when walking.  Return to the ER if you develop severe worsening pain, numbness, or any new, worsening, or concerning symptoms.

## 2018-10-28 NOTE — Progress Notes (Signed)
[place]   Telephone:([phone]) [phone] Fax:(336) [phone]   Clinic Follow up Note   Patient Care Team: Andres Shad, MD as PCP - General (Family Medicine) [name], MD as Consulting Physician (Hematology) Tyler Pita, MD as Consulting Physician (Radiation Oncology) Excell Seltzer, MD as Consulting Physician (General Surgery) Delice Bison Charlestine Massed, NP as Nurse Practitioner (Hematology and Oncology)  Date of Service:  11/01/2018  CHIEF COMPLAINT:  Follow up left breast cancer  SUMMARY OF ONCOLOGIC HISTORY: Oncology History Overview Note  Breast cancer of upper-outer quadrant of left female breast [facility])   Staging form: Breast, AJCC 7th Edition   - Pathologic: Stage IIA (T2, N0, cM0) - Unsigned    Breast cancer of upper-outer quadrant of left female breast ([place])  10/20/2015 Mammogram   Screening mammogram showed new nodular density on the left breast, measuring about 2 cm. the well defined nodules bilaterally are otherwise stable.   10/27/2015 Initial Biopsy   Left breast 2:00 position mass biopsy showed infiltrating ductal carcinoma.    10/27/2015 Initial Diagnosis   Breast cancer of upper-outer quadrant of left female breast ([place])   10/27/2015 Receptors her2   ER 100% positive, strong staining, PR 90% positive, strong staining, HER-2 IHC 2+, FISH negative.   11/16/2015 Surgery   Left breast lumpectomy and sentinel lymph node biopsy.   11/16/2015 Pathology Results   Left breast lumpectomy showed invasive grade 3 ductal carcinoma, 2.2 cm, intermediate grade DCIS. Margins were negative. One sentinel lymph node was negative. Lymphovascular invasion was negative.   11/16/2015 Oncotype testing   RS 23, intermediate risk, which predicts 10 year risk of distant recurrence 15% with tamoxifen   12/23/2015 - 03/01/2016 Adjuvant Chemotherapy   Docetaxel and Cytoxan, every 3 weeks, with Neulasta on day 2, for 4 cycles   03/27/2016 - 05/10/2016 Radiation  Therapy   Adjuvant breast radiation 03/27/16 - 05/10/16 60.4 Gy in 33 fractions to the left breast by Dr. Tammi Klippel.   05/31/2016 -  Anti-estrogen oral therapy   Letrozole 2.5 mg daily       CURRENT THERAPY:  Letrozole 2.5 mg, 1 tablet daily, started 05/31/16  INTERVAL HISTORY:  DOMINIGUE Schmitt is here for a follow up of left breast cancer. She presents to the clinic alone. She notes she fractured her left foot in June. She hopes her boot comes off this month. She notes her joint pain improved but has moderate hot flashes. She is not interested in medication for her hot flashes. She notes she has been swimming in the summer. She notes she still has numbness in her hands and improved in her feet. She notes her PCP started her on Metformin but has not been compliant due to occasional diarrhea. She notes left breast cramping based on how she moves her arm.      REVIEW OF SYSTEMS:   Constitutional: Denies fevers, chills or abnormal weight loss (+) Moderate hot flashes  Eyes: Denies blurriness of vision Ears, nose, mouth, throat, and face: Denies mucositis or sore throat Respiratory: Denies cough, dyspnea or wheezes Cardiovascular: Denies palpitation, chest discomfort or lower extremity swelling Gastrointestinal:  Denies nausea, heartburn or change in bowel habits Skin: Denies abnormal skin rashes Lymphatics: Denies new lymphadenopathy or easy bruising Neurological: (+) Neuropathy in her hands and improved in her feet Behavioral/Psych: Mood is stable, no new changes  Breast: (+) Left breast cramping  All other systems were reviewed with the patient and are negative.  MEDICAL HISTORY:  Past Medical History:  Diagnosis Date  . Breast cancer ([place])   . Cancer ([place]) 1990   cervical   . Hypertension   . Personal history of chemotherapy    2017  . Personal history of radiation therapy    2018  . Seizures ([place])    > 20 years following fall from horse    SURGICAL HISTORY: Past Surgical  History:  Procedure Laterality Date  . BREAST LUMPECTOMY Left 11/16/2015   Malignant  . BREAST LUMPECTOMY WITH RADIOACTIVE SEED AND SENTINEL LYMPH NODE BIOPSY Left 11/16/2015   Procedure: BREAST LUMPECTOMY WITH RADIOACTIVE SEED AND SENTINEL LYMPH NODE BIOPSY;  Surgeon: Excell Seltzer, MD;  Location: [facility];  Service: General;  Laterality: Left;  . CESAREAN SECTION    . COLONOSCOPY    . WISDOM TOOTH EXTRACTION      I have reviewed the social history and family history with the patient and they are unchanged from previous note.  ALLERGIES:  has No Known Allergies.  MEDICATIONS:  Current Outpatient Medications  Medication Sig Dispense Refill  . CLINDAGEL 1 % gel Apply 1 application topically daily as needed.     . hydrochlorothiazide (HYDRODIURIL) 25 MG tablet Take 25 mg by mouth daily. 12.5 mg to 25 mg dependent on bp medication    . letrozole (FEMARA) 2.5 MG tablet Take 1 tablet (2.5 mg total) by mouth daily. 90 tablet 3  . losartan (COZAAR) 50 MG tablet Take 50 mg by mouth daily.    . non-metallic deodorant Jethro Poling) MISC Apply 1 application topically daily as needed.     No current facility-administered medications for this visit.     PHYSICAL EXAMINATION: ECOG PERFORMANCE STATUS: 1 - Symptomatic but completely ambulatory  Vitals:   11/01/18 0950  BP: 134/71  Pulse: 64  Resp: 17  Temp: 98.3 F (36.8 C)  SpO2: 99%   Filed Weights   11/01/18 0950  Weight: 259 lb 6.4 oz (117.7 kg)    GENERAL:alert, no distress and comfortable SKIN: skin color, texture, turgor are normal, no rashes or significant lesions EYES: normal, Conjunctiva are pink and non-injected, sclera clear  NECK: supple, thyroid normal size, non-tender, without nodularity LYMPH:  no palpable lymphadenopathy in the cervical, axillary  LUNGS: clear to auscultation and percussion with normal breathing effort HEART: regular rate & rhythm and no murmurs and no lower extremity edema  ABDOMEN:abdomen soft, non-tender and normal bowel sounds Musculoskeletal:no cyanosis of digits and no clubbing  NEURO: alert & oriented x 3 with fluent speech, no focal motor/sensory deficits BREAST: S/p left lumpectomy: Surgical incision healed well (+) 0.8cm scar tissue next to left breast surgical incision 6cm from nipple in 2-3:00 position (+) Mild skin hyperpigmentation from RT. No adenopathy bilaterally. Breast exam benign.   LABORATORY DATA:  I have reviewed the data as listed CBC Latest Ref Rng & Units 11/01/2018 07/04/2017 03/05/2017  WBC 4.0 - 10.5 K/uL 8.4 7.4 8.2  Hemoglobin 12.0 - 15.0 g/dL 13.2 13.8 12.8  Hematocrit 36.0 - 46.0 % 40.2 41.1 39.8  Platelets 150 - 400 K/uL 271 276 281     CMP Latest Ref Rng & Units 11/01/2018 07/04/2017 03/05/2017  Glucose 70 - 99 mg/dL 86 96 92  BUN 6 - 20 mg/dL _0 Creatinine 0.44 - 1.00 mg/dL 0.71 0.78 0.78  Sodium 135 - 145 mmol/L 141 140 140  Potassium 3.5 - 5.1 mmol/L 4.0 3.8 4.5  Chloride 98 - 111 mmol/L 107 105 106  CO2 22 - 32 mmol/L 25  27 26  Calcium 8.9 - 10.3 mg/dL 9.5 10.2 9.5  Total Protein 6.5 - 8.1 g/dL 6.9 7.4 7.2  Total Bilirubin 0.3 - 1.2 mg/dL 0.3 0.5 0.3  Alkaline Phos 38 - 126 U/L 106 124 119  AST 15 - 41 U/L _0 ALT 0 - 44 U/L _1 RADIOGRAPHIC STUDIES: I have personally reviewed the radiological images as listed and agreed with the findings in the report. No results found.   ASSESSMENT & PLAN:  Nicole Schmitt is a 58 y.o. female with   1. 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 She is s/p left breast lumpectomy, and adjuvant radiation.  -she received adjuvant chemo TC for 4 cycles -She started anti-estrogen therapy with Letrozole in 05/2016. Tolerating well overall except mild arthralgia and stiffness, if this worsens we will consider switching to  exemestane.  -I discussed her letrozole can slow down her metabolism. I strongly encouraged her to watch her HgA1c, Cholesterol level and weight with PCP  -She is clinically doing well. Lab reviewed, her CBC and CMP are within normal limits. Her physical exam was unremarkable except 0.8cm scar tissue of left breast. There is no clinical concern for recurrence. -She will have left breast pain depending on her arm position. This is related to surgery. I discussed her option of PT to help with this. She is interested, I will refer her.  -Continue Surveillance. Next mammogram in 10/2018  -Continue letrozole  -F/u in 8 months and then yearly. She will see her PCP in the interim  2. Peripheral neuropathy, grade 1 -Secondary to adjuvant chemotherapy -I previously encouraged her to gradually increase her activity levels, and try to exercise -Her neuropathy in feet has improved but still mild and mostly in her hands, we'll continue observation.   3. HTN -She is on antihypertensive medication, including HCTZ -Follow-up of his primary care physician  4. Osteopenia -Her Bone density from 10/20/16 shows she is slightly osteopenic, her T-score -1.1 -Iagainsuggestedshe takes calcium, vitamin D and exercise regularly.  -Next DEXA on 11/11/18  5. Obesity   -Ipreviouslydiscussed how letrozole can cause her to gain weight. I encourage her to diet and exercise and to watch her weight.  -She will continue weight watchers and My Active life with [facility] to continue to work towards weight loss. -Her PCP started her on Metformin 1 month ago. She has not been compliant but willing to seriously try it.    6. Arthralgia and muscle achiness in both arms and wrists, Hot flashes  -likely secondary to Letrozole  -Ipreviouslysuggestedshe exercises and stretches as well as take over-the-counter joint medication and ibuprofen before bed.  -If the aches worsen I can switch her to exemestane or Tamoxifen.  -Improved and manageable.  -Her hot flashes are moderate to significant. I offered medication to help with this. She declined for now.    PLAN -She is clinically doing well  -Continue letrozole  -Mammogram and DEXA in 11/11/2018 -F/u in 8 months  -PT referral    No problem-specific Assessment & Plan notes found for this encounter.   Orders Placed This Encounter  Procedures  . Ambulatory referral to Physical Therapy    Referral Priority:   Routine    Referral Type:   Physical Medicine    Referral Reason:   Specialty Services Required  Requested Specialty:   Physical Therapy    Number of Visits Requested:   1   All questions were answered. The patient knows to call the clinic with any problems, questions or concerns. No barriers to learning was detected. I spent 15 minutes counseling the patient face to face. The total time spent in the appointment was 20 minutes and more than 50% was on counseling and review of test results     [name], MD 11/01/2018   I, Joslyn Devon, am acting as scribe for [name], MD.   I have reviewed the above documentation for accuracy and completeness, and I agree with the above.

## 2018-11-01 ENCOUNTER — Other Ambulatory Visit: Payer: Self-pay

## 2018-11-01 ENCOUNTER — Inpatient Hospital Stay: Payer: BC Managed Care – PPO | Admitting: Hematology

## 2018-11-01 ENCOUNTER — Encounter: Payer: Self-pay | Admitting: Hematology

## 2018-11-01 ENCOUNTER — Telehealth: Payer: Self-pay | Admitting: Hematology

## 2018-11-01 ENCOUNTER — Inpatient Hospital Stay: Payer: BC Managed Care – PPO | Attending: Hematology

## 2018-11-01 VITALS — BP 134/71 | HR 64 | Temp 98.3°F | Resp 17 | Ht 65.0 in | Wt 259.4 lb

## 2018-11-01 DIAGNOSIS — I10 Essential (primary) hypertension: Secondary | ICD-10-CM | POA: Diagnosis not present

## 2018-11-01 DIAGNOSIS — R232 Flushing: Secondary | ICD-10-CM | POA: Diagnosis not present

## 2018-11-01 DIAGNOSIS — Z79811 Long term (current) use of aromatase inhibitors: Secondary | ICD-10-CM | POA: Insufficient documentation

## 2018-11-01 DIAGNOSIS — M8580 Other specified disorders of bone density and structure, unspecified site: Secondary | ICD-10-CM | POA: Insufficient documentation

## 2018-11-01 DIAGNOSIS — Z170 Estrogen receptor positive status [ER+]: Secondary | ICD-10-CM | POA: Insufficient documentation

## 2018-11-01 DIAGNOSIS — Z79899 Other long term (current) drug therapy: Secondary | ICD-10-CM | POA: Insufficient documentation

## 2018-11-01 DIAGNOSIS — C50412 Malignant neoplasm of upper-outer quadrant of left female breast: Secondary | ICD-10-CM | POA: Diagnosis not present

## 2018-11-01 DIAGNOSIS — Z9221 Personal history of antineoplastic chemotherapy: Secondary | ICD-10-CM | POA: Diagnosis not present

## 2018-11-01 DIAGNOSIS — M25539 Pain in unspecified wrist: Secondary | ICD-10-CM | POA: Insufficient documentation

## 2018-11-01 DIAGNOSIS — E669 Obesity, unspecified: Secondary | ICD-10-CM | POA: Insufficient documentation

## 2018-11-01 DIAGNOSIS — Z923 Personal history of irradiation: Secondary | ICD-10-CM | POA: Insufficient documentation

## 2018-11-01 DIAGNOSIS — G629 Polyneuropathy, unspecified: Secondary | ICD-10-CM | POA: Diagnosis not present

## 2018-11-01 DIAGNOSIS — N951 Menopausal and female climacteric states: Secondary | ICD-10-CM | POA: Insufficient documentation

## 2018-11-01 DIAGNOSIS — N644 Mastodynia: Secondary | ICD-10-CM | POA: Insufficient documentation

## 2018-11-01 LAB — CBC WITH DIFFERENTIAL/PLATELET
Abs Immature Granulocytes: 0.01 10*3/uL (ref 0.00–0.07)
Basophils Absolute: 0 10*3/uL (ref 0.0–0.1)
Basophils Relative: 1 %
Eosinophils Absolute: 0.1 10*3/uL (ref 0.0–0.5)
Eosinophils Relative: 1 %
HCT: 40.2 % (ref 36.0–46.0)
Hemoglobin: 13.2 g/dL (ref 12.0–15.0)
Immature Granulocytes: 0 %
Lymphocytes Relative: 33 %
Lymphs Abs: 2.8 10*3/uL (ref 0.7–4.0)
MCH: 28.8 pg (ref 26.0–34.0)
MCHC: 32.8 g/dL (ref 30.0–36.0)
MCV: 87.6 fL (ref 80.0–100.0)
Monocytes Absolute: 0.6 10*3/uL (ref 0.1–1.0)
Monocytes Relative: 7 %
Neutro Abs: 4.9 10*3/uL (ref 1.7–7.7)
Neutrophils Relative %: 58 %
Platelets: 271 10*3/uL (ref 150–400)
RBC: 4.59 MIL/uL (ref 3.87–5.11)
RDW: 13.2 % (ref 11.5–15.5)
WBC: 8.4 10*3/uL (ref 4.0–10.5)
nRBC: 0 % (ref 0.0–0.2)

## 2018-11-01 LAB — COMPREHENSIVE METABOLIC PANEL
ALT: 14 U/L (ref 0–44)
AST: 15 U/L (ref 15–41)
Albumin: 3.7 g/dL (ref 3.5–5.0)
Alkaline Phosphatase: 106 U/L (ref 38–126)
Anion gap: 9 (ref 5–15)
BUN: 15 mg/dL (ref 6–20)
CO2: 25 mmol/L (ref 22–32)
Calcium: 9.5 mg/dL (ref 8.9–10.3)
Chloride: 107 mmol/L (ref 98–111)
Creatinine, Ser: 0.71 mg/dL (ref 0.44–1.00)
GFR calc Af Amer: >60 mL/min (ref >60)
GFR calc non Af Amer: >60 mL/min (ref >60)
Glucose, Bld: 86 mg/dL (ref 70–99)
Potassium: 4 mmol/L (ref 3.5–5.1)
Sodium: 141 mmol/L (ref 135–145)
Total Bilirubin: 0.3 mg/dL (ref 0.3–1.2)
Total Protein: 6.9 g/dL (ref 6.5–8.1)

## 2018-11-01 NOTE — Telephone Encounter (Signed)
Scheduled appointment per 09/04 los, patient received after visit summary and will be reminded of appointments by My chart.

## 2018-11-11 ENCOUNTER — Ambulatory Visit
Admission: RE | Admit: 2018-11-11 | Discharge: 2018-11-11 | Disposition: A | Discharge status: Home / Self Care | Payer: BC Managed Care – PPO | Source: Ambulatory Visit | Attending: Hematology | Admitting: Hematology

## 2018-11-11 ENCOUNTER — Other Ambulatory Visit: Payer: Self-pay

## 2018-11-11 DIAGNOSIS — E2839 Other primary ovarian failure: Secondary | ICD-10-CM

## 2018-11-11 DIAGNOSIS — C50412 Malignant neoplasm of upper-outer quadrant of left female breast: Secondary | ICD-10-CM

## 2018-11-11 DIAGNOSIS — Z170 Estrogen receptor positive status [ER+]: Secondary | ICD-10-CM

## 2018-11-12 ENCOUNTER — Telehealth: Payer: Self-pay

## 2018-11-12 NOTE — Telephone Encounter (Signed)
Spoke with patient and informed her per Dr. Burr Medico of the results from her resent DEXA scan. Encouraged her to continue taking a calcium and vitD supplement. Patient verbalized understanding and agreement. Patient verbalized no other questions/concerns.

## 2018-11-12 NOTE — Telephone Encounter (Signed)
-----   Message from Jonnie Finner, RN sent at 11/12/2018  8:59 AM EDT -----  ----- Message ----- From: [name], MD Sent: 11/11/2018   5:06 PM EDT To: Jonnie Finner, RN  Please let pt know her DEXA scan, which is similar to 2018, osteopenia with same T-score, no high risk for fracture, continue VitD and calcium supplement, thanks   [name]  11/11/2018

## 2018-11-14 ENCOUNTER — Ambulatory Visit: Payer: BC Managed Care – PPO | Attending: Hematology

## 2018-11-14 ENCOUNTER — Other Ambulatory Visit: Payer: Self-pay

## 2018-11-14 DIAGNOSIS — Z170 Estrogen receptor positive status [ER+]: Secondary | ICD-10-CM | POA: Diagnosis present

## 2018-11-14 DIAGNOSIS — M25512 Pain in left shoulder: Secondary | ICD-10-CM | POA: Diagnosis present

## 2018-11-14 DIAGNOSIS — C50412 Malignant neoplasm of upper-outer quadrant of left female breast: Secondary | ICD-10-CM

## 2018-11-14 DIAGNOSIS — G8929 Other chronic pain: Secondary | ICD-10-CM | POA: Insufficient documentation

## 2018-11-14 DIAGNOSIS — R293 Abnormal posture: Secondary | ICD-10-CM | POA: Insufficient documentation

## 2018-11-14 NOTE — Therapy (Signed)
[place], [place], [address] Phone: [phone]   Fax:  [phone]  Physical Therapy Evaluation  Patient Details  Name: Nicole Schmitt MRN: [id number] Date of Birth: Dec 23, 1960 Referring Provider (PT): [name]   Encounter Date: 11/14/2018    Past Medical History:  Diagnosis Date  . Breast cancer ([place])   . Cancer ([place]) 1990   cervical   . Hypertension   . Personal history of chemotherapy    2017  . Personal history of radiation therapy    2018  . Seizures ([place])    > 20 years following fall from horse    Past Surgical History:  Procedure Laterality Date  . BREAST LUMPECTOMY Left 11/16/2015   Malignant  . BREAST LUMPECTOMY WITH RADIOACTIVE SEED AND SENTINEL LYMPH NODE BIOPSY Left 11/16/2015   Procedure: BREAST LUMPECTOMY WITH RADIOACTIVE SEED AND SENTINEL LYMPH NODE BIOPSY;  Surgeon: Excell Seltzer, MD;  Location: [facility];  Service: General;  Laterality: Left;  . CESAREAN SECTION    . COLONOSCOPY    . WISDOM TOOTH EXTRACTION      There were no vitals filed for this visit.   Subjective Assessment - 11/14/18 0853    Currently in Pain?  Yes    Pain Score  5     Pain Location  Axilla    Pain Orientation  Left    Pain Descriptors / Indicators  Burning;Shooting;Cramping    Pain Type  Chronic pain;Surgical pain    Pain Onset  More than a month ago    Pain Frequency  Occasional    Aggravating Factors   Lifting arm, driving and sometimes just while sitting and relaxing.    Pain Relieving Factors  moving arm position         [facility] PT Assessment - 11/14/18 0001      Assessment   Referring Provider (PT)  [name]    Onset Date/Surgical Date  11/16/15    Hand Dominance  Right    Next MD Visit  [id number]    Prior Therapy  No      Precautions   Precautions  Other (comment)   Cancer, hormone therapy     Balance Screen   Has the patient fallen in the past 6  months  Yes    How many times?  1    Has the patient had a decrease in activity level because of a fear of falling?   No    Is the patient reluctant to leave their home because of a fear of falling?   No      Home Social worker  Private residence    Living Arrangements  Spouse/significant other;Children;Parent    Available Help at Discharge  Family    Type of [place]      Prior Function   Level of [place]  Part time employment    Vocation Requirements  pt is a Cabin crew ski, boat, paddle board, kayaking, swim      Cognition   Overall Cognitive Status  Within Functional Limits for tasks assessed      Observation/Other Assessments   Observations  Dense tissue noted inferior to the L areola in previous surgical site, tightness noted of the lateral incision for sentinal node removal    Skin Integrity  Skin warm, dry and intact      Sensation  Light Touch  Appears Intact   Per pt report decreased senstaion/sleeping sensation     ROM / Strength   AROM / PROM / Strength  AROM      AROM   AROM Assessment Site  Shoulder    Right/Left Shoulder  Right;Left    Right Shoulder Flexion  162 Degrees    Right Shoulder ABduction  150 Degrees    Right Shoulder Internal Rotation  63 Degrees    Right Shoulder External Rotation  90 Degrees    Left Shoulder Flexion  144 Degrees    Left Shoulder ABduction  134 Degrees    Left Shoulder Internal Rotation  65 Degrees    Left Shoulder External Rotation  70 Degrees        LYMPHEDEMA/ONCOLOGY QUESTIONNAIRE - 11/14/18 0844      Surgeries   Lumpectomy Date  11/16/15      Treatment   Active Chemotherapy Treatment  No    Past Chemotherapy Treatment  Yes    Date  02/28/16    Active Radiation Treatment  No    Past Radiation Treatment  Yes    Body Site  [id number]    Current Hormone Treatment  Yes    Date  05/28/16    Drug Name  Letrazole      What other symptoms do you have    Are you Having Heaviness or Tightness  No    Are you having Pain  Yes    Are you having pitting edema  No    Body Site  L axilla    Is it Hard or Difficult finding clothes that fit  No    Do you have infections  No      Lymphedema Assessments   Lymphedema Assessments  Upper extremities      Right Upper Extremity Lymphedema   10 cm Proximal to Olecranon Process  46.9 cm    Olecranon Process  31.5 cm    15 cm Proximal to Ulnar Styloid Process  30 cm    10 cm Proximal to Ulnar Styloid Process  27.4 cm    Just Proximal to Ulnar Styloid Process  17.9 cm    Across Hand at [employer]  20.9 cm    At [place] of 2nd Digit  7 cm      Left Upper Extremity Lymphedema   10 cm Proximal to Olecranon Process  45.7 cm    Olecranon Process  28.7 cm    15 cm Proximal to Ulnar Styloid Process  28.6 cm    10 cm Proximal to Ulnar Styloid Process  26.8 cm    Just Proximal to Ulnar Styloid Process  18 cm    Across Hand at [employer]  20.5 cm    At [place] of 2nd Digit  7 cm             Outpatient Rehab from [facility]14/2018 [facility]  Lymphedema Life Impact Scale Total Score  13.24 %      Objective measurements completed on examination: See above findings.      [place] [facility] Adult PT Treatment/Exercise - 11/14/18 0001      Exercises   Exercises  Shoulder      Shoulder Exercises: Seated   Retraction  10 reps    Retraction Limitations  5 s hold w/VC to squeeze mid/lower trap and prevent shoulder elevation w/over use of the upper trapezius.       Shoulder Exercises: Stretch  Other Shoulder Stretches  Shoulder flexion counter stretch 10x 5 seconds w/VC for light stretching and maintaining wgt off of arms.     Other Shoulder Stretches  Pec stretch door way with emphasis on using the anterior leg to keep wgt off of the UE to stretch the anterior chest wall w/o pain. And door way stretch to address rhomboids/lats to decrease cramping in axillary area with focus  w/VC to not use UE to hold up weight.                PT Short Term Goals - 11/14/18 1314      PT SHORT TERM GOAL #1   Title  Pt will improve L shoulder flexion/abduction to 150 degrees within 3 weeks in order to improve functional mobility.    Baseline  Flexion: 144, abduction 134    Time  3    Period  Weeks    Status  New    Target Date  12/12/18        PT Long Term Goals - 11/14/18 1316      PT LONG TERM GOAL #1   Title  Pt will report 2/10 pain in the L shoulder infrequently within 6 weeks in order to demonstrate improved quality of life through decreased pain.    Baseline  5/10 occasionally    Time  6    Period  Weeks    Status  New    Target Date  01/02/19      PT LONG TERM GOAL #2   Title  Pt will score a 0% on the lymphedema life impact survey within 6 weeks in order to demonstrate a better understanding of the risk and precautions related to lymphedema following breast surgery and radiation within 6 weeks.    Baseline  Pt does not understand risks related to radiation in regards to lymphedema.    Time  6    Period  Weeks    Status  New    Target Date  01/02/19             Plan - 11/14/18 1303    Clinical Impression Statement  Pt presents to physical therapy post L lumpectomy with radioactive seed placement and sentinel node biopsy on 10/2015 due to diagnosis of malignant neoplasm of the L upper outer quadrant estrogen receptor positive in female. She had a course of radiation for 36 treatments and chemotherapy that she finished in the spring of 2018. She states that since that time she has been experiencing a cramping pain in her L shoulder/axillary area that improves with movement and massage. She has had no previous physical therapy and has done no exercises post lumpectomy. She demonstrates decreased ROM in her L GHJ compared to her R. Dense scar tissue noted in the L breast and decreased scar mobility. Pt will benefit from skilled physical therapy  services to address the above limitations.    Personal Factors and Comorbidities  Comorbidity 1;Profession    Comorbidities  Cancer    Examination-Activity Limitations  Lift;Carry    Examination-Participation Restrictions  Cleaning;Laundry    Stability/Clinical Decision Making  Stable/Uncomplicated    Clinical Decision Making  Low    Rehab Potential  Excellent    PT Frequency  1x / week    PT Duration  6 weeks    PT Treatment/Interventions  ADLs/Self Care Home Management;Cryotherapy;Scientist, product/process development;Functional mobility training;Therapeutic activities;Therapeutic exercise;Neuromuscular re-education;Patient/family education;Manual techniques;Manual lymph drainage;Compression bandaging;Passive range of motion;Taping    PT Next Visit Plan  Provide w/foam for dense area in the L breast    PT Home Exercise Plan  See pt instructions for shoulder stretches.       Patient will benefit from skilled therapeutic intervention in order to improve the following deficits and impairments:  Increased muscle spasms, Improper body mechanics, Decreased scar mobility, Pain  Visit Diagnosis: Malignant neoplasm of upper-outer quadrant of left breast in female, estrogen receptor positive ([facility])  Chronic left shoulder pain  Abnormal posture     Problem List Patient Active Problem List   Diagnosis Date Noted  . Chemotherapy-induced peripheral neuropathy ([place]) 01/01/2018  . Lobar pneumonia, unspecified organism ([place]) 02/15/2016  . Febrile neutropenia ([place]) 02/12/2016  . Community acquired pneumonia of left lower lobe of lung ([place])   . Cutaneous abscess of right axilla   . HTN (hypertension) 01/13/2016  . Breast cancer of upper-outer quadrant of left female breast ([place]) 11/23/2015    Gretel Acre Laporsha Grealish,PT 11/14/2018, 1:27 PM  [place], [place], [address] Phone: [phone]   Fax:  [phone]  Name:  Nicole Schmitt MRN: [id number] Date of Birth: 01-20-1961

## 2018-11-14 NOTE — Patient Instructions (Signed)
Access Code: [email or web address]  URL: [email or web address]  Date: 11/14/2018  Prepared by: Tomma Rakers

## 2018-11-20 ENCOUNTER — Other Ambulatory Visit: Payer: Self-pay

## 2018-11-20 ENCOUNTER — Ambulatory Visit: Payer: BC Managed Care – PPO

## 2018-11-20 DIAGNOSIS — M25512 Pain in left shoulder: Secondary | ICD-10-CM

## 2018-11-20 DIAGNOSIS — G8929 Other chronic pain: Secondary | ICD-10-CM

## 2018-11-20 DIAGNOSIS — C50412 Malignant neoplasm of upper-outer quadrant of left female breast: Secondary | ICD-10-CM

## 2018-11-20 DIAGNOSIS — R293 Abnormal posture: Secondary | ICD-10-CM

## 2018-11-20 NOTE — Therapy (Signed)
[place] [employer], [place], [address] Phone: [phone]   Fax:  [phone]  Physical Therapy Treatment  Patient Details  Name: Nicole Schmitt MRN: [id number] Date of Birth: 03-11-1960 Referring Provider (PT): [name]   Encounter Date: 11/20/2018  PT End of Session - 11/20/18 1557    Visit Number  2    Number of Visits  7    Date for PT Re-Evaluation  01/01/19    PT Start Time  1500    PT Stop Time  [id number]    PT Time Calculation (min)  48 min    Activity Tolerance  Patient tolerated treatment well    Behavior During Therapy  [facility] for tasks assessed/performed       Past Medical History:  Diagnosis Date  . Breast cancer ([place])   . Cancer ([place]) 1990   cervical   . Hypertension   . Personal history of chemotherapy    2017  . Personal history of radiation therapy    2018  . Seizures ([place])    > 20 years following fall from horse    Past Surgical History:  Procedure Laterality Date  . BREAST LUMPECTOMY Left 11/16/2015   Malignant  . BREAST LUMPECTOMY WITH RADIOACTIVE SEED AND SENTINEL LYMPH NODE BIOPSY Left 11/16/2015   Procedure: BREAST LUMPECTOMY WITH RADIOACTIVE SEED AND SENTINEL LYMPH NODE BIOPSY;  Surgeon: Excell Seltzer, MD;  Location: [facility];  Service: General;  Laterality: Left;  . CESAREAN SECTION    . COLONOSCOPY    . WISDOM TOOTH EXTRACTION      There were no vitals filed for this visit.  Subjective Assessment - 11/20/18 1520    Subjective  Pt reports that she is experiencing trouble performing the pec stretch at the door. She states that her other exercises are going well and she has no increase in pain.    Currently in Pain?  Yes    Pain Score  1     Pain Location  Axilla    Pain Orientation  Left    Pain Descriptors / Indicators  Tightness    Pain Onset  More than a month ago    Pain Frequency  Intermittent    Aggravating Factors   lifting arm, driving and  sometimes just sitting and relaxing    Pain Relieving Factors  moving arm                  Outpatient Re[facility] from [facility]14/2018 [facility]  Lymphedema Life Impact Scale Total Score  13.24 %           [facility] Adult PT Treatment/Exercise - 11/20/18 0001      Exercises   Exercises  Shoulder      Shoulder Exercises: Supine   External Rotation  AAROM;10 reps    External Rotation Limitations  with dowel, instructed to not reproduce symptoms. Pt educated to keep her elbow tucked at her side in order to decrease compensation w/abduction.     Flexion  AAROM;10 reps    Flexion Limitations  with dowel instruction to note duplicate symptoms, 5 second hold      Shoulder Exercises: Seated   Retraction  10 reps    Retraction Limitations   2 sets 5 s hold w/VC to squeeze mid/lower trap and prevent shoulder elevation w/over use of the upper trapezius.       Shoulder Exercises: Stretch   Wall Stretch - Flexion  5  reps    Wall Stretch - Flexion Limitations  2 sets of 5, with 5 second hold, Instruction for leaning into flexion stretch to get end range of motion while avoiding pain    Other Shoulder Stretches  Shoulder flexion counter stretch 10x 5 seconds w/VC for light stretching and maintaining wgt off of arms.     Other Shoulder Stretches  Pec stretch in supine with hands behind head 30 seconds, education on the importance of  low load long duration for mucle relaxation and to prevent shoulder hiking during stretch.       Manual Therapy   Manual Therapy  Passive ROM    Manual therapy comments  Pt requires oscillations for relaxation during PROM. Pt has greatest stretch in the pectoralis major while stretching into external rotation w/o an increase in pain from baseline.     Passive ROM  5x in flexion, abduction, ER and IR.              PT Education - 11/20/18 1542    Education Details  Access Code: [email or web address], Pt instructed to add new exercises  and stop doorway pec stretch/rhomboid stretch due to she was not feeling  stretch with these. Pt was provided w/1/2 inch gray foam for scar tissue softening and instructed to wear as tolerated for fluid removal and tissue softening.    Person(s) Educated  Patient    Methods  Explanation;Demonstration;Verbal cues;Tactile cues    Comprehension  Verbalized understanding;Returned demonstration       PT Short Term Goals - 11/14/18 1314      PT SHORT TERM GOAL #1   Title  Pt will improve L shoulder flexion/abduction to 150 degrees within 3 weeks in order to improve functional mobility.    Baseline  Flexion: 144, abduction 134    Time  3    Period  Weeks    Status  New    Target Date  12/12/18        PT Long Term Goals - 11/14/18 1316      PT LONG TERM GOAL #1   Title  Pt will report 2/10 pain in the L shoulder infrequently within 6 weeks in order to demonstrate improved quality of life through decreased pain.    Baseline  5/10 occasionally    Time  6    Period  Weeks    Status  New    Target Date  01/02/19      PT LONG TERM GOAL #2   Title  Pt will score a 0% on the lymphedema life impact survey within 6 weeks in order to demonstrate a better understanding of the risk and precautions related to lymphedema following breast surgery and radiation within 6 weeks.    Baseline  Pt does not understand risks related to radiation in regards to lymphedema.    Time  6    Period  Weeks    Status  New    Target Date  01/02/19            Plan - 11/20/18 1559    Clinical Impression Statement  Pt presents to physical therapy today w/no increase in pain from baseline. She continues with tightness/stiffness in the L shoulder that improved following L shoulder PROM with over pressure and oscillations for fluid exchange. Pt was able to tolerate an increase in AAROM/AROM ther-ex for the shoulder without an increase in pain. Pt requires intermittent VC to avoid pain during ther-ex and to decrease B  shoulder hiking.  Pt was provided w/dense 1/2 inch gray foam this session for dense tissue in the L breast and to assist with fluid flow in the L UQ. Pt will benefit from continued physical therapy services. Continue with current POC.    Personal Factors and Comorbidities  Comorbidity 1;Profession    Comorbidities  Cancer    Examination-Activity Limitations  Lift;Carry    Examination-Participation Restrictions  Cleaning;Laundry    PT Frequency  1x / week    PT Duration  6 weeks    PT Treatment/Interventions  ADLs/Self Care Home Management;Cryotherapy;Scientist, product/process development;Functional mobility training;Therapeutic activities;Therapeutic exercise;Neuromuscular re-education;Patient/family education;Manual techniques;Manual lymph drainage;Compression bandaging;Passive range of motion;Taping    PT Next Visit Plan  Assess foam for dense area in the L breast, Assess new HEP and begin education for MLD.    PT Home Exercise Plan  See pt Education for shoulder stretches.       Patient will benefit from skilled therapeutic intervention in order to improve the following deficits and impairments:  Increased muscle spasms, Improper body mechanics, Decreased scar mobility, Pain  Visit Diagnosis: Malignant neoplasm of upper-outer quadrant of left breast in female, estrogen receptor positive (H[facility])  Chronic left shoulder pain  Abnormal posture     Problem List Patient Active Problem List   Diagnosis Date Noted  . Chemotherapy-induced peripheral neuropathy ([place]) 01/01/2018  . Lobar pneumonia, unspecified organism ([place]) 02/15/2016  . Febrile neutropenia ([place]) 02/12/2016  . Community acquired pneumonia of left lower lobe of lung ([place])   . Cutaneous abscess of right axilla   . HTN (hypertension) 01/13/2016  . Breast cancer of upper-outer quadrant of left female breast ([place]) 11/23/2015    Ander Purpura, PT 11/20/2018, 4:06 PM  [place], [place], [address] Phone: [phone]   Fax:  [phone]  Name: KIMMORA RAKER MRN: [id number] Date of Birth: Jul 10, 1960

## 2018-11-27 ENCOUNTER — Other Ambulatory Visit: Payer: Self-pay

## 2018-11-27 ENCOUNTER — Ambulatory Visit: Payer: BC Managed Care – PPO

## 2018-11-27 DIAGNOSIS — G8929 Other chronic pain: Secondary | ICD-10-CM

## 2018-11-27 DIAGNOSIS — C50412 Malignant neoplasm of upper-outer quadrant of left female breast: Secondary | ICD-10-CM

## 2018-11-27 DIAGNOSIS — M25512 Pain in left shoulder: Secondary | ICD-10-CM

## 2018-11-27 DIAGNOSIS — Z170 Estrogen receptor positive status [ER+]: Secondary | ICD-10-CM

## 2018-11-27 DIAGNOSIS — R293 Abnormal posture: Secondary | ICD-10-CM

## 2018-11-27 NOTE — Patient Instructions (Signed)
Access Code: [email or web address]  URL: [email or web address]  Date: 11/27/2018  Prepared by: Tomma Rakers   Exercises Scapular Retraction with Resistance - 10 reps - 2 sets - 1x daily - 7x weekly Shoulder extension with resistance - Neutral - 10 reps - 3 sets - 1x daily - 7x weekly Supine Chest Stretch with Elbows Bent - 3 reps - 1 sets - 30-60 seconds hold - 1x daily - 7x weekly Supine Shoulder Flexion with Dowel - 10 reps - 1 sets - 5 seconds hold - 1x daily - 7x weekly Shoulder Flexion Wall Slide with Towel - 10 reps - 1 sets - 5 seconds hold - 1x daily - 7x weekly Standing Shoulder Abduction Finger Walk at Wall - 10 reps - 1 sets - 5 seconds hold - 1x daily - 7x weekly Sidelying Shoulder External Rotation AROM - 10 reps - 2 sets - 1x daily - 7x weekly

## 2018-11-27 NOTE — Therapy (Addendum)
[place], [place], [address] Phone: [phone]   Fax:  [phone]  Physical Therapy Treatment  Patient Details  Name: Nicole Schmitt MRN: [id number] Date of Birth: 17-Aug-1960 Referring Provider (PT): [name]   Encounter Date: 11/27/2018  PT End of Session - 11/27/18 1309    Visit Number  3    Number of Visits  7    Date for PT Re-Evaluation  01/01/19    PT Start Time  1300    PT Stop Time  1355    PT Time Calculation (min)  55 min    Activity Tolerance  Patient tolerated treatment well    Behavior During Therapy  [facility] for tasks assessed/performed       Past Medical History:  Diagnosis Date  . Breast cancer ([place])   . Cancer ([place]) 1990   cervical   . Hypertension   . Personal history of chemotherapy    2017  . Personal history of radiation therapy    2018  . Seizures ([place])    > 20 years following fall from horse    Past Surgical History:  Procedure Laterality Date  . BREAST LUMPECTOMY Left 11/16/2015   Malignant  . BREAST LUMPECTOMY WITH RADIOACTIVE SEED AND SENTINEL LYMPH NODE BIOPSY Left 11/16/2015   Procedure: BREAST LUMPECTOMY WITH RADIOACTIVE SEED AND SENTINEL LYMPH NODE BIOPSY;  Surgeon: Excell Seltzer, MD;  Location: [facility];  Service: General;  Laterality: Left;  . CESAREAN SECTION    . COLONOSCOPY    . WISDOM TOOTH EXTRACTION      There were no vitals filed for this visit.  Subjective Assessment - 11/27/18 1304    Subjective  Pt states she has been wearing gray foam she has some stiffness in her shoulders especially her L shoulder today. Her exercises are going well but she got a little confused with the pec stretch and wants to go over that. She states she was very busy over the weekend with no increase in pain.    Currently in Pain?  Yes    Pain Score  2     Pain Location  Axilla    Pain Orientation  Left    Pain Descriptors / Indicators   Tightness    Pain Type  Chronic pain;Surgical pain    Pain Onset  More than a month ago    Pain Frequency  Intermittent    Aggravating Factors   lifting arm, driving and sometimes just sitting and relaxing.    Pain Relieving Factors  moving arm                  Outpatient Rehab from 11/14/2018 in [facility]  Lymphedema Life Impact Scale Total Score  13.24 %           [facility] Adult PT Treatment/Exercise - 11/27/18 0001      Shoulder Exercises: Supine   External Rotation  AAROM;10 reps    External Rotation Limitations  with dowel, towel roll provided for tactile input to prevent abduction compensation will change to doorway stretch this session due to pt has difficulty with this exercise at home    Flexion  AAROM;10 reps    Flexion Limitations  2 sets, w/dowel 5 second hold. discussed the difference between R/L ROM pt has almost full ROM with this activity at this time.       Shoulder Exercises: Seated   Extension  Strengthening;Both;10 reps  Theraband Level (Shoulder Extension)  Level 1 (Yellow)    Extension Limitations  2 sets, VC and demonstration for correct form to adress the lower trapezius with scapular squeeze and release throughout movement.     Retraction  Strengthening;10 reps;Both    Theraband Level (Shoulder Retraction)  Level 1 (Yellow)    Retraction Limitations  2 sets demonstration for correct form to prevent compensation w/triceps. Pt required 1x VC to decrease shoulder elevation.       Shoulder Exercises: Sidelying   External Rotation  10 reps    External Rotation Limitations  2 sets towel roll for tactile cueing to prevent compensation with abd and VC for getting full ROM.       Shoulder Exercises: Stretch   Wall Stretch - Flexion Limitations  10x with 5 second hold. Discussed stretching into comfort.     Wall Stretch - ABduction Limitations  10x with 5 seconds hold. Pt was instructed to not force into pain but to  get an easy stretch.     Other Shoulder Stretches  Pec stretch in supine continued due to pt is reporting best stretch in this position. 2x 20 seconds VC for length due to pt holds for 5 seconds or less.       Manual Therapy   Manual Therapy  Soft tissue mobilization;Myofascial release    Soft tissue mobilization  Very light STM to the B pecs medial and at the insertion on the humerus with significant decrease in tightness/tenderness following light STM    Myofascial Release  Myofascial release over the pectoralis major w/cross hand friction massage lightly performed with significant increase in skin mobility following myofascial release             PT Education - 11/27/18 1333    Education Details  Access Code: [email or web address]. Pt instructed to perform updated HEP at home.    Person(s) Educated  Patient    Methods  Explanation;Demonstration;Tactile cues;Verbal cues;Handout    Comprehension  Verbalized understanding;Returned demonstration       PT Short Term Goals - 11/14/18 1314      PT SHORT TERM GOAL #1   Title  Pt will improve L shoulder flexion/abduction to 150 degrees within 3 weeks in order to improve functional mobility.    Baseline  Flexion: 144, abduction 134    Time  3    Period  Weeks    Status  New    Target Date  12/12/18        PT Long Term Goals - 11/14/18 1316      PT LONG TERM GOAL #1   Title  Pt will report 2/10 pain in the L shoulder infrequently within 6 weeks in order to demonstrate improved quality of life through decreased pain.    Baseline  5/10 occasionally    Time  6    Period  Weeks    Status  New    Target Date  01/02/19      PT LONG TERM GOAL #2   Title  Pt will score a 0% on the lymphedema life impact survey within 6 weeks in order to demonstrate a better understanding of the risk and precautions related to lymphedema following breast surgery and radiation within 6 weeks.    Baseline  Pt does not understand risks related to radiation in regards  to lymphedema.    Time  6    Period  Weeks    Status  New    Target Date  01/02/19  Plan - 11/27/18 1310    Clinical Impression Statement  Pt presents to physical thearpy this session with improving ROM in her B shoulders. She is able to almost fully get both elbows to the table during pec stretch and is able to get almost full ROM with shoulder AAROM stretch using the dowel into flexion. Palpable tightness/tenderness noted in the pectoralis major L>R and tightness noted in the pectoralis fascia; decreased following very light STM w/decreased pain reported. Pt will benefit from continued POC.    Personal Factors and Comorbidities  Comorbidity 1;Profession    Comorbidities  Cancer    Examination-Activity Limitations  Lift;Carry    Examination-Participation Restrictions  Cleaning;Laundry    PT Frequency  1x / week    PT Duration  6 weeks    PT Treatment/Interventions  ADLs/Self Care Home Management;Cryotherapy;Scientist, product/process development;Functional mobility training;Therapeutic activities;Therapeutic exercise;Neuromuscular re-education;Patient/family education;Manual techniques;Manual lymph drainage;Compression bandaging;Passive range of motion;Taping    PT Next Visit Plan  Teach self MLD. Assess new HEP.    PT Home Exercise Plan  Access Code: [email or web address], Pt educated to continue to wear dense foam.    Consulted and Agree with Plan of Care  Patient       Patient will benefit from skilled therapeutic intervention in order to improve the following deficits and impairments:  Increased muscle spasms, Improper body mechanics, Decreased scar mobility, Pain  Visit Diagnosis: Malignant neoplasm of upper-outer quadrant of left breast in female, estrogen receptor positive ([place])  Chronic left shoulder pain  Abnormal posture     Problem List Patient Active Problem List   Diagnosis Date Noted  . Chemotherapy-induced peripheral neuropathy ([place]) 01/01/2018  . Lobar  pneumonia, unspecified organism ([place]) 02/15/2016  . Febrile neutropenia ([place]) 02/12/2016  . Community acquired pneumonia of left lower lobe of lung ([place])   . Cutaneous abscess of right axilla   . HTN (hypertension) 01/13/2016  . Breast cancer of upper-outer quadrant of left female breast ([place]) 11/23/2015    Ander Purpura, PT 11/27/2018, 2:01 PM  [place], [place], [address] Phone: [phone]   Fax:  [phone]  Name: GABRIEL PROCELL MRN: [id number] Date of Birth: 1961/01/19

## 2018-12-04 ENCOUNTER — Ambulatory Visit: Payer: BC Managed Care – PPO | Attending: Hematology

## 2018-12-04 ENCOUNTER — Other Ambulatory Visit: Payer: Self-pay

## 2018-12-04 DIAGNOSIS — M25512 Pain in left shoulder: Secondary | ICD-10-CM | POA: Insufficient documentation

## 2018-12-04 DIAGNOSIS — G8929 Other chronic pain: Secondary | ICD-10-CM | POA: Diagnosis present

## 2018-12-04 DIAGNOSIS — R293 Abnormal posture: Secondary | ICD-10-CM | POA: Insufficient documentation

## 2018-12-04 DIAGNOSIS — Z170 Estrogen receptor positive status [ER+]: Secondary | ICD-10-CM | POA: Insufficient documentation

## 2018-12-04 DIAGNOSIS — C50412 Malignant neoplasm of upper-outer quadrant of left female breast: Secondary | ICD-10-CM | POA: Diagnosis not present

## 2018-12-04 NOTE — Therapy (Signed)
[place], [place], [address] Phone: [phone]   Fax:  [phone]  Physical Therapy Treatment  Patient Details  Name: Nicole Schmitt MRN: [id number] Date of Birth: [birth year] Referring Provider (PT): [name]   Encounter Date: 12/04/2018  PT End of Session - 12/04/18 0852    Visit Number  4    Number of Visits  7    Date for PT Re-Evaluation  01/01/19    PT Start Time  0802    PT Stop Time  0848    PT Time Calculation (min)  46 min    Activity Tolerance  Patient tolerated treatment well    Behavior During Therapy  [facility] for tasks assessed/performed       Past Medical History:  Diagnosis Date  . Breast cancer ([place])   . Cancer ([place]) 1990   cervical   . Hypertension   . Personal history of chemotherapy    2017  . Personal history of radiation therapy    2018  . Seizures ([place])    > 20 years following fall from horse    Past Surgical History:  Procedure Laterality Date  . BREAST LUMPECTOMY Left 11/16/2015   Malignant  . BREAST LUMPECTOMY WITH RADIOACTIVE SEED AND SENTINEL LYMPH NODE BIOPSY Left 11/16/2015   Procedure: BREAST LUMPECTOMY WITH RADIOACTIVE SEED AND SENTINEL LYMPH NODE BIOPSY;  Surgeon: Excell Seltzer, MD;  Location: [facility];  Service: General;  Laterality: Left;  . CESAREAN SECTION    . COLONOSCOPY    . WISDOM TOOTH EXTRACTION      There were no vitals filed for this visit.  Subjective Assessment - 12/04/18 0805    Subjective  I 've been doing the stretches Tye [place] gave me last time and they are goin well. I don't have any questions about those. I have an appt with my orthopedist for my Lt foot that I had broke. I'm hoping he releases today. I've been wearing the foam she gave me in my bra and I can't tell if it's helping or not.    Currently in Pain?  No/denies                  Outpatient Re[facility] from [facility]14/[facility]  Lymphedema Life Impact Scale Total Score  13.24 %           [facility] Adult PT Treatment/Exercise - 12/04/18 0001      Manual Therapy   Manual Therapy  Manual Lymphatic Drainage (MLD);Myofascial release;Passive ROM;Scapular mobilization    Soft tissue mobilization  To Lt periscapular area where pt c/o tightness    Myofascial Release  To Lt axilla during P/ROM     Scapular Mobilization  When in Rt S/L for Lt scapular mobs into protraction and retraction     Manual Lymphatic Drainage (MLD)  In Supine: Short neck, Rt axilla and Lt inguinal node, anterior inter-axillary and Lt axillo-inguinal anastomosis, then focused on Lt breast, then into Rt S/L for further work along [employer] axillo-inguinal anastomosis and lateral breast briefly, then finished in supine retracing all steps beginning to instruct pt throughout    Passive ROM  In Supine to Lt shoulder into flexion and abduction to pts end ROM, tissue tightness limits end motions               PT Short Term Goals - 11/14/18 1314      PT SHORT TERM GOAL #1   Title  Pt will improve L shoulder flexion/abduction to 150 degrees within 3 weeks in order to improve functional mobility.    Baseline  Flexion: 144, abduction 134    Time  3    Period  Weeks    Status  New    Target Date  12/12/18        PT Long Term Goals - 11/14/18 1316      PT LONG TERM GOAL #1   Title  Pt will report 2/10 pain in the L shoulder infrequently within 6 weeks in order to demonstrate improved quality of life through decreased pain.    Baseline  5/10 occasionally    Time  6    Period  Weeks    Status  New    Target Date  01/02/19      PT LONG TERM GOAL #2   Title  Pt will score a 0% on the lymphedema life impact survey within 6 weeks in order to demonstrate a better understanding of the risk and precautions related to lymphedema following breast surgery and radiation within 6 weeks.    Baseline  Pt does not understand risks  related to radiation in regards to lymphedema.    Time  6    Period  Weeks    Status  New    Target Date  01/02/19            Plan - 12/04/18 0853    Clinical Impression Statement  First session of manual lymph drainage to Lt breast along with continuing manual therapy to focus on improving pts end ROM. No fibrosis or fullness palpated at lateral breast today and pt reports this has been better. She has been wearing the compression foam which has most likely helped decrease the dense tissue here. Pt still with limitiations at Lt shoulder end P/ROM in flexion and abduction and will benefit from further physical therapy to cont to address these defecits.    Personal Factors and Comorbidities  Comorbidity 1;Profession    Comorbidities  Cancer    Examination-Activity Limitations  Lift;Carry    Examination-Participation Restrictions  Cleaning;Laundry    Stability/Clinical Decision Making  Stable/Uncomplicated    Rehab Potential  Excellent    PT Frequency  1x / week    PT Duration  6 weeks    PT Treatment/Interventions  ADLs/Self Care Home Management;Cryotherapy;Scientist, product/process development;Functional mobility training;Therapeutic activities;Therapeutic exercise;Neuromuscular re-education;Patient/family education;Manual techniques;Manual lymph drainage;Compression bandaging;Passive range of motion;Taping    PT Next Visit Plan  Instruct in self MLD prn (no firmness was palapble today though) . Assess new HEP and focus on promoting improved end ROM ot Lt shoulder    PT Home Exercise Plan  Access Code: [email or web address], Pt educated to continue to wear dense foam.    Consulted and Agree with Plan of Care  Patient       Patient will benefit from skilled therapeutic intervention in order to improve the following deficits and impairments:  Increased muscle spasms, Improper body mechanics, Decreased scar mobility, Pain  Visit Diagnosis: Malignant neoplasm of upper-outer quadrant of left breast  in female, estrogen receptor positive ([place])  Chronic left shoulder pain  Abnormal posture     Problem List Patient Active Problem List   Diagnosis Date Noted  . Chemotherapy-induced peripheral neuropathy ([place]) 01/01/2018  . Lobar pneumonia, unspecified organism ([place]) 02/15/2016  . Febrile neutropenia ([place]) 02/12/2016  . Community acquired pneumonia of left lower lobe of lung   . Cutaneous abscess of right axilla   .  HTN (hypertension) 01/13/2016  . Breast cancer of upper-outer quadrant of left female breast ([place]) 11/23/2015    Otelia Limes, PTA 12/04/2018, 9:02 AM  [place], [place], [address] Phone: [phone]   Fax:  [phone]  Name: GUY CANET MRN: [id number] Date of Birth: 1960/09/09

## 2018-12-11 ENCOUNTER — Encounter: Payer: BC Managed Care – PPO | Admitting: Rehabilitation

## 2018-12-19 ENCOUNTER — Ambulatory Visit: Payer: BC Managed Care – PPO | Admitting: Physical Therapy

## 2018-12-19 ENCOUNTER — Encounter: Payer: Self-pay | Admitting: Physical Therapy

## 2018-12-19 ENCOUNTER — Other Ambulatory Visit: Payer: Self-pay

## 2018-12-19 DIAGNOSIS — G8929 Other chronic pain: Secondary | ICD-10-CM

## 2018-12-19 DIAGNOSIS — C50412 Malignant neoplasm of upper-outer quadrant of left female breast: Secondary | ICD-10-CM | POA: Diagnosis not present

## 2018-12-19 DIAGNOSIS — R293 Abnormal posture: Secondary | ICD-10-CM

## 2018-12-19 DIAGNOSIS — M25512 Pain in left shoulder: Secondary | ICD-10-CM

## 2018-12-19 NOTE — Therapy (Signed)
[place], [place], [address] Phone: [phone]   Fax:  [phone]  Physical Therapy Treatment  Patient Details  Name: Nicole Schmitt MRN: [id number] Date of Birth: 1960/08/30 Referring Provider (PT): [name]   Encounter Date: 12/19/2018  PT End of Session - 12/19/18 0850    Visit Number  5    Number of Visits  7    Date for PT Re-Evaluation  01/01/19    PT Start Time  0805    PT Stop Time  0850    PT Time Calculation (min)  45 min    Activity Tolerance  Patient tolerated treatment well    Behavior During Therapy  [facility] for tasks assessed/performed       Past Medical History:  Diagnosis Date  . Breast cancer ([place])   . Cancer ([place]) 1990   cervical   . Hypertension   . Personal history of chemotherapy    2017  . Personal history of radiation therapy    2018  . Seizures ([place])    > 20 years following fall from horse    Past Surgical History:  Procedure Laterality Date  . BREAST LUMPECTOMY Left 11/16/2015   Malignant  . BREAST LUMPECTOMY WITH RADIOACTIVE SEED AND SENTINEL LYMPH NODE BIOPSY Left 11/16/2015   Procedure: BREAST LUMPECTOMY WITH RADIOACTIVE SEED AND SENTINEL LYMPH NODE BIOPSY;  Surgeon: Excell Seltzer, MD;  Location: [facility];  Service: General;  Laterality: Left;  . CESAREAN SECTION    . COLONOSCOPY    . WISDOM TOOTH EXTRACTION      There were no vitals filed for this visit.  Subjective Assessment - 12/19/18 0806    Subjective  My shoulder is feeling better. My shoulder does not hurt quite as much. I am not quite sure how the breast swelling is doing.    Currently in Pain?  No/denies    Pain Score  0-No pain                  Outpatient Rehab from 11/14/2018 in [facility]  Lymphedema Life Impact Scale Total Score  13.24 %           [facility] Adult PT Treatment/Exercise - 12/19/18 0001      Manual  Therapy   Soft tissue mobilization  To Lt periscapular area where pt c/o tightness    Myofascial Release  To Lt axilla during P/ROM     Manual Lymphatic Drainage (MLD)  In Supine: Short neck, 5 diaphragmatic breaths, Rt axilla and Lt inguinal node, anterior inter-axillary and Lt axillo-inguinal anastomosis, then focused on Lt breast, then retraced all steps    Passive ROM  In Supine to Lt shoulder into flexion and abduction to pts end ROM, tissue tightness limits end motions               PT Short Term Goals - 11/14/18 1314      PT SHORT TERM GOAL #1   Title  Pt will improve L shoulder flexion/abduction to 150 degrees within 3 weeks in order to improve functional mobility.    Baseline  Flexion: 144, abduction 134    Time  3    Period  Weeks    Status  New    Target Date  12/12/18        PT Long Term Goals - 11/14/18 1316      PT LONG TERM GOAL #1   Title  Pt will  report 2/10 pain in the L shoulder infrequently within 6 weeks in order to demonstrate improved quality of life through decreased pain.    Baseline  5/10 occasionally    Time  6    Period  Weeks    Status  New    Target Date  01/02/19      PT LONG TERM GOAL #2   Title  Pt will score a 0% on the lymphedema life impact survey within 6 weeks in order to demonstrate a better understanding of the risk and precautions related to lymphedema following breast surgery and radiation within 6 weeks.    Baseline  Pt does not understand risks related to radiation in regards to lymphedema.    Time  6    Period  Weeks    Status  New    Target Date  01/02/19            Plan - 12/19/18 [id number]    Clinical Impression Statement  Continued with MLD to left breast today. Worked on areas of scar tissue and fibrosis at left lateral breast. Pt is still limited at end range of shoulder ROM but this improved with PROM today. Performed soft tissue mobilization to left periscapular area where pt had increased muscle tightness which  did improve some after STM.    PT Frequency  1x / week    PT Duration  6 weeks    PT Treatment/Interventions  ADLs/Self Care Home Management;Cryotherapy;Scientist, product/process development;Functional mobility training;Therapeutic activities;Therapeutic exercise;Neuromuscular re-education;Patient/family education;Manual techniques;Manual lymph drainage;Compression bandaging;Passive range of motion;Taping    PT Next Visit Plan  Instruct in self MLD prn (no firmness was palapble today though) . Assess new HEP and focus on promoting improved end ROM ot Lt shoulder    PT Home Exercise Plan  Access Code: [email or web address], Pt educated to continue to wear dense foam.    Consulted and Agree with Plan of Care  Patient       Patient will benefit from skilled therapeutic intervention in order to improve the following deficits and impairments:  Increased muscle spasms, Improper body mechanics, Decreased scar mobility, Pain  Visit Diagnosis: Chronic left shoulder pain  Abnormal posture     Problem List Patient Active Problem List   Diagnosis Date Noted  . Chemotherapy-induced peripheral neuropathy ([place]) 01/01/2018  . Lobar pneumonia, unspecified organism ([place]) 02/15/2016  . Febrile neutropenia ([place]) 02/12/2016  . Community acquired pneumonia of left lower lobe of lung   . Cutaneous abscess of right axilla   . HTN (hypertension) 01/13/2016  . Breast cancer of upper-outer quadrant of left female breast ([place]) 11/23/2015    Allyson Sabal [facility] 12/19/2018, 8:54 AM  [place], [place], [address] Phone: [phone]   Fax:  [phone]  Name: Nicole Schmitt MRN: [id number] Date of Birth: Jun 06, 1960  Manus Gunning, PT 12/19/18 8:54 AM

## 2018-12-25 ENCOUNTER — Ambulatory Visit: Payer: BC Managed Care – PPO

## 2018-12-25 ENCOUNTER — Other Ambulatory Visit: Payer: Self-pay

## 2018-12-25 DIAGNOSIS — G8929 Other chronic pain: Secondary | ICD-10-CM

## 2018-12-25 DIAGNOSIS — R293 Abnormal posture: Secondary | ICD-10-CM

## 2018-12-25 DIAGNOSIS — C50412 Malignant neoplasm of upper-outer quadrant of left female breast: Secondary | ICD-10-CM | POA: Diagnosis not present

## 2018-12-25 DIAGNOSIS — Z170 Estrogen receptor positive status [ER+]: Secondary | ICD-10-CM

## 2018-12-25 DIAGNOSIS — M25512 Pain in left shoulder: Secondary | ICD-10-CM

## 2018-12-25 NOTE — Therapy (Signed)
[place], [place], [address] Phone: [phone]   Fax:  [phone]  Physical Therapy Treatment  Patient Details  Name: Nicole Schmitt MRN: [id number] Date of Birth: 01-05-1961 Referring Provider (PT): [name]   Encounter Date: 12/25/2018  PT End of Session - 12/25/18 1101    Visit Number  6    Number of Visits  7    Date for PT Re-Evaluation  01/01/19    PT Start Time  1102    PT Stop Time  1153    PT Time Calculation (min)  51 min    Activity Tolerance  Patient tolerated treatment well    Behavior During Therapy  [facility] for tasks assessed/performed       Past Medical History:  Diagnosis Date  . Breast cancer ([place])   . Cancer ([place]) 1990   cervical   . Hypertension   . Personal history of chemotherapy    2017  . Personal history of radiation therapy    2018  . Seizures ([place])    > 20 years following fall from horse    Past Surgical History:  Procedure Laterality Date  . BREAST LUMPECTOMY Left 11/16/2015   Malignant  . BREAST LUMPECTOMY WITH RADIOACTIVE SEED AND SENTINEL LYMPH NODE BIOPSY Left 11/16/2015   Procedure: BREAST LUMPECTOMY WITH RADIOACTIVE SEED AND SENTINEL LYMPH NODE BIOPSY;  Surgeon: Excell Seltzer, MD;  Location: [facility];  Service: General;  Laterality: Left;  . CESAREAN SECTION    . COLONOSCOPY    . WISDOM TOOTH EXTRACTION      There were no vitals filed for this visit.  Subjective Assessment - 12/25/18 1007    Subjective  I can tell my Lt shoulder and breast are improving, especially the shoulder.    Currently in Pain?  No/denies                  Outpatient [facility]14/[facility]  Lymphedema Life Impact Scale Total Score  13.24 %           [facility] Adult PT Treatment/Exercise - 12/25/18 0001      Manual Therapy   Soft tissue mobilization  To Lt periscapular area where pt c/o  tightness    Myofascial Release  To Lt axilla during P/ROM     Manual Lymphatic Drainage (MLD)  In Supine: Short neck, 5 diaphragmatic breaths, Rt axilla and Lt inguinal node, anterior inter-axillary and Lt axillo-inguinal anastomosis, then focused on Lt breast, then retraced all steps    Passive ROM  In Supine to Lt shoulder into flexion and abduction to pts end ROM, tissue tightness limits end motions               PT Short Term Goals - 11/14/18 1314      PT SHORT TERM GOAL #1   Title  Pt will improve L shoulder flexion/abduction to 150 degrees within 3 weeks in order to improve functional mobility.    Baseline  Flexion: 144, abduction 134    Time  3    Period  Weeks    Status  New    Target Date  12/12/18        PT Long Term Goals - 12/25/18 1010      PT LONG TERM GOAL #1   Title  Pt will report 2/10 pain in the L shoulder infrequently within 6 weeks in order to demonstrate improved quality of life  through decreased pain.    Baseline  5/10 occasionally; only up to 3/10 intermittently now - 12/25/18    Status  On-going            Plan - 12/25/18 1351    Clinical Impression Statement  Continued with MLD to left breast along with myofascial release at end P/ROM at axilla. Also continued with sofft tissue mobilization to Lt periscapular area. All areas were much improved today and discussed with pt to consider whether she feels ready for D/C at next session or if she wants to renew but decrease freq. Pt verbalized understanding.    Personal Factors and Comorbidities  Comorbidity 1;Profession    Comorbidities  Cancer    Examination-Activity Limitations  Lift;Carry    Examination-Participation Restrictions  Cleaning;Laundry    Stability/Clinical Decision Making  Stable/Uncomplicated    Rehab Potential  Excellent    PT Frequency  1x / week    PT Duration  6 weeks    PT Treatment/Interventions  ADLs/Self Care Home Management;Cryotherapy;Electronics engineer;Functional mobility training;Therapeutic activities;Therapeutic exercise;Neuromuscular re-education;Patient/family education;Manual techniques;Manual lymph drainage;Compression bandaging;Passive range of motion;Taping    PT Next Visit Plan  Review self MLD prn and new HEP. Consider D/C or renewal at next session.    PT Home Exercise Plan  Access Code: [email or web address], Pt educated to continue to wear dense foam.    Consulted and Agree with Plan of Care  Patient       Patient will benefit from skilled therapeutic intervention in order to improve the following deficits and impairments:  Increased muscle spasms, Improper body mechanics, Decreased scar mobility, Pain  Visit Diagnosis: Chronic left shoulder pain  Abnormal posture  Malignant neoplasm of upper-outer quadrant of left breast in female, estrogen receptor positive ([place])     Problem List Patient Active Problem List   Diagnosis Date Noted  . Chemotherapy-induced peripheral neuropathy ([place]) 01/01/2018  . Lobar pneumonia, unspecified organism ([place]) 02/15/2016  . Febrile neutropenia ([place]) 02/12/2016  . Community acquired pneumonia of left lower lobe of lung   . Cutaneous abscess of right axilla   . HTN (hypertension) 01/13/2016  . Breast cancer of upper-outer quadrant of left female breast ([place]) 11/23/2015    Nicole Schmitt, PTA 12/25/2018, 1:57 PM  [place], [place], [address] Phone: [phone]   Fax:  [phone]  Name: Nicole Schmitt MRN: [id number] Date of Birth: 11/11/1960

## 2019-01-03 ENCOUNTER — Ambulatory Visit: Payer: BC Managed Care – PPO | Attending: Hematology

## 2019-01-03 ENCOUNTER — Other Ambulatory Visit: Payer: Self-pay

## 2019-01-03 DIAGNOSIS — G8929 Other chronic pain: Secondary | ICD-10-CM | POA: Insufficient documentation

## 2019-01-03 DIAGNOSIS — Z170 Estrogen receptor positive status [ER+]: Secondary | ICD-10-CM | POA: Diagnosis present

## 2019-01-03 DIAGNOSIS — R293 Abnormal posture: Secondary | ICD-10-CM | POA: Insufficient documentation

## 2019-01-03 DIAGNOSIS — C50412 Malignant neoplasm of upper-outer quadrant of left female breast: Secondary | ICD-10-CM | POA: Diagnosis present

## 2019-01-03 DIAGNOSIS — M25512 Pain in left shoulder: Secondary | ICD-10-CM | POA: Diagnosis not present

## 2019-01-03 NOTE — Patient Instructions (Addendum)
Manual Lymph Drainage for Left Breast.  Do daily.  Do slowly. Use flat hands with just enough pressure to stretch the skin. Do not slide over the skin, but move the skin with the hand you're using. Lie down or sit comfortably (in a recliner, for example) to do this.  Deep Effective Breath   Standing, sitting, or laying down, place both hands on the belly. Take a deep breath IN, expanding the belly; then breath OUT, contracting the belly. Repeat __5__ times. Do __2-3__ sessions per day and before your self massage.  http://gt2.exer.us/866   Copyright  VHI. All rights reserved.  Axilla to Axilla - Sweep   On both sides make 5 circles in the armpit, then pump _5__ times from involved armpit across chest to uninvolved armpit, making a pathway. Do _1__ time per day.  Copyright  VHI. All rights reserved.  Axilla to Inguinal Nodes - Sweep   On both sides, make 5 circles at groin at panty line, then pump _5__ times from armpit along side of trunk to outer hip, making your other pathway. Do __1_ time per day.    Draw an imaginary diagonal line from upper outer breast through the nipple area toward lower inner breast.  Direct fluid upward and inward from this line toward the pathway across your upper chest (established in #5).  Do this in three rows to treat all of the upper inner breast tissue, and do each row 3-4x.  Then repeat axilla to axilla sweep 5x   Direct fluid to treat all of lower outer breast tissue downward and outward toward pathway established in #6 that is aimed at the left groin.  Then repeat axilla to inguinal node sweep   Copyright  VHI. All rights reserved.  Arm Posterior: Elbow to Shoulder - Sweep   Pump _5__ times from back of elbow to top of shoulder. Then inner to outer upper arm _5_ times, then outer arm again _5_ times. Then back to the pathways _2-3_ times. Do _1__ time per day.  Copyright  VHI. All rights reserved.  ARM: [place] Wrist to Elbow -  Sweep   Pump or stationary circles _5__ times from wrist to elbow making sure to do both sides of the forearm. Then retrace your steps to the outer arm, and the pathways _2-3_ times each. Do _1__ time per day.  Copyright  VHI. All rights reserved.  ARM: Dorsum of Hand to Shoulder - Sweep   Pump or stationary circles _5__ times on back of hand including knuckle spaces and individual fingers if needed working up towards the wrist, then retrace all your steps working back up the forearm, doing both sides; upper outer arm and back to your pathways _2-3_ times each. Then do 5 circles again at uninvolved armpit and involved groin where you started! Good job!! Do __1_ time per day.  Copyright  VHI. All rights reserved.

## 2019-01-03 NOTE — Therapy (Signed)
[place], [place], [address] Phone: [phone]   Fax:  [phone]  Physical Therapy Discharge  Patient Details  Name: Nicole Schmitt MRN: [id number] Date of Birth: February 19, 1961 Referring Provider (PT): [name]   Encounter Date: 01/03/2019  PT End of Session - 01/03/19 0815    Visit Number  7    Number of Visits  7    Date for PT Re-Evaluation  01/01/19    PT Start Time  0814    PT Stop Time  0845    PT Time Calculation (min)  31 min    Activity Tolerance  Patient tolerated treatment well    Behavior During Therapy  [facility] for tasks assessed/performed       Past Medical History:  Diagnosis Date  . Breast cancer ([place])   . Cancer ([place]) 1990   cervical   . Hypertension   . Personal history of chemotherapy    2017  . Personal history of radiation therapy    2018  . Seizures ([place])    > 20 years following fall from horse    Past Surgical History:  Procedure Laterality Date  . BREAST LUMPECTOMY Left 11/16/2015   Malignant  . BREAST LUMPECTOMY WITH RADIOACTIVE SEED AND SENTINEL LYMPH NODE BIOPSY Left 11/16/2015   Procedure: BREAST LUMPECTOMY WITH RADIOACTIVE SEED AND SENTINEL LYMPH NODE BIOPSY;  Surgeon: Excell Seltzer, MD;  Location: [facility];  Service: General;  Laterality: Left;  . CESAREAN SECTION    . COLONOSCOPY    . WISDOM TOOTH EXTRACTION      There were no vitals filed for this visit.  Subjective Assessment - 01/03/19 0814    Subjective  Pt reports that she has no pain today. She states that she sees a difference and is now able to lay on her L side without pain.    Currently in Pain?  No/denies    Pain Score  0-No pain         [facility] PT Assessment - 01/03/19 0001      AROM   Left Shoulder Flexion  152 Degrees    Left Shoulder ABduction  156 Degrees              Outpatient Rehab from 01/03/2019 in [facility]   Lymphedema Life Impact Scale Total Score  2.94 %                   PT Education - 01/03/19 0850    Education Details  Pt was educated on self MLD for the L breast and LUE. She was provided with hand out. Emphasis on direction, pressure, skin stretch. re-iterated education on the anatomy/physiology of the lymphatic system. Pt performed return demonstration with good results. Discussed discharge with patient and she is agreeable. She will contact MD if she notices any changes in limb edema, pain or function.    Person(s) Educated  Patient    Methods  Explanation;Demonstration;Tactile cues;Verbal cues;Handout    Comprehension  Verbalized understanding;Returned demonstration       PT Short Term Goals - 01/03/19 0816      PT SHORT TERM GOAL #1   Title  Pt will improve L shoulder flexion/abduction to 150 degrees within 3 weeks in order to improve functional mobility.    Baseline  152 flexion, 156 abduciton of the L shoulder    Status  Achieved        PT Long Term Goals -  01/03/19 0819      PT LONG TERM GOAL #1   Title  Pt will report 2/10 pain in the L shoulder infrequently within 6 weeks in order to demonstrate improved quality of life through decreased pain.    Baseline  0/10 usually, 4/10 occasionally    Status  Achieved      PT LONG TERM GOAL #2   Title  Pt will score a 0% on the lymphedema life impact survey within 6 weeks in order to demonstrate a better understanding of the risk and precautions related to lymphedema following breast surgery and radiation within 6 weeks.    Baseline  Pt scored a 2.94% on the lymphedema impact scale making progress in the understanding of what lymphdema is and how to manage the condition.    Status  Partially Met            Plan - 01/03/19 0815    Clinical Impression Statement  Pt was educated on self MLD this session with good results. She was able to demonstrate return demonstration with adequate skin stretch and  understanding of the concept of pushing the fluid away from the affected area. Pt has met most of her goals but continues with a 2.94% impact on the lymphedema impact scale due to continued slight pain in the L shoulder that has continuously gotten better since her initial evaluation. Pt feels confident in managing her pain/edema at this time and will contact the MD if she needs further physical therapy services in the future.    Personal Factors and Comorbidities  Comorbidity 1;Profession    Comorbidities  Cancer    Examination-Participation Restrictions  Cleaning;Laundry    Stability/Clinical Decision Making  Stable/Uncomplicated    PT Frequency  1x / week    PT Duration  6 weeks    PT Treatment/Interventions  ADLs/Self Care Home Management;Cryotherapy;Scientist, product/process development;Functional mobility training;Therapeutic activities;Therapeutic exercise;Neuromuscular re-education;Patient/family education;Manual techniques;Manual lymph drainage;Compression bandaging;Passive range of motion;Taping    PT Next Visit Plan  --    PT Home Exercise Plan  Access Code: [email or web address], Pt educated to continue to wear dense foam.    Consulted and Agree with Plan of Care  Patient       Patient will benefit from skilled therapeutic intervention in order to improve the following deficits and impairments:  Increased muscle spasms, Improper body mechanics, Decreased scar mobility, Pain  Visit Diagnosis: Chronic left shoulder pain  Abnormal posture  Malignant neoplasm of upper-outer quadrant of left breast in female, estrogen receptor positive ([place])     Problem List Patient Active Problem List   Diagnosis Date Noted  . Chemotherapy-induced peripheral neuropathy ([place]) 01/01/2018  . Lobar pneumonia, unspecified organism ([place]) 02/15/2016  . Febrile neutropenia ([place]) 02/12/2016  . Community acquired pneumonia of left lower lobe of lung   . Cutaneous abscess of right axilla   . HTN (hypertension)  01/13/2016  . Breast cancer of upper-outer quadrant of left female breast ([place]) 11/23/2015   PHYSICAL THERAPY DISCHARGE SUMMARY  Plan: Patient agrees to discharge.  Patient goals were partially met. Patient is being discharged due to being pleased with the current functional level.  ?????       Nicole Schmitt , PT 01/03/2019, 8:57 AM  [place], [place], [address] Phone: [phone]   Fax:  [phone]  Name: Nicole Schmitt MRN: [id number] Date of Birth: 1961/01/03

## 2019-06-30 NOTE — Progress Notes (Signed)
[place]   [phone] [phone]   Clinic Follow up Note   Patient Care Team: Andres Shad, MD as PCP - General (Family Medicine) [name], MD as Consulting Physician (Hematology) Tyler Pita, MD as Consulting Physician (Radiation Oncology) Excell Seltzer, MD (Inactive) as Consulting Physician (General Surgery) Gardenia Phlegm, NP as Nurse Practitioner (Hematology and Oncology)  Date of Service:  07/02/2019  CHIEF COMPLAINT: Follow up left breast cancer  SUMMARY OF ONCOLOGIC HISTORY: Oncology History Overview Note  Breast cancer of upper-outer quadrant of left female breast [facility])   Staging form: Breast, AJCC 7th Edition   - Pathologic: Stage IIA (T2, N0, cM0) - Unsigned    Breast cancer of upper-outer quadrant of left female breast ([place])  10/20/2015 Mammogram   Screening mammogram showed new nodular density on the left breast, measuring about 2 cm. the well defined nodules bilaterally are otherwise stable.   10/27/2015 Initial Biopsy   Left breast 2:00 position mass biopsy showed infiltrating ductal carcinoma.    10/27/2015 Initial Diagnosis   Breast cancer of upper-outer quadrant of left female breast ([place])   10/27/2015 Receptors her2   ER 100% positive, strong staining, PR 90% positive, strong staining, HER-2 IHC 2+, FISH negative.   11/16/2015 Surgery   Left breast lumpectomy and sentinel lymph node biopsy.   11/16/2015 Pathology Results   Left breast lumpectomy showed invasive grade 3 ductal carcinoma, 2.2 cm, intermediate grade DCIS. Margins were negative. One sentinel lymph node was negative. Lymphovascular invasion was negative.   11/16/2015 Oncotype testing   RS 23, intermediate risk, which predicts 10 year risk of distant recurrence 15% with tamoxifen   12/23/2015 - 03/01/2016 Adjuvant Chemotherapy   Docetaxel and Cytoxan, every 3 weeks, with Neulasta on day 2, for 4 cycles   03/27/2016 - 05/10/2016  Radiation Therapy   Adjuvant breast radiation 03/27/16 - 05/10/16 60.4 Gy in 33 fractions to the left breast by Dr. Tammi Klippel.   05/31/2016 -  Anti-estrogen oral therapy   Letrozole 2.5 mg daily       CURRENT THERAPY:  Letrozole 2.5 mg, 1 tablet daily, started 05/31/16   INTERVAL HISTORY:  Nicole Schmitt is here for a follow up of left breast cancer. She was last seen by me 8 months ago. She presents to the clinic alone. She notes she is doing well. She denies any new changes. She denies any new pain. She notes she is tolerating letrozole but has bad hot flashes at night. She notes her joint pain has improved and still manageable without medication. She does not feel she needs further medication for her hot flashes. She notes she still has mild residual neuropathy in her hands, manageable. She notes her almost missing her visit is why her BP is elevated today. She notes having irregular heart rate or high BP and has been monitoring her heart function. She is on Losartan and HCTZ only as needed. She is no longer on Metformin which was used to help her weight. She notes she has been working on weight loss and tried [employer] and oral diet supplement. She notes she is more overwhelmed but able to do better with help. She notes she plans to f/u with new surgeon this year.    REVIEW OF SYSTEMS:   Constitutional: Denies fevers, chills or abnormal weight loss (+) Hot flashes  Eyes: Denies blurriness of vision Ears, nose, mouth, throat, and face: Denies mucositis or sore throat Respiratory: Denies cough, dyspnea or wheezes  Cardiovascular: Denies palpitation, chest discomfort or lower extremity swelling Gastrointestinal:  Denies nausea, heartburn or change in bowel habits Skin: Denies abnormal skin rashes MSK: (+) Improved joint pain  Lymphatics: Denies new lymphadenopathy or easy bruising Neurological: (+) Stable residual neuropathy in he hands.   Behavioral/Psych: Mood is stable, no new  changes  All other systems were reviewed with the patient and are negative.  MEDICAL HISTORY:  Past Medical History:  Diagnosis Date  . Breast cancer ([place])   . Cancer ([place]) 1990   cervical   . Hypertension   . Personal history of chemotherapy    2017  . Personal history of radiation therapy    2018  . Seizures ([place])    > 20 years following fall from horse    SURGICAL HISTORY: Past Surgical History:  Procedure Laterality Date  . BREAST LUMPECTOMY Left 11/16/2015   Malignant  . BREAST LUMPECTOMY WITH RADIOACTIVE SEED AND SENTINEL LYMPH NODE BIOPSY Left 11/16/2015   Procedure: BREAST LUMPECTOMY WITH RADIOACTIVE SEED AND SENTINEL LYMPH NODE BIOPSY;  Surgeon: Excell Seltzer, MD;  Location: [facility];  Service: General;  Laterality: Left;  . CESAREAN SECTION    . COLONOSCOPY    . WISDOM TOOTH EXTRACTION      I have reviewed the social history and family history with the patient and they are unchanged from previous note.  ALLERGIES:  has No Known Allergies.  MEDICATIONS:  Current Outpatient Medications  Medication Sig Dispense Refill  . CLINDAGEL 1 % gel Apply 1 application topically daily as needed.     . hydrochlorothiazide (HYDRODIURIL) 25 MG tablet Take 25 mg by mouth daily. 12.5 mg to 25 mg dependent on bp medication    . letrozole (FEMARA) 2.5 MG tablet Take 1 tablet (2.5 mg total) by mouth daily. 90 tablet 3  . losartan (COZAAR) 50 MG tablet Take 50 mg by mouth daily.    . non-metallic deodorant Jethro Poling) MISC Apply 1 application topically daily as needed.     No current facility-administered medications for this visit.    PHYSICAL EXAMINATION: ECOG PERFORMANCE STATUS: 0 - Asymptomatic  Vitals:   07/02/19 0950  BP: (!) 123/99  Pulse: 64  Resp: 20  Temp: 98.2 F (36.8 C)  SpO2: 100%   Filed Weights   07/02/19 0950  Weight: 258 lb 12.8 oz (117.4 kg)    GENERAL:alert, no distress and comfortable SKIN: skin color, texture, turgor are normal,  no rashes or significant lesions EYES: normal, Conjunctiva are pink and non-injected, sclera clear  NECK: supple, thyroid normal size, non-tender, without nodularity LYMPH:  no palpable lymphadenopathy in the cervical, axillary  LUNGS: clear to auscultation and percussion with normal breathing effort HEART: regular rate & rhythm and no murmurs and no lower extremity edema ABDOMEN:abdomen soft, non-tender and normal bowel sounds Musculoskeletal:no cyanosis of digits and no clubbing  NEURO: alert & oriented x 3 with fluent speech, no focal motor/sensory deficits BREAST: S/p left lumpectomy: surgical incision healed well with 1cm scar tissue around left lateral incision. (+) Mild asymmetrical breasts and skin darkening of left breast. No palpable mass, nodules or adenopathy bilaterally. Breast exam benign.   LABORATORY DATA:  I have reviewed the data as listed CBC Latest Ref Rng & Units 07/02/2019 11/01/2018 07/04/2017  WBC 4.0 - 10.5 K/uL 8.4 8.4 7.4  Hemoglobin 12.0 - 15.0 g/dL 13.5 13.2 13.8  Hematocrit 36.0 - 46.0 % 41.3 40.2 41.1  Platelets 150 - 400 K/uL 287 271 276  CMP Latest Ref Rng & Units 07/02/2019 11/01/2018 07/04/2017  Glucose 70 - 99 mg/dL 95 86 96  BUN 6 - 20 mg/dL '12 15 18  ' Creatinine 0.44 - 1.00 mg/dL 0.73 0.71 0.78  Sodium 135 - 145 mmol/L 139 141 140  Potassium 3.5 - 5.1 mmol/L 3.8 4.0 3.8  Chloride 98 - 111 mmol/L 105 107 105  CO2 22 - 32 mmol/L '27 25 27  ' Calcium 8.9 - 10.3 mg/dL 9.5 9.5 10.2  Total Protein 6.5 - 8.1 g/dL 7.2 6.9 7.4  Total Bilirubin 0.3 - 1.2 mg/dL 0.5 0.3 0.5  Alkaline Phos 38 - 126 U/L 113 106 124  AST 15 - 41 U/L 14(L) 15 17  ALT 0 - 44 U/L '12 14 18      ' RADIOGRAPHIC STUDIES: I have personally reviewed the radiological images as listed and agreed with the findings in the report. No results found.   ASSESSMENT & PLAN:  Nicole Schmitt is a 59 y.o. female with    1. 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 She is s/p left breastlumpectomy, and adjuvant radiation. -she received adjuvant chemo TC for 4 cycles -She started anti-estrogen therapy with Letrozole in 05/2016. Tolerating well overall except mild arthralgia and stiffness, if this worsens we will consider switching to exemestane. -I discussed her letrozole can slow down her metabolism. I strongly encouraged her to watch her HgA1c, Cholesterol level and weight with PCP  -pt asked about her future risk of breast cancer recurrence. I reviewed her Oncotype results RS 23 which predicts 15% risk of distant recurrence with Tamoxifen in the next 10 years. She has had adjuvant chemo which reduced her risk further. She voiced good understanding.  -She is clinically doing well. Lab reviewed, her CBC and CMP are within normal limits. Her physical exam and her 10/2018 mammogram were unremarkable. There is no clinical concern for recurrence. -Continue Surveillance. Next mammogram in 10/2019 -Continue letrozole  -F/u in 6 months  2. Peripheral neuropathy, grade 1 -Secondary to adjuvant chemotherapy -I previously encouraged her to gradually increase her activity levels, and try to exercise -She has mild residual neuropathyin hands. We'll continue observation.   3. HTN, Irregular heart rate -She is on antihypertensive medication, including HCTZ as needed.  -Follow-up of his primary care physician -Her cardiologist has been monitoring her heart function due to recent irregular heart rate.   4. Osteopenia -Her Bone density from 10/20/16 shows she is slightly osteopenic, her T-score -1.1. Stable osteopenia on 10/2018 DEXA (-1.1 T-score) -Iagainsuggestedshe takes calcium, vitamin D and exercise regularly.  5. Obesity   -Ipreviouslydiscussed how letrozole can cause her to gain weight. I encourage her to diet and exercise and to watch her  weight. -She has tried weight watchers and [facility] with [facility]. She tried oral Saxenda before but did not tolerate well. She is currently using the [employer] program to help keep her on track.  -I discussed [facility], she is interested in more information.  -Her PCP started her on Metformin, but she stopped soon after. Her A1c is in normal range.    6. Arthralgia and muscle achiness, Hot flashes  -secondary to Letrozole  -Her joint pain has improved and manageable without OTC Medication -She does have increased hot flashes at night, but she feels is manageable without further medication.    PLAN -Continue  letrozole  -Mammogram in 10/2019  -Lab and f/u in 6 months  -referral to weight management clinic    No problem-specific Assessment & Plan notes found for this encounter.   Orders Placed This Encounter  Procedures  . MM DIAG BREAST TOMO BILATERAL    Standing Status:   Future    Standing Expiration Date:   07/01/2020    Order Specific Question:   Reason for Exam (SYMPTOM  OR DIAGNOSIS REQUIRED)    Answer:   screening    Order Specific Question:   Is the patient pregnant?    Answer:   No    Order Specific Question:   Preferred imaging location?    Answer:   [facility]  . Amb Ref to Medical Weight Management    Referral Priority:   Routine    Referral Type:   Consultation    Number of Visits Requested:   1   All questions were answered. The patient knows to call the clinic with any problems, questions or concerns. No barriers to learning was detected. The total time spent in the appointment was 30 minutes.     [name], MD 07/02/2019   I, Joslyn Devon, am acting as scribe for [name], MD.   I have reviewed the above documentation for accuracy and completeness, and I agree with the above.

## 2019-07-02 ENCOUNTER — Inpatient Hospital Stay: Payer: BC Managed Care – PPO | Admitting: Hematology

## 2019-07-02 ENCOUNTER — Encounter: Payer: Self-pay | Admitting: Hematology

## 2019-07-02 ENCOUNTER — Inpatient Hospital Stay: Payer: BC Managed Care – PPO | Attending: Hematology

## 2019-07-02 ENCOUNTER — Other Ambulatory Visit: Payer: Self-pay

## 2019-07-02 VITALS — BP 123/99 | HR 64 | Temp 98.2°F | Resp 20 | Ht 65.0 in | Wt 258.8 lb

## 2019-07-02 DIAGNOSIS — Z9221 Personal history of antineoplastic chemotherapy: Secondary | ICD-10-CM | POA: Insufficient documentation

## 2019-07-02 DIAGNOSIS — Z79811 Long term (current) use of aromatase inhibitors: Secondary | ICD-10-CM | POA: Diagnosis not present

## 2019-07-02 DIAGNOSIS — M8580 Other specified disorders of bone density and structure, unspecified site: Secondary | ICD-10-CM | POA: Insufficient documentation

## 2019-07-02 DIAGNOSIS — M2550 Pain in unspecified joint: Secondary | ICD-10-CM | POA: Diagnosis not present

## 2019-07-02 DIAGNOSIS — R232 Flushing: Secondary | ICD-10-CM | POA: Diagnosis not present

## 2019-07-02 DIAGNOSIS — C50412 Malignant neoplasm of upper-outer quadrant of left female breast: Secondary | ICD-10-CM

## 2019-07-02 DIAGNOSIS — Z923 Personal history of irradiation: Secondary | ICD-10-CM | POA: Diagnosis not present

## 2019-07-02 DIAGNOSIS — Z170 Estrogen receptor positive status [ER+]: Secondary | ICD-10-CM

## 2019-07-02 DIAGNOSIS — N951 Menopausal and female climacteric states: Secondary | ICD-10-CM | POA: Diagnosis not present

## 2019-07-02 DIAGNOSIS — I10 Essential (primary) hypertension: Secondary | ICD-10-CM | POA: Insufficient documentation

## 2019-07-02 DIAGNOSIS — Z79899 Other long term (current) drug therapy: Secondary | ICD-10-CM | POA: Insufficient documentation

## 2019-07-02 DIAGNOSIS — E669 Obesity, unspecified: Secondary | ICD-10-CM | POA: Insufficient documentation

## 2019-07-02 LAB — COMPREHENSIVE METABOLIC PANEL
ALT: 12 U/L (ref 0–44)
AST: 14 U/L — ABNORMAL LOW (ref 15–41)
Albumin: 3.6 g/dL (ref 3.5–5.0)
Alkaline Phosphatase: 113 U/L (ref 38–126)
Anion gap: 7 (ref 5–15)
BUN: 12 mg/dL (ref 6–20)
CO2: 27 mmol/L (ref 22–32)
Calcium: 9.5 mg/dL (ref 8.9–10.3)
Chloride: 105 mmol/L (ref 98–111)
Creatinine, Ser: 0.73 mg/dL (ref 0.44–1.00)
GFR calc Af Amer: >60 mL/min (ref >60)
GFR calc non Af Amer: >60 mL/min (ref >60)
Glucose, Bld: 95 mg/dL (ref 70–99)
Potassium: 3.8 mmol/L (ref 3.5–5.1)
Sodium: 139 mmol/L (ref 135–145)
Total Bilirubin: 0.5 mg/dL (ref 0.3–1.2)
Total Protein: 7.2 g/dL (ref 6.5–8.1)

## 2019-07-02 LAB — CBC WITH DIFFERENTIAL/PLATELET
Abs Immature Granulocytes: 0.01 10*3/uL (ref 0.00–0.07)
Basophils Absolute: 0 10*3/uL (ref 0.0–0.1)
Basophils Relative: 0 %
Eosinophils Absolute: 0.1 10*3/uL (ref 0.0–0.5)
Eosinophils Relative: 1 %
HCT: 41.3 % (ref 36.0–46.0)
Hemoglobin: 13.5 g/dL (ref 12.0–15.0)
Immature Granulocytes: 0 %
Lymphocytes Relative: 32 %
Lymphs Abs: 2.7 10*3/uL (ref 0.7–4.0)
MCH: 29 pg (ref 26.0–34.0)
MCHC: 32.7 g/dL (ref 30.0–36.0)
MCV: 88.6 fL (ref 80.0–100.0)
Monocytes Absolute: 0.6 10*3/uL (ref 0.1–1.0)
Monocytes Relative: 7 %
Neutro Abs: 5.1 10*3/uL (ref 1.7–7.7)
Neutrophils Relative %: 60 %
Platelets: 287 10*3/uL (ref 150–400)
RBC: 4.66 MIL/uL (ref 3.87–5.11)
RDW: 13.3 % (ref 11.5–15.5)
WBC: 8.4 10*3/uL (ref 4.0–10.5)
nRBC: 0 % (ref 0.0–0.2)

## 2019-07-03 ENCOUNTER — Telehealth: Payer: Self-pay | Admitting: Hematology

## 2019-07-03 NOTE — Telephone Encounter (Signed)
Scheduled appt per 5/5 los.  Left a vm of the appt date and time.

## 2019-07-14 ENCOUNTER — Encounter: Payer: Self-pay | Admitting: Hematology

## 2019-07-16 ENCOUNTER — Other Ambulatory Visit: Payer: Self-pay

## 2019-07-24 ENCOUNTER — Encounter (INDEPENDENT_AMBULATORY_CARE_PROVIDER_SITE_OTHER): Payer: Self-pay

## 2019-08-04 ENCOUNTER — Other Ambulatory Visit: Payer: Self-pay

## 2019-08-04 ENCOUNTER — Ambulatory Visit (INDEPENDENT_AMBULATORY_CARE_PROVIDER_SITE_OTHER): Payer: BC Managed Care – PPO | Admitting: Family Medicine

## 2019-08-04 ENCOUNTER — Encounter (INDEPENDENT_AMBULATORY_CARE_PROVIDER_SITE_OTHER): Payer: Self-pay | Admitting: Family Medicine

## 2019-08-04 VITALS — BP 130/80 | HR 60 | Temp 97.8°F | Ht 65.0 in | Wt 257.0 lb

## 2019-08-04 DIAGNOSIS — Z1331 Encounter for screening for depression: Secondary | ICD-10-CM | POA: Diagnosis not present

## 2019-08-04 DIAGNOSIS — R739 Hyperglycemia, unspecified: Secondary | ICD-10-CM | POA: Diagnosis not present

## 2019-08-04 DIAGNOSIS — E66813 Obesity, class 3: Secondary | ICD-10-CM

## 2019-08-04 DIAGNOSIS — R0602 Shortness of breath: Secondary | ICD-10-CM

## 2019-08-04 DIAGNOSIS — Z6841 Body Mass Index (BMI) 40.0 and over, adult: Secondary | ICD-10-CM

## 2019-08-04 DIAGNOSIS — I10 Essential (primary) hypertension: Secondary | ICD-10-CM

## 2019-08-04 DIAGNOSIS — Z9189 Other specified personal risk factors, not elsewhere classified: Secondary | ICD-10-CM

## 2019-08-04 DIAGNOSIS — Z0289 Encounter for other administrative examinations: Secondary | ICD-10-CM

## 2019-08-04 DIAGNOSIS — R5383 Other fatigue: Secondary | ICD-10-CM | POA: Diagnosis not present

## 2019-08-04 NOTE — Progress Notes (Signed)
Dear Dr. [name],   Thank you for referring Nicole Schmitt to our clinic. The following note includes my evaluation and treatment recommendations.  Chief Complaint:   OBESITY Nicole Schmitt (MR# [id number]) is a 59 y.o. female who presents for evaluation and treatment of obesity and related comorbidities. Current BMI is Body mass index is 42.77 kg/m.Nicole Schmitt has been struggling with her weight for many years and has been unsuccessful in either losing weight, maintaining weight loss, or reaching her healthy weight goal.  Nicole Schmitt is currently in the action stage of change and ready to dedicate time achieving and maintaining a healthier weight. Nicole Schmitt is interested in becoming our patient and working on intensive lifestyle modifications including (but not limited to) diet and exercise for weight loss.  Nicole Schmitt typically doesn't eat breakfast - has coffee with creamer and sweetener, but often skips breakfast; Lunch is a salad or [place] sandwich from the hospital cafeteria (2 slices of [place] with 1 slice of cheese), lettuce, mayonnaise, salt and pepper, handful of chips/pretzels and feels full/satisfied; Snack is cheese crackers, 4-5 olives, or 2 shortbread cookies; Dinner is chicken (boneless skinless thigh/breast), hotdog or burger, salad or 6-7 spears of asparagus or 1/2 cup rice or 1 cup of potato and feels full; occasionally after dinner will have ice cream.   Nicole Schmitt's habits were reviewed today and are as follows: Her family eats meals together, she thinks her family will eat healthier with her, she has been heavy most of her life, she started gaining weight at the age of 61, her heaviest weight ever was 276 pounds, she craves salty foods, she snacks frequently in the evenings, she skips breakfast often and lunch sometimes, she has binge eating behaviors and she struggles with emotional eating.  Depression Screen Nicole Schmitt (modified PHQ-9) score was 7.  Depression screen  PHQ 2/9 08/04/2019  Decreased Interest 2  Down, Depressed, Hopeless 1  PHQ - 2 Score 3  Altered sleeping 1  Tired, decreased energy 2  Change in appetite 1  Feeling bad or failure about yourself  0  Trouble concentrating 0  Moving slowly or fidgety/restless 0  Suicidal thoughts 0  PHQ-9 Score 7  Difficult doing work/chores Not difficult at all   Subjective:   Other fatigue. Sachi denies daytime somnolence and denies waking up still tired. Nicole Schmitt generally gets 5-8 hours of sleep per night, and states that she does not sleep well most nights. Snoring is present. Apneic episodes are not present. Epworth Sleepiness Score is 4.  SOB (shortness of breath) on exertion. Markeya notes increasing shortness of breath with exercising and seems to be worsening over time with weight gain. She notes getting out of breath sooner with activity than she used to. This has gotten worse recently. Damaya denies shortness of breath at rest or orthopnea. EKG showed NSR at 68 BPM (sees Cardiology).  Essential hypertension. Blood pressure is well controlled today. Nicole Schmitt takes HCTZ as needed. She is on losartan - is not sure for how long. She does report a history of pre-eclampsia.  BP Readings from Last 3 Encounters:  08/04/19 130/80  07/02/19 (!) 123/99  11/01/18 134/71   Lab Results  Component Value Date   CREATININE 0.73 07/02/2019   CREATININE 0.71 11/01/2018   CREATININE 0.78 07/04/2017   Hyperglycemia. Nicole Schmitt has a history of some elevated blood glucose readings without a diagnosis of diabetes. She reports 1 blood sugar >100 in recent labs.  Depression screening. Nicole Schmitt has  a mildly positive depression screening with a PHQ-9 score of 7.  At risk for heart disease. Nicole Schmitt is at a higher than average risk for cardiovascular disease due to obesity.   Assessment/Plan:   Other fatigue. Jennilee does feel that her weight is causing her energy to be lower than it should be. Fatigue may be related to  obesity, depression or many other causes. Labs will be ordered, and in the meanwhile, Nivea will focus on self care including making healthy food choices, increasing physical activity and focusing on stress reduction. EKG 12-Lead, Hemoglobin A1c, Insulin, random testing ordered today.  SOB (shortness of breath) on exertion. Alzina does feel that she gets out of breath more easily that she used to when she exercises. Nicole Schmitt shortness of breath appears to be obesity related and exercise induced. She has agreed to work on weight loss and gradually increase exercise to treat her exercise induced shortness of breath. Will continue to monitor closely. EKG 12-Lead, Hemoglobin A1c, Insulin, random ordered today.  Essential hypertension. Nicole Schmitt is working on healthy weight loss and exercise to improve blood pressure control. We will watch for signs of hypotension as she continues her lifestyle modifications. Will follow-up blood pressure at her next appointment. EKG and IC performed today.  Hyperglycemia. Fasting labs will be obtained and results with be discussed with Nicole Schmitt in 2 weeks at her follow up visit. In the meanwhile Nicole Schmitt was started on a lower simple carbohydrate diet and will work on weight loss efforts. Hemoglobin A1c, Insulin, random labs ordered today.  Depression screening. Nicole Schmitt had a positive depression screening. Depression is commonly associated with obesity and often results in emotional eating behaviors. We will monitor this closely and work on CBT to help improve the non-hunger eating patterns. Referral to Psychology may be required if no improvement is seen as she continues in our clinic.  At risk for heart disease. Nicole Schmitt was given approximately 15 minutes of coronary artery disease prevention counseling today. She is 59 y.o. female and has risk factors for heart disease including obesity. We discussed intensive lifestyle modifications today with an emphasis on specific weight loss  instructions and strategies.   Repetitive spaced learning was employed today to elicit superior memory formation and behavioral change.  Class 3 severe obesity with serious comorbidity and body mass index (BMI) of 40.0 to 44.9 in adult, unspecified obesity type ([place]).   Nicole Schmitt is currently in the action stage of change and her goal is to continue with weight loss efforts. I recommend Nicole Schmitt begin the structured treatment plan as follows:  She has agreed to the Category 3 Plan.  Exercise goals: No exercise has been prescribed at this time.   Behavioral modification strategies: increasing lean protein intake, meal planning and cooking strategies, keeping healthy foods in the home and planning for success.  She was informed of the importance of frequent follow-up visits to maximize her success with intensive lifestyle modifications for her multiple health conditions. She was informed we would discuss her lab results at her next visit unless there is a critical issue that needs to be addressed sooner. Nicole Schmitt agreed to keep her next visit at the agreed upon time to discuss these results.  Objective:   Blood pressure 130/80, pulse 60, temperature 97.8 F (36.6 C), height 5\' 5"  (1.651 m), weight 257 lb (116.6 kg), SpO2 98 %. Body mass index is 42.77 kg/m.  EKG: Sinus  Rhythm with a rate of 68 BPM. Low voltage in precordial leads. RSR(V1) - nondiagnostic.  Abnormal.    Indirect Calorimeter completed today shows a VO2 of 297 and a REE of 2068.  Her calculated basal metabolic rate is 1610 thus her basal metabolic rate is better than expected.  General: Cooperative, alert, well developed, in no acute distress. HEENT: Conjunctivae and lids unremarkable. Cardiovascular: Regular rhythm.  Lungs: Normal work of breathing. Neurologic: No focal deficits.   Lab Results  Component Value Date   CREATININE 0.73 07/02/2019   BUN 12 07/02/2019   NA 139 07/02/2019   K 3.8 07/02/2019   CL 105 07/02/2019     CO2 27 07/02/2019   Lab Results  Component Value Date   ALT 12 07/02/2019   AST 14 (L) 07/02/2019   ALKPHOS 113 07/02/2019   BILITOT 0.5 07/02/2019   No results found for: HGBA1C No results found for: INSULIN No results found for: TSH No results found for: CHOL, HDL, LDLCALC, LDLDIRECT, TRIG, CHOLHDL Lab Results  Component Value Date   WBC 8.4 07/02/2019   HGB 13.5 07/02/2019   HCT 41.3 07/02/2019   MCV 88.6 07/02/2019   PLT 287 07/02/2019   No results found for: IRON, TIBC, FERRITIN  Attestation Statements:   Reviewed by clinician on day of visit: allergies, medications, problem list, medical history, surgical history, family history, social history, and previous encounter notes.  This is the patient's first visit at [facility]. The patient's NEW PATIENT PACKET was reviewed at length. Included in the packet: current and past health history, medications, allergies, ROS, gynecologic history (women only), surgical history, family history, social history, weight history, weight loss surgery history (for those that have had weight loss surgery), nutritional evaluation, Schmitt and food questionnaire, PHQ9, Epworth questionnaire, sleep habits questionnaire, patient life and health improvement goals questionnaire. These will all be scanned into the patient's chart under media.   During the visit, I independently reviewed the patient's EKG, bioimpedance scale results, and indirect calorimeter results. I used this information to tailor a meal plan for the patient that will help her to lose weight and will improve her obesity-related conditions going forward. I performed a medically necessary appropriate examination and/or evaluation. I discussed the assessment and treatment plan with the patient. The patient was provided an opportunity to ask questions and all were answered. The patient agreed with the plan and demonstrated an understanding of the instructions. Labs were ordered  at this visit and will be reviewed at the next visit unless more critical results need to be addressed immediately. Clinical information was updated and documented in the EMR.   Time spent on visit including pre-visit chart review and post-visit care was 45 minutes.   A separate 15 minutes was spent on risk counseling (see above).    I, Michaelene Song, am acting as transcriptionist for Coralie Common, MD   I I have reviewed the above documentation for accuracy and completeness, and I agree with the above. - Jinny Blossom, MD

## 2019-08-05 LAB — VITAMIN D 25 HYDROXY (VIT D DEFICIENCY, FRACTURES): Vit D, 25-Hydroxy: 18.4 ng/mL — ABNORMAL LOW (ref 30.0–100.0)

## 2019-08-05 LAB — VITAMIN B12: Vitamin B-12: 409 pg/mL (ref 232–1245)

## 2019-08-05 LAB — LIPID PANEL WITH LDL/HDL RATIO
Cholesterol, Total: 176 mg/dL (ref 100–199)
HDL: 48 mg/dL (ref >39)
LDL Chol Calc (NIH): 112 mg/dL — ABNORMAL HIGH (ref 0–99)
LDL/HDL Ratio: 2.3 ratio (ref 0.0–3.2)
Triglycerides: 89 mg/dL (ref 0–149)
VLDL Cholesterol Cal: 16 mg/dL (ref 5–40)

## 2019-08-05 LAB — COMPREHENSIVE METABOLIC PANEL
ALT: 12 IU/L (ref 0–32)
AST: 14 IU/L (ref 0–40)
Albumin/Globulin Ratio: 1.5 (ref 1.2–2.2)
Albumin: 4.3 g/dL (ref 3.8–4.9)
Alkaline Phosphatase: 142 IU/L — ABNORMAL HIGH (ref 48–121)
BUN/Creatinine Ratio: 22 (ref 9–23)
BUN: 14 mg/dL (ref 6–24)
Bilirubin Total: 0.3 mg/dL (ref 0.0–1.2)
CO2: 26 mmol/L (ref 20–29)
Calcium: 9.4 mg/dL (ref 8.7–10.2)
Chloride: 99 mmol/L (ref 96–106)
Creatinine, Ser: 0.64 mg/dL (ref 0.57–1.00)
GFR calc Af Amer: 115 mL/min/{1.73_m2} (ref >59)
GFR calc non Af Amer: 99 mL/min/{1.73_m2} (ref >59)
Globulin, Total: 2.8 g/dL (ref 1.5–4.5)
Glucose: 96 mg/dL (ref 65–99)
Potassium: 4.2 mmol/L (ref 3.5–5.2)
Sodium: 143 mmol/L (ref 134–144)
Total Protein: 7.1 g/dL (ref 6.0–8.5)

## 2019-08-05 LAB — HEMOGLOBIN A1C
Est. average glucose Bld gHb Est-mCnc: 114 mg/dL
Hgb A1c MFr Bld: 5.6 % (ref 4.8–5.6)

## 2019-08-05 LAB — T3: T3, Total: 115 ng/dL (ref 71–180)

## 2019-08-05 LAB — CBC WITH DIFFERENTIAL/PLATELET
Basophils Absolute: 0 10*3/uL (ref 0.0–0.2)
Basos: 1 %
EOS (ABSOLUTE): 0.1 10*3/uL (ref 0.0–0.4)
Eos: 1 %
Hematocrit: 42.7 % (ref 34.0–46.6)
Hemoglobin: 13.6 g/dL (ref 11.1–15.9)
Immature Grans (Abs): 0 10*3/uL (ref 0.0–0.1)
Immature Granulocytes: 0 %
Lymphocytes Absolute: 2.5 10*3/uL (ref 0.7–3.1)
Lymphs: 31 %
MCH: 28.4 pg (ref 26.6–33.0)
MCHC: 31.9 g/dL (ref 31.5–35.7)
MCV: 89 fL (ref 79–97)
Monocytes Absolute: 0.5 10*3/uL (ref 0.1–0.9)
Monocytes: 6 %
Neutrophils Absolute: 5.1 10*3/uL (ref 1.4–7.0)
Neutrophils: 61 %
Platelets: 305 10*3/uL (ref 150–450)
RBC: 4.79 x10E6/uL (ref 3.77–5.28)
RDW: 13.2 % (ref 11.7–15.4)
WBC: 8.3 10*3/uL (ref 3.4–10.8)

## 2019-08-05 LAB — FOLATE: Folate: 11.2 ng/mL (ref >3.0)

## 2019-08-05 LAB — INSULIN, RANDOM: INSULIN: 24.2 u[IU]/mL (ref 2.6–24.9)

## 2019-08-05 LAB — TSH: TSH: 2.98 u[IU]/mL (ref 0.450–4.500)

## 2019-08-05 LAB — T4, FREE: Free T4: 1.03 ng/dL (ref 0.82–1.77)

## 2019-08-18 ENCOUNTER — Ambulatory Visit (INDEPENDENT_AMBULATORY_CARE_PROVIDER_SITE_OTHER): Payer: BC Managed Care – PPO | Admitting: Family Medicine

## 2019-08-18 ENCOUNTER — Encounter (INDEPENDENT_AMBULATORY_CARE_PROVIDER_SITE_OTHER): Payer: Self-pay | Admitting: Family Medicine

## 2019-08-18 ENCOUNTER — Other Ambulatory Visit: Payer: Self-pay

## 2019-08-18 VITALS — BP 135/81 | HR 76 | Temp 98.0°F | Ht 65.0 in | Wt 250.0 lb

## 2019-08-18 DIAGNOSIS — E559 Vitamin D deficiency, unspecified: Secondary | ICD-10-CM

## 2019-08-18 DIAGNOSIS — E8881 Metabolic syndrome: Secondary | ICD-10-CM | POA: Diagnosis not present

## 2019-08-18 DIAGNOSIS — Z6841 Body Mass Index (BMI) 40.0 and over, adult: Secondary | ICD-10-CM

## 2019-08-18 DIAGNOSIS — Z9189 Other specified personal risk factors, not elsewhere classified: Secondary | ICD-10-CM | POA: Diagnosis not present

## 2019-08-18 DIAGNOSIS — E7849 Other hyperlipidemia: Secondary | ICD-10-CM

## 2019-08-18 MED ORDER — VITAMIN D (ERGOCALCIFEROL) 1.25 MG (50000 UNIT) PO CAPS: 50000.0000 [IU] | ORAL_CAPSULE | ORAL | 0 refills | Status: DC

## 2019-08-19 NOTE — Progress Notes (Signed)
Chief Complaint:   OBESITY Nicole Schmitt is here to discuss her progress with her obesity treatment plan along with follow-up of her obesity related diagnoses. Nicole Schmitt is on the Category 3 Plan and states she is following her eating plan approximately 95% of the time. Nicole Schmitt states she is doing 0 minutes 0 times per week.  Today's visit was #: 2 Starting weight: 257 lbs Starting date: 08/04/2019 Today's weight: 250 lbs Today's date: 08/18/2019 Total lbs lost to date: 7 Total lbs lost since last in-office visit: 7  Interim History: Nicole Schmitt found that meals were significantly more than what she is used to eating. She often couldn't finish all of the meat at dinner. She is doing salad at dinner. Snack calories are string cheese, rice cake with peanut butter, yogurt, fruit, or flatbread crackers. She notes 1 day of sweet cravings. She did a frozen dinner for lunch.  Subjective:   1. Vitamin D deficiency Nicole Schmitt vit D level is of 18.4. She is not on Vit D prescription. I discussed labs with the patient today.  2. Insulin resistance Nicole Schmitt A1c is 5.6 and insulin is 24.2. She is not on medications. I discussed labs with the patient today.  3. Other hyperlipidemia Nicole Schmitt LDL is of 112, HDL 48, and triglycerides 89. She is not on statin. I discussed labs with the patient today.  4. At risk for diabetes mellitus Nicole Schmitt is at higher than average risk for developing diabetes due to her obesity.   Assessment/Plan:   1. Vitamin D deficiency Low Vitamin D level contributes to fatigue and are associated with obesity, breast, and colon cancer. Nicole Schmitt agreed to start prescription Vitamin D 50,000 IU every week with no refills. She will follow-up for routine testing of Vitamin D, at least 2-3 times per year to avoid over-replacement.  - Vitamin D, Ergocalciferol, (DRISDOL) 1.25 MG (50000 UNIT) CAPS capsule; Take 1 capsule (50,000 Units total) by mouth every 7 (seven) days.  Dispense: 4 capsule;  Refill: 0  2. Insulin resistance Nicole Schmitt will continue to work on weight loss, exercise, and decreasing simple carbohydrates to help decrease the risk of diabetes. We will repeat labs in 3 months, no medications have been prescribed at this time. Nicole Schmitt agreed to follow-up with [place] as directed to closely monitor her progress.  3. Other hyperlipidemia Cardiovascular risk and specific lipid/LDL goals reviewed. We discussed several lifestyle modifications today and Nicole Schmitt will continue to work on diet, exercise and weight loss efforts. We will follow up on labs in 3 months. Orders and follow up as documented in patient record.   4. At risk for diabetes mellitus Nicole Schmitt was given approximately 30 minutes of diabetes education and counseling today. We discussed intensive lifestyle modifications today with an emphasis on weight loss as well as increasing exercise and decreasing simple carbohydrates in her diet. We also reviewed medication options with an emphasis on risk versus benefit of those discussed.   Repetitive spaced learning was employed today to elicit superior memory formation and behavioral change.  5. Class 3 severe obesity with serious comorbidity and body mass index (BMI) of 40.0 to 44.9 in adult, unspecified obesity type (HCC) Nicole Schmitt is currently in the action stage of change. As such, her goal is to continue with weight loss efforts. She has agreed to the Category 3 Plan.   Exercise goals: No exercise has been prescribed at this time.  Behavioral modification strategies: increasing lean protein intake, meal planning and cooking strategies, keeping healthy foods in the  home and planning for success.  Nicole Schmitt has agreed to follow-up with our clinic in 2 weeks. She was informed of the importance of frequent follow-up visits to maximize her success with intensive lifestyle modifications for her multiple health conditions.   Objective:   Blood pressure 135/81, pulse 76, temperature 98 F  (36.7 C), temperature source Oral, height 5\' 5"  (1.651 m), weight 250 lb (113.4 kg), SpO2 96 %. Body mass index is 41.6 kg/m.  General: Cooperative, alert, well developed, in no acute distress. HEENT: Conjunctivae and lids unremarkable. Cardiovascular: Regular rhythm.  Lungs: Normal work of breathing. Neurologic: No focal deficits.   Lab Results  Component Value Date   CREATININE 0.64 08/04/2019   BUN 14 08/04/2019   NA 143 08/04/2019   K 4.2 08/04/2019   CL 99 08/04/2019   CO2 26 08/04/2019   Lab Results  Component Value Date   ALT 12 08/04/2019   AST 14 08/04/2019   ALKPHOS 142 (H) 08/04/2019   BILITOT 0.3 08/04/2019   Lab Results  Component Value Date   HGBA1C 5.6 08/04/2019   Lab Results  Component Value Date   INSULIN 24.2 08/04/2019   Lab Results  Component Value Date   TSH 2.980 08/04/2019   Lab Results  Component Value Date   CHOL 176 08/04/2019   HDL 48 08/04/2019   LDLCALC 112 (H) 08/04/2019   TRIG 89 08/04/2019   Lab Results  Component Value Date   WBC 8.3 08/04/2019   HGB 13.6 08/04/2019   HCT 42.7 08/04/2019   MCV 89 08/04/2019   PLT 305 08/04/2019   No results found for: IRON, TIBC, FERRITIN  Attestation Statements:   Reviewed by clinician on day of visit: allergies, medications, problem list, medical history, surgical history, family history, social history, and previous encounter notes.   I, Trixie Dredge, am acting as transcriptionist for Coralie Common, MD.  I have reviewed the above documentation for accuracy and completeness, and I agree with the above. - Jinny Blossom, MD

## 2019-08-28 ENCOUNTER — Ambulatory Visit (INDEPENDENT_AMBULATORY_CARE_PROVIDER_SITE_OTHER): Payer: BC Managed Care – PPO | Admitting: Family Medicine

## 2019-08-28 ENCOUNTER — Encounter (INDEPENDENT_AMBULATORY_CARE_PROVIDER_SITE_OTHER): Payer: Self-pay

## 2019-08-28 ENCOUNTER — Other Ambulatory Visit: Payer: Self-pay

## 2019-09-08 ENCOUNTER — Encounter (INDEPENDENT_AMBULATORY_CARE_PROVIDER_SITE_OTHER): Payer: Self-pay | Admitting: Family Medicine

## 2019-09-08 ENCOUNTER — Ambulatory Visit (INDEPENDENT_AMBULATORY_CARE_PROVIDER_SITE_OTHER): Payer: BC Managed Care – PPO | Admitting: Family Medicine

## 2019-09-08 ENCOUNTER — Other Ambulatory Visit: Payer: Self-pay

## 2019-09-08 VITALS — BP 130/79 | HR 80 | Temp 98.0°F | Ht 65.0 in | Wt 249.0 lb

## 2019-09-08 DIAGNOSIS — Z6841 Body Mass Index (BMI) 40.0 and over, adult: Secondary | ICD-10-CM

## 2019-09-08 DIAGNOSIS — Z9189 Other specified personal risk factors, not elsewhere classified: Secondary | ICD-10-CM | POA: Diagnosis not present

## 2019-09-08 DIAGNOSIS — E559 Vitamin D deficiency, unspecified: Secondary | ICD-10-CM | POA: Diagnosis not present

## 2019-09-08 DIAGNOSIS — I10 Essential (primary) hypertension: Secondary | ICD-10-CM | POA: Diagnosis not present

## 2019-09-08 MED ORDER — VITAMIN D (ERGOCALCIFEROL) 1.25 MG (50000 UNIT) PO CAPS: 50000.0000 [IU] | ORAL_CAPSULE | ORAL | 0 refills | Status: DC

## 2019-09-09 ENCOUNTER — Other Ambulatory Visit (INDEPENDENT_AMBULATORY_CARE_PROVIDER_SITE_OTHER): Payer: Self-pay | Admitting: Family Medicine

## 2019-09-09 DIAGNOSIS — E559 Vitamin D deficiency, unspecified: Secondary | ICD-10-CM

## 2019-09-15 NOTE — Progress Notes (Signed)
Chief Complaint:   OBESITY Gabrielle is here to discuss her progress with her obesity treatment plan along with follow-up of her obesity related diagnoses. Gwendalyn is on the Category 3 Plan and states she is following her eating plan approximately 80-90% of the time. Harsimran states she is swimming 6 times per week.  Today's visit was #: 3 Starting weight: 257 lbs Starting date: 08/04/2019 Today's weight: 249 lbs Today's date: 09/08/2019 Total lbs lost to date: 8 Total lbs lost since last in-office visit: 1  Interim History: Sesilia went on vacation last week, and she did not eat all the food on the plan. She notes quantity is quite a bit at dinner. She is making substitutions with protein bar or yogurt after dinner. She does voices that while on vacation she would skip lunch frequently.  Subjective:   1. Vitamin D deficiency Raeven denies nausea, vomiting, or muscle weakness, but she notes fatigue. She is on prescription Vit D.  2. Essential hypertension Erminie's blood pressure is well controlled today. She denies chest pain, chest pressure, or headaches.  3. At risk for osteoporosis Sheridan is at higher risk of osteopenia and osteoporosis due to Vitamin D deficiency.   Assessment/Plan:   1. Vitamin D deficiency Low Vitamin D level contributes to fatigue and are associated with obesity, breast, and colon cancer. We will refill prescription Vitamin D for 1 month. Jozette will follow-up for routine testing of Vitamin D, at least 2-3 times per year to avoid over-replacement.  - Vitamin D, Ergocalciferol, (DRISDOL) 1.25 MG (50000 UNIT) CAPS capsule; Take 1 capsule (50,000 Units total) by mouth every 7 (seven) days.  Dispense: 4 capsule; Refill: 0  2. Essential hypertension Joyanna is working on healthy weight loss and exercise to improve blood pressure control. We will watch for signs of hypotension as she continues her lifestyle modifications. Mansi will continue losartan and  hydrochlorothiazide, no refill needed.  3. At risk for osteoporosis Tammy was given approximately 15 minutes of osteoporosis prevention counseling today. Tashiana is at risk for osteopenia and osteoporosis due to her Vitamin D deficiency. She was encouraged to take her Vitamin D and follow her higher calcium diet and increase strengthening exercise to help strengthen her bones and decrease her risk of osteopenia and osteoporosis.  Repetitive spaced learning was employed today to elicit superior memory formation and behavioral change.  4. Class 3 severe obesity with serious comorbidity and body mass index (BMI) of 40.0 to 44.9 in adult, unspecified obesity type (HCC) Markesia is currently in the action stage of change. As such, her goal is to continue with weight loss efforts. She has agreed to the Category 3 Plan.   Exercise goals: All adults should avoid inactivity. Some physical activity is better than none, and adults who participate in any amount of physical activity gain some health benefits.  Behavioral modification strategies: increasing lean protein intake, increasing vegetables, no skipping meals, meal planning and cooking strategies and keeping healthy foods in the home.  Shandie has agreed to follow-up with our clinic in 2 weeks. She was informed of the importance of frequent follow-up visits to maximize her success with intensive lifestyle modifications for her multiple health conditions.   Objective:   Blood pressure 130/79, pulse 80, temperature 98 F (36.7 C), temperature source Oral, height 5\' 5"  (1.651 m), weight 249 lb (112.9 kg), SpO2 98 %. Body mass index is 41.44 kg/m.  General: Cooperative, alert, well developed, in no acute distress. HEENT: Conjunctivae and lids  unremarkable. Cardiovascular: Regular rhythm.  Lungs: Normal work of breathing. Neurologic: No focal deficits.   Lab Results  Component Value Date   CREATININE 0.64 08/04/2019   BUN 14 08/04/2019   NA 143  08/04/2019   K 4.2 08/04/2019   CL 99 08/04/2019   CO2 26 08/04/2019   Lab Results  Component Value Date   ALT 12 08/04/2019   AST 14 08/04/2019   ALKPHOS 142 (H) 08/04/2019   BILITOT 0.3 08/04/2019   Lab Results  Component Value Date   HGBA1C 5.6 08/04/2019   Lab Results  Component Value Date   INSULIN 24.2 08/04/2019   Lab Results  Component Value Date   TSH 2.980 08/04/2019   Lab Results  Component Value Date   CHOL 176 08/04/2019   HDL 48 08/04/2019   LDLCALC 112 (H) 08/04/2019   TRIG 89 08/04/2019   Lab Results  Component Value Date   WBC 8.3 08/04/2019   HGB 13.6 08/04/2019   HCT 42.7 08/04/2019   MCV 89 08/04/2019   PLT 305 08/04/2019   No results found for: IRON, TIBC, FERRITIN  Attestation Statements:   Reviewed by clinician on day of visit: allergies, medications, problem list, medical history, surgical history, family history, social history, and previous encounter notes.   I, Trixie Dredge, am acting as transcriptionist for Coralie Common, MD.  I have reviewed the above documentation for accuracy and completeness, and I agree with the above. - Jinny Blossom, MD

## 2019-09-22 ENCOUNTER — Other Ambulatory Visit: Payer: Self-pay

## 2019-09-22 ENCOUNTER — Encounter (INDEPENDENT_AMBULATORY_CARE_PROVIDER_SITE_OTHER): Payer: Self-pay | Admitting: Family Medicine

## 2019-09-22 ENCOUNTER — Ambulatory Visit (INDEPENDENT_AMBULATORY_CARE_PROVIDER_SITE_OTHER): Payer: BC Managed Care – PPO | Admitting: Family Medicine

## 2019-09-22 VITALS — BP 105/71 | HR 67 | Temp 98.0°F | Ht 65.0 in | Wt 246.0 lb

## 2019-09-22 DIAGNOSIS — E559 Vitamin D deficiency, unspecified: Secondary | ICD-10-CM

## 2019-09-22 DIAGNOSIS — Z6841 Body Mass Index (BMI) 40.0 and over, adult: Secondary | ICD-10-CM

## 2019-09-22 DIAGNOSIS — E66813 Obesity, class 3: Secondary | ICD-10-CM

## 2019-09-22 DIAGNOSIS — I10 Essential (primary) hypertension: Secondary | ICD-10-CM

## 2019-09-23 NOTE — Progress Notes (Signed)
Chief Complaint:   OBESITY Nicole Schmitt is here to discuss her progress with her obesity treatment plan along with follow-up of her obesity related diagnoses. Nicole Schmitt is on the Category 3 Plan and states she is following her eating plan approximately 100% of the time. Nicole Schmitt states she is exercising for 0 minutes 0 times per week.  Today's visit was #: 4 Starting weight: 257 lbs Starting date: 08/04/2019 Today's weight: 246 lbs Today's date: 09/22/2019 Total lbs lost to date: 11 lbs Total lbs lost since last in-office visit: 3 lbs  Interim History: Nicole Schmitt says she started writing things down over the past 2 weeks.  Prior to that, she was skipping meals, but only for a couple of days.  She does not know what she did differently this past time to lose weight.  She says she cannot eat 10 ounces of meat at night, so she will drink milk or eat yogurt or cheese to make up for it.  She is a Marine scientist at [employer], lives in [place], and travels over 1 hour here daily to work.  She is very busy and overall probably not getting in all of her proteins/foods that she may be thinking she is.  Subjective:   1. Essential hypertension Review: taking medications as instructed, no medication side effects noted, no chest pain on exertion, no dyspnea on exertion, no swelling of ankles.  She is checking her blood pressure at home and it has been in the 120s/70s.  Denies concerns.  Well-controlled on losartan nightly.  Tolerating well without side effects.  BP Readings from Last 3 Encounters:  09/22/19 105/71  09/08/19 130/79  08/28/19 125/75   2. Vitamin D deficiency Nicole Schmitt's Vitamin D level was 18.4 on 08/04/2019. She is currently taking prescription vitamin D 50,000 IU each week. She denies nausea, vomiting or muscle weakness.  No problem with medication.  Tolerating well.  Assessment/Plan:   1. Essential hypertension Nicole Schmitt is working on healthy weight loss and exercise to improve blood pressure control. We will watch  for signs of hypotension as she continues her lifestyle modifications.  Continue medication per PCP.  We will continue close monitoring alongside them.  Continue prudent nutritional plan and weight loss.  2. Vitamin D deficiency Low Vitamin D level contributes to fatigue and are associated with obesity, breast, and colon cancer. She agrees to continue to take prescription Vitamin D @50 ,000 IU every week and will follow-up for routine testing of Vitamin D, at least 2-3 times per year to avoid over-replacement.  Recheck vitamin D level every 3-4 months.  Continue prudent nutritional plan, weight loss.  3. Class 3 severe obesity with serious comorbidity and body mass index (BMI) of 40.0 to 44.9 in adult, unspecified obesity type (HCC) Nicole Schmitt is currently in the action stage of change. As such, her goal is to continue with weight loss efforts. She has agreed to the Category 3 Plan.   Exercise goals: As is.  Behavioral modification strategies: increasing lean protein intake, increasing water intake, no skipping meals, meal planning and cooking strategies, keeping healthy foods in the home and planning for success.  Nicole Schmitt has agreed to follow-up with our clinic in 2 weeks. She was informed of the importance of frequent follow-up visits to maximize her success with intensive lifestyle modifications for her multiple health conditions.   Objective:   Blood pressure 105/71, pulse 67, temperature 98 F (36.7 C), height 5\' 5"  (1.651 m), weight (!) 246 lb (111.6 kg), SpO2 97 %. Body  mass index is 40.94 kg/m.  General: Cooperative, alert, well developed, in no acute distress. HEENT: Conjunctivae and lids unremarkable. Cardiovascular: Regular rhythm.  Lungs: Normal work of breathing. Neurologic: No focal deficits.   Lab Results  Component Value Date   CREATININE 0.64 08/04/2019   BUN 14 08/04/2019   NA 143 08/04/2019   K 4.2 08/04/2019   CL 99 08/04/2019   CO2 26 08/04/2019   Lab Results    Component Value Date   ALT 12 08/04/2019   AST 14 08/04/2019   ALKPHOS 142 (H) 08/04/2019   BILITOT 0.3 08/04/2019   Lab Results  Component Value Date   HGBA1C 5.6 08/04/2019   Lab Results  Component Value Date   INSULIN 24.2 08/04/2019   Lab Results  Component Value Date   TSH 2.980 08/04/2019   Lab Results  Component Value Date   CHOL 176 08/04/2019   HDL 48 08/04/2019   LDLCALC 112 (H) 08/04/2019   TRIG 89 08/04/2019   Lab Results  Component Value Date   WBC 8.3 08/04/2019   HGB 13.6 08/04/2019   HCT 42.7 08/04/2019   MCV 89 08/04/2019   PLT 305 08/04/2019   Attestation Statements:   Reviewed by clinician on day of visit: allergies, medications, problem list, medical history, surgical history, family history, social history, and previous encounter notes.  Time spent on visit including pre-visit chart review and post-visit care and charting was 35 minutes.   I, Water quality scientist, CMA, am acting as Location manager for [employer], DO.  I have reviewed the above documentation for accuracy and completeness, and I agree with the above. Mellody Dance, DO

## 2019-09-24 ENCOUNTER — Other Ambulatory Visit: Payer: Self-pay | Admitting: Hematology

## 2019-09-24 DIAGNOSIS — Z170 Estrogen receptor positive status [ER+]: Secondary | ICD-10-CM

## 2019-10-09 ENCOUNTER — Other Ambulatory Visit (INDEPENDENT_AMBULATORY_CARE_PROVIDER_SITE_OTHER): Payer: Self-pay | Admitting: Family Medicine

## 2019-10-09 DIAGNOSIS — E559 Vitamin D deficiency, unspecified: Secondary | ICD-10-CM

## 2019-10-09 NOTE — Telephone Encounter (Signed)
Prescription protocol request sent to Dr Raliegh Scarlet.

## 2019-10-16 ENCOUNTER — Ambulatory Visit (INDEPENDENT_AMBULATORY_CARE_PROVIDER_SITE_OTHER): Payer: BC Managed Care – PPO | Admitting: Family Medicine

## 2019-11-21 ENCOUNTER — Other Ambulatory Visit: Payer: Self-pay

## 2019-11-21 ENCOUNTER — Ambulatory Visit
Admission: RE | Admit: 2019-11-21 | Discharge: 2019-11-21 | Disposition: A | Discharge status: Home / Self Care | Payer: BC Managed Care – PPO | Source: Ambulatory Visit | Attending: Hematology | Admitting: Hematology

## 2019-11-21 DIAGNOSIS — Z170 Estrogen receptor positive status [ER+]: Secondary | ICD-10-CM

## 2019-12-23 ENCOUNTER — Ambulatory Visit: Payer: BC Managed Care – PPO | Attending: Hematology

## 2019-12-23 DIAGNOSIS — Z23 Encounter for immunization: Secondary | ICD-10-CM

## 2019-12-23 NOTE — Progress Notes (Signed)
   Covid-19 Vaccination Clinic  Name:  PAIGE MONARREZ    MRN: [id number] DOB: January 22, 1961  12/23/2019  Ms. Gravatt was observed post Covid-19 immunization for 15 minutes without incident. She was provided with Vaccine Information sheet and instruction to access the V-Safe system.   Ms. Lenig was instructed to call 911 with any severe reactions post vaccine: Marland Kitchen Difficulty breathing  . Swelling of face and throat  . A fast heartbeat  . A bad rash all over body  . Dizziness and weakness   Immunizations Administered    Name Date Dose VIS Date [place] COVID-19 Vaccine 12/23/2019 11:50 AM 0.3 mL 04/23/2018 Intramuscular   Manufacturer: [place]   Lot: [id number]   [place]: [id number]

## 2019-12-23 NOTE — Progress Notes (Signed)
Consent given by pt.  Murvin Natal, RN-BSN-CCM   Covid-19 Vaccination Clinic  Name:  Nicole Schmitt    MRN: [id number] DOB: 10/29/1960  12/23/2019  Nicole Schmitt was observed post Covid-19 immunization for 15 minutes without incident. She was provided with Vaccine Information Sheet and instruction to access the V-Safe system.   Nicole Schmitt was instructed to call 911 with any severe reactions post vaccine:  Difficulty breathing   Swelling of face and throat   A fast heartbeat   A bad rash all over body   Dizziness and weakness   Immunizations Administered    Name Date Dose VIS Date [place] COVID-19 Vaccine 12/23/2019 11:50 AM 0.3 mL 04/23/2018 Intramuscular   Manufacturer: [place]   Lot: [id number]   [place]: [id number]

## 2019-12-31 NOTE — Progress Notes (Signed)
[place]   Telephone:(336) [phone] Fax:(336) [phone]   Clinic Follow up Note   Patient Care Team: Andres Shad, MD as PCP - General (Family Medicine) [name], MD as Consulting Physician (Hematology) Tyler Pita, MD as Consulting Physician (Radiation Oncology) Excell Seltzer, MD (Inactive) as Consulting Physician (General Surgery) Gardenia Phlegm, NP as Nurse Practitioner (Hematology and Oncology)  Date of Service:  01/02/2020  CHIEF COMPLAINT: Follow up left breast cancer  SUMMARY OF ONCOLOGIC HISTORY: Oncology History Overview Note  Breast cancer of upper-outer quadrant of left female breast [facility])   Staging form: Breast, AJCC 7th Edition   - Pathologic: Stage IIA (T2, N0, cM0) - Unsigned    Breast cancer of upper-outer quadrant of left female breast ([place])  10/20/2015 Mammogram   Screening mammogram showed new nodular density on the left breast, measuring about 2 cm. the well defined nodules bilaterally are otherwise stable.   10/27/2015 Initial Biopsy   Left breast 2:00 position mass biopsy showed infiltrating ductal carcinoma.    10/27/2015 Initial Diagnosis   Breast cancer of upper-outer quadrant of left female breast ([place])   10/27/2015 Receptors her2   ER 100% positive, strong staining, PR 90% positive, strong staining, HER-2 IHC 2+, FISH negative.   11/16/2015 Surgery   Left breast lumpectomy and sentinel lymph node biopsy.   11/16/2015 Pathology Results   Left breast lumpectomy showed invasive grade 3 ductal carcinoma, 2.2 cm, intermediate grade DCIS. Margins were negative. One sentinel lymph node was negative. Lymphovascular invasion was negative.   11/16/2015 Oncotype testing   RS 23, intermediate risk, which predicts 10 year risk of distant recurrence 15% with tamoxifen   12/23/2015 - 03/01/2016 Adjuvant Chemotherapy   Docetaxel and Cytoxan, every 3 weeks, with Neulasta on day 2, for 4 cycles   03/27/2016 - 05/10/2016  Radiation Therapy   Adjuvant breast radiation 03/27/16 - 05/10/16 60.4 Gy in 33 fractions to the left breast by Dr. Tammi Klippel.   05/31/2016 -  Anti-estrogen oral therapy   Letrozole 2.5 mg daily       CURRENT THERAPY:  Letrozole 2.5 mg, 1 tablet daily, started 05/31/16  INTERVAL HISTORY:  Nicole Schmitt is here for a follow up of left breast cancer. She was last seen by em 6 months ago. She presents to the clinic alone. She notes she is doing well. She denies any major concerns. She denies pain in her breast. She notes burning her breast skin due to dropping hot food on her.  She is on Letrozole. She has mild joint pain, manageable. She notes she went to [facility] weight management but did not keep up with her diet plan. She notes her hot flashes are mild. She notes her neuropathy is improved in her feet but stable in her hands. This is manageable.     REVIEW OF SYSTEMS:   Constitutional: Denies fevers, chills or abnormal weight loss Eyes: Denies blurriness of vision Ears, nose, mouth, throat, and face: Denies mucositis or sore throat Respiratory: Denies cough, dyspnea or wheezes Cardiovascular: Denies palpitation, chest discomfort or lower extremity swelling Gastrointestinal:  Denies nausea, heartburn or change in bowel habits Skin: Denies abnormal skin rashes MSK: (+) joint pain  Lymphatics: Denies new lymphadenopathy or easy bruising Neurological:Denies numbness, tingling or new weaknesses Behavioral/Psych: Mood is stable, no new changes  All other systems were reviewed with the patient and are negative.  MEDICAL HISTORY:  Past Medical History:  Diagnosis Date  . Breast cancer ([place]) 2017   Left  Breast Cancer  . Cancer ([place]) 1990   cervical   . Constipation   . Heartburn   . Hypertension   . Joint pain   . Palpitations   . Personal history of chemotherapy 2017   Left Breast Cancer  . Personal history of radiation therapy 2017   Left Breast Cancer  . Seizures ([facility])    > 20  years following fall from horse  . SOB (shortness of breath)   . Swelling of both lower extremities   . Vertigo     SURGICAL HISTORY: Past Surgical History:  Procedure Laterality Date  . BREAST LUMPECTOMY Left 11/16/2015   Malignant  . BREAST LUMPECTOMY WITH RADIOACTIVE SEED AND SENTINEL LYMPH NODE BIOPSY Left 11/16/2015   Procedure: BREAST LUMPECTOMY WITH RADIOACTIVE SEED AND SENTINEL LYMPH NODE BIOPSY;  Surgeon: Excell Seltzer, MD;  Location: [place];  Service: General;  Laterality: Left;  . CESAREAN SECTION  03/2001  . COLONOSCOPY    . WISDOM TOOTH EXTRACTION      I have reviewed the social history and family history with the patient and they are unchanged from previous note.  ALLERGIES:  has No Known Allergies.  MEDICATIONS:  Current Outpatient Medications  Medication Sig Dispense Refill  . Acetaminophen (TYLENOL PO) Take by mouth.    Marland Kitchen CLINDAGEL 1 % gel Apply 1 application topically daily as needed.     . hydrochlorothiazide (HYDRODIURIL) 25 MG tablet Take 25 mg by mouth daily. 12.5 mg to 25 mg dependent on bp medication    . IBUPROFEN PO Take by mouth.    . letrozole (FEMARA) 2.5 MG tablet TAKE 1 TABLET BY MOUTH EVERY DAY 90 tablet 3  . losartan (COZAAR) 50 MG tablet Take 50 mg by mouth daily.    . Vitamin D, Ergocalciferol, (DRISDOL) 1.25 MG (50000 UNIT) CAPS capsule Take 1 capsule (50,000 Units total) by mouth every 7 (seven) days. 4 capsule 0   No current facility-administered medications for this visit.    PHYSICAL EXAMINATION: ECOG PERFORMANCE STATUS: 0 - Asymptomatic  Vitals:   01/02/20 1046  BP: 122/89  Pulse: 75  Resp: 18  Temp: 97.8 F (36.6 C)  SpO2: 99%   Filed Weights   01/02/20 1046  Weight: 258 lb 11.2 oz (117.3 kg)    GENERAL:alert, no distress and comfortable SKIN: skin color, texture, turgor are normal, no rashes or significant lesions EYES: normal, Conjunctiva are pink and non-injected, sclera clear  NECK: supple,  thyroid normal size, non-tender, without nodularity LYMPH:  no palpable lymphadenopathy in the cervical, axillary  LUNGS: clear to auscultation and percussion with normal breathing effort HEART: regular rate & rhythm and no murmurs and no lower extremity edema ABDOMEN:abdomen soft, non-tender and normal bowel sounds Musculoskeletal:no cyanosis of digits and no clubbing  NEURO: alert & oriented x 3 with fluent speech, no focal motor/sensory deficits BREAST: S/p left lumpectomy: Surgical incision with mild swelling of lateral breast. Right Breast exam benign.   LABORATORY DATA:  I have reviewed the data as listed CBC Latest Ref Rng & Units 01/02/2020 08/04/2019 07/02/2019  WBC 4.0 - 10.5 K/uL 8.4 8.3 8.4  Hemoglobin 12.0 - 15.0 g/dL 12.6 13.6 13.5  Hematocrit 36 - 46 % 38.0 42.7 41.3  Platelets 150 - 400 K/uL 252 305 287     CMP Latest Ref Rng & Units 01/02/2020 08/04/2019 07/02/2019  Glucose 70 - 99 mg/dL 98 96 95  BUN 6 - 20 mg/dL '20 14 12  ' Creatinine 0.44 -  1.00 mg/dL 0.69 0.64 0.73  Sodium 135 - 145 mmol/L 140 143 139  Potassium 3.5 - 5.1 mmol/L 3.7 4.2 3.8  Chloride 98 - 111 mmol/L 108 99 105  CO2 22 - 32 mmol/L '24 26 27  ' Calcium 8.9 - 10.3 mg/dL 9.0 9.4 9.5  Total Protein 6.5 - 8.1 g/dL 6.8 7.1 7.2  Total Bilirubin 0.3 - 1.2 mg/dL 0.5 0.3 0.5  Alkaline Phos 38 - 126 U/L 99 142(H) 113  AST 15 - 41 U/L 15 14 14(L)  ALT 0 - 44 U/L '13 12 12      ' RADIOGRAPHIC STUDIES: I have personally reviewed the radiological images as listed and agreed with the findings in the report. No results found.   ASSESSMENT & PLAN:  DAMEISHA TSCHIDA is a 59 y.o. female with     1. 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 She is s/p left breastlumpectomy, and adjuvant radiation. -she received adjuvant chemo TC for 4 cycles -She started anti-estrogen therapy  with Letrozole in 05/2016. Tolerating well overall except mild arthralgia and stiffness, if this worsens we will considerswitching toexemestane. -She is clinically doing well, no concerning symptoms. Lab reviewed, her CBC and CMP are within normal limits. Her physical exam and her 10/2019 mammogram were unremarkable. There is no clinical concern for recurrence. -Continue Surveillance. Next mammogram in 10/2020 -Continue letrozole  -F/u in6 months   2. Peripheral neuropathy, grade 1, Secondary to adjuvant chemotherapy -Improved overall, mainly stable in feet. Manageable   3. HTN -Follow-up of his primary care physician and cardiologist.   4. Osteopenia -Her Bone density from 10/20/16 shows she is slightly osteopenic, her T-score -1.1. Stable osteopenia on 10/2018 DEXA (-1.1 T-score) -Iagainsuggestedshe takes calcium, vitamin D and exercise regularly.  5. Obesity  -Ipreviouslydiscussed how letrozole can cause her to gain weight. -She has tried weight watchers, Noom, oral Saxenda, [facility] weight and management. -she was seen by [facility] clinic and decided not to return, she knows what to do about her diet and exercise -She notes she plans to return to a routine diet. I also strongly encouraged her to increase her exercise and eat less carbs in diet.   6. Arthralgia, Hot flashes, secondary to Letrozole -Manageable and stable.    PLAN -Continue letrozole  -she is clinically doing well, no concern for recurrence  -Lab and f/u in 6 months     No problem-specific Assessment & Plan notes found for this encounter.   No orders of the defined types were placed in this encounter.  All questions were answered. The patient knows to call the clinic with any problems, questions or concerns. No barriers to learning was detected. The total time spent in the appointment was 25 minutes.     [name], MD 01/02/2020   I, Joslyn Devon,  am acting as scribe for [name], MD.   I have reviewed the above documentation for accuracy and completeness, and I agree with the above.

## 2020-01-02 ENCOUNTER — Encounter: Payer: Self-pay | Admitting: Hematology

## 2020-01-02 ENCOUNTER — Inpatient Hospital Stay: Payer: BC Managed Care – PPO

## 2020-01-02 ENCOUNTER — Other Ambulatory Visit: Payer: Self-pay

## 2020-01-02 ENCOUNTER — Inpatient Hospital Stay: Payer: BC Managed Care – PPO | Attending: Hematology | Admitting: Hematology

## 2020-01-02 VITALS — BP 122/89 | HR 75 | Temp 97.8°F | Resp 18 | Ht 65.0 in | Wt 258.7 lb

## 2020-01-02 DIAGNOSIS — I10 Essential (primary) hypertension: Secondary | ICD-10-CM | POA: Insufficient documentation

## 2020-01-02 DIAGNOSIS — Z79899 Other long term (current) drug therapy: Secondary | ICD-10-CM | POA: Insufficient documentation

## 2020-01-02 DIAGNOSIS — Z9221 Personal history of antineoplastic chemotherapy: Secondary | ICD-10-CM | POA: Insufficient documentation

## 2020-01-02 DIAGNOSIS — M8580 Other specified disorders of bone density and structure, unspecified site: Secondary | ICD-10-CM | POA: Diagnosis not present

## 2020-01-02 DIAGNOSIS — R232 Flushing: Secondary | ICD-10-CM | POA: Insufficient documentation

## 2020-01-02 DIAGNOSIS — Z170 Estrogen receptor positive status [ER+]: Secondary | ICD-10-CM | POA: Diagnosis not present

## 2020-01-02 DIAGNOSIS — Z79811 Long term (current) use of aromatase inhibitors: Secondary | ICD-10-CM | POA: Insufficient documentation

## 2020-01-02 DIAGNOSIS — E669 Obesity, unspecified: Secondary | ICD-10-CM | POA: Diagnosis not present

## 2020-01-02 DIAGNOSIS — M2550 Pain in unspecified joint: Secondary | ICD-10-CM | POA: Diagnosis not present

## 2020-01-02 DIAGNOSIS — C50412 Malignant neoplasm of upper-outer quadrant of left female breast: Secondary | ICD-10-CM | POA: Insufficient documentation

## 2020-01-02 DIAGNOSIS — G629 Polyneuropathy, unspecified: Secondary | ICD-10-CM | POA: Insufficient documentation

## 2020-01-02 DIAGNOSIS — Z923 Personal history of irradiation: Secondary | ICD-10-CM | POA: Insufficient documentation

## 2020-01-02 LAB — COMPREHENSIVE METABOLIC PANEL
ALT: 13 U/L (ref 0–44)
AST: 15 U/L (ref 15–41)
Albumin: 3.5 g/dL (ref 3.5–5.0)
Alkaline Phosphatase: 99 U/L (ref 38–126)
Anion gap: 8 (ref 5–15)
BUN: 20 mg/dL (ref 6–20)
CO2: 24 mmol/L (ref 22–32)
Calcium: 9 mg/dL (ref 8.9–10.3)
Chloride: 108 mmol/L (ref 98–111)
Creatinine, Ser: 0.69 mg/dL (ref 0.44–1.00)
GFR, Estimated: >60 mL/min (ref >60)
Glucose, Bld: 98 mg/dL (ref 70–99)
Potassium: 3.7 mmol/L (ref 3.5–5.1)
Sodium: 140 mmol/L (ref 135–145)
Total Bilirubin: 0.5 mg/dL (ref 0.3–1.2)
Total Protein: 6.8 g/dL (ref 6.5–8.1)

## 2020-01-02 LAB — CBC WITH DIFFERENTIAL/PLATELET
Abs Immature Granulocytes: 0.01 10*3/uL (ref 0.00–0.07)
Basophils Absolute: 0 10*3/uL (ref 0.0–0.1)
Basophils Relative: 1 %
Eosinophils Absolute: 0.1 10*3/uL (ref 0.0–0.5)
Eosinophils Relative: 1 %
HCT: 38 % (ref 36.0–46.0)
Hemoglobin: 12.6 g/dL (ref 12.0–15.0)
Immature Granulocytes: 0 %
Lymphocytes Relative: 35 %
Lymphs Abs: 2.9 10*3/uL (ref 0.7–4.0)
MCH: 28.9 pg (ref 26.0–34.0)
MCHC: 33.2 g/dL (ref 30.0–36.0)
MCV: 87.2 fL (ref 80.0–100.0)
Monocytes Absolute: 0.6 10*3/uL (ref 0.1–1.0)
Monocytes Relative: 7 %
Neutro Abs: 4.7 10*3/uL (ref 1.7–7.7)
Neutrophils Relative %: 56 %
Platelets: 252 10*3/uL (ref 150–400)
RBC: 4.36 MIL/uL (ref 3.87–5.11)
RDW: 13.6 % (ref 11.5–15.5)
WBC: 8.4 10*3/uL (ref 4.0–10.5)
nRBC: 0 % (ref 0.0–0.2)

## 2020-01-05 ENCOUNTER — Telehealth: Payer: Self-pay | Admitting: Hematology

## 2020-01-05 NOTE — Telephone Encounter (Signed)
Scheduled per 11/5 los. Pt is aware of appt time and date

## 2020-06-24 NOTE — Progress Notes (Incomplete)
[place]   Telephone:(336) [phone] Fax:(336) [phone]   Clinic Follow up Note   Patient Care Team: Andres Shad, MD as PCP - General (Family Medicine) [name], MD as Consulting Physician (Hematology) Tyler Pita, MD as Consulting Physician (Radiation Oncology) Excell Seltzer, MD (Inactive) as Consulting Physician (General Surgery) Gardenia Phlegm, NP as Nurse Practitioner (Hematology and Oncology)  Date of Service:  06/24/2020  CHIEF COMPLAINT: f/u of left breast cancer  SUMMARY OF ONCOLOGIC HISTORY: Oncology History Overview Note  Breast cancer of upper-outer quadrant of left female breast [facility])   Staging form: Breast, AJCC 7th Edition   - Pathologic: Stage IIA (T2, N0, cM0) - Unsigned    Breast cancer of upper-outer quadrant of left female breast ([place])  10/20/2015 Mammogram   Screening mammogram showed new nodular density on the left breast, measuring about 2 cm. the well defined nodules bilaterally are otherwise stable.   10/27/2015 Initial Biopsy   Left breast 2:00 position mass biopsy showed infiltrating ductal carcinoma.    10/27/2015 Initial Diagnosis   Breast cancer of upper-outer quadrant of left female breast ([place])   10/27/2015 Receptors her2   ER 100% positive, strong staining, PR 90% positive, strong staining, HER-2 IHC 2+, FISH negative.   11/16/2015 Surgery   Left breast lumpectomy and sentinel lymph node biopsy.   11/16/2015 Pathology Results   Left breast lumpectomy showed invasive grade 3 ductal carcinoma, 2.2 cm, intermediate grade DCIS. Margins were negative. One sentinel lymph node was negative. Lymphovascular invasion was negative.   11/16/2015 Oncotype testing   RS 23, intermediate risk, which predicts 10 year risk of distant recurrence 15% with tamoxifen   12/23/2015 - 03/01/2016 Adjuvant Chemotherapy   Docetaxel and Cytoxan, every 3 weeks, with Neulasta on day 2, for 4 cycles   03/27/2016 - 05/10/2016  Radiation Therapy   Adjuvant breast radiation 03/27/16 - 05/10/16 60.4 Gy in 33 fractions to the left breast by Dr. Tammi Klippel.   05/31/2016 -  Anti-estrogen oral therapy   Letrozole 2.5 mg daily       CURRENT THERAPY:  Letrozole 2.5 mg, 1 tablet daily, started 05/31/16  INTERVAL HISTORY: *** Nicole Schmitt is here for a follow up of breast cancer. She was last seen by me on 01/02/20. She presents to the clinic {alone/accompanied by}.  REVIEW OF SYSTEMS:  *** Constitutional: Denies fevers, chills or abnormal weight loss Eyes: Denies blurriness of vision Ears, nose, mouth, throat, and face: Denies mucositis or sore throat Respiratory: Denies cough, dyspnea or wheezes Cardiovascular: Denies palpitation, chest discomfort or lower extremity swelling Gastrointestinal:  Denies nausea, heartburn or change in bowel habits Skin: Denies abnormal skin rashes Lymphatics: Denies new lymphadenopathy or easy bruising Neurological:Denies numbness, tingling or new weaknesses Behavioral/Psych: Mood is stable, no new changes  All other systems were reviewed with the patient and are negative.  MEDICAL HISTORY:  Past Medical History:  Diagnosis Date  . Breast cancer ([place]) 2017   Left Breast Cancer  . Cancer ([place]) 1990   cervical   . Constipation   . Heartburn   . Hypertension   . Joint pain   . Palpitations   . Personal history of chemotherapy 2017   Left Breast Cancer  . Personal history of radiation therapy 2017   Left Breast Cancer  . Seizures ([facility])    > 20 years following fall from horse  . SOB (shortness of breath)   . Swelling of both lower extremities   . Vertigo  SURGICAL HISTORY: Past Surgical History:  Procedure Laterality Date  . BREAST LUMPECTOMY Left 11/16/2015   Malignant  . BREAST LUMPECTOMY WITH RADIOACTIVE SEED AND SENTINEL LYMPH NODE BIOPSY Left 11/16/2015   Procedure: BREAST LUMPECTOMY WITH RADIOACTIVE SEED AND SENTINEL LYMPH NODE BIOPSY;  Surgeon: Excell Seltzer, MD;  Location: [place];  Service: General;  Laterality: Left;  . CESAREAN SECTION  03/2001  . COLONOSCOPY    . WISDOM TOOTH EXTRACTION      I have reviewed the social history and family history with the patient and they are unchanged from previous note.  ALLERGIES:  has No Known Allergies.  MEDICATIONS:  Current Outpatient Medications  Medication Sig Dispense Refill  . Acetaminophen (TYLENOL PO) Take by mouth.    Marland Kitchen CLINDAGEL 1 % gel Apply 1 application topically daily as needed.     . hydrochlorothiazide (HYDRODIURIL) 25 MG tablet Take 25 mg by mouth daily. 12.5 mg to 25 mg dependent on bp medication    . IBUPROFEN PO Take by mouth.    . letrozole (FEMARA) 2.5 MG tablet TAKE 1 TABLET BY MOUTH EVERY DAY 90 tablet 3  . losartan (COZAAR) 50 MG tablet Take 50 mg by mouth daily.    . Vitamin D, Ergocalciferol, (DRISDOL) 1.25 MG (50000 UNIT) CAPS capsule Take 1 capsule (50,000 Units total) by mouth every 7 (seven) days. 4 capsule 0   No current facility-administered medications for this visit.    PHYSICAL EXAMINATION: ECOG PERFORMANCE STATUS: {CHL ONC ECOG PS:[id number]}  There were no vitals filed for this visit. There were no vitals filed for this visit. *** GENERAL:alert, no distress and comfortable SKIN: skin color, texture, turgor are normal, no rashes or significant lesions EYES: normal, Conjunctiva are pink and non-injected, sclera clear {OROPHARYNX:no exudate, no erythema and lips, buccal mucosa, and tongue normal}  NECK: supple, thyroid normal size, non-tender, without nodularity LYMPH:  no palpable lymphadenopathy in the cervical, axillary {or inguinal} LUNGS: clear to auscultation and percussion with normal breathing effort HEART: regular rate & rhythm and no murmurs and no lower extremity edema ABDOMEN:abdomen soft, non-tender and normal bowel sounds Musculoskeletal:no cyanosis of digits and no clubbing  NEURO: alert & oriented x 3 with fluent  speech, no focal motor/sensory deficits  LABORATORY DATA:  I have reviewed the data as listed CBC Latest Ref Rng & Units 01/02/2020 08/04/2019 07/02/2019  WBC 4.0 - 10.5 K/uL 8.4 8.3 8.4  Hemoglobin 12.0 - 15.0 g/dL 12.6 13.6 13.5  Hematocrit 36.0 - 46.0 % 38.0 42.7 41.3  Platelets 150 - 400 K/uL 252 305 287     CMP Latest Ref Rng & Units 01/02/2020 08/04/2019 07/02/2019  Glucose 70 - 99 mg/dL 98 96 95  BUN 6 - 20 mg/dL '20 14 12  ' Creatinine 0.44 - 1.00 mg/dL 0.69 0.64 0.73  Sodium 135 - 145 mmol/L 140 143 139  Potassium 3.5 - 5.1 mmol/L 3.7 4.2 3.8  Chloride 98 - 111 mmol/L 108 99 105  CO2 22 - 32 mmol/L '24 26 27  ' Calcium 8.9 - 10.3 mg/dL 9.0 9.4 9.5  Total Protein 6.5 - 8.1 g/dL 6.8 7.1 7.2  Total Bilirubin 0.3 - 1.2 mg/dL 0.5 0.3 0.5  Alkaline Phos 38 - 126 U/L 99 142(H) 113  AST 15 - 41 U/L 15 14 14(L)  ALT 0 - 44 U/L '13 12 12      ' RADIOGRAPHIC STUDIES: I have personally reviewed the radiological images as listed and agreed with the findings in the report.  No results found.   ASSESSMENT & PLAN: *** Nicole Schmitt is a 60 y.o. female with   1. 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 She is s/p left breastlumpectomy, and adjuvant radiation. -she received adjuvant chemo TC for 4 cycles -She started anti-estrogen therapy with Letrozole in 05/2016. Tolerating well overall except mild arthralgia and stiffness, if this worsens we will considerswitching toexemestane. ***  -She is clinically doing well, no concerning symptoms. Lab reviewed, her CBC and CMP are within normal limits. Her physical exam and her 10/2019 mammogram were unremarkable. There is no clinical concern for recurrence. -Continue Surveillance. Next mammogram in 10/2020 -Continue letrozole  -F/u in6 months   2. Peripheral neuropathy, grade 1, Secondary to adjuvant  chemotherapy -Improved overall, mainly stable in feet. Manageable   3. HTN -Follow-up of his primary care physician and cardiologist.   4. Osteopenia -Her Bone density from 10/20/16 shows she is slightly osteopenic, her T-score -1.1. Stable osteopenia on 10/2018 DEXA (-1.1 T-score) -Iagainsuggestedshe takes calcium, vitamin D and exercise regularly.  5. Obesity  -Ipreviouslydiscussed how letrozole can cause her to gain weight. -Shehas triedweight watchers, Noom, oral Saxenda, [facility] and [facility] weight and management. -she was seen by [facility] clinic and decided not to return, she knows what to do about her diet and exercise  6. Arthralgia, Hot flashes, secondary to Letrozole -Manageable and stable.    PLAN -Continue letrozole  -Lab and f/u in 6 months***   ***  No problem-specific Assessment & Plan notes found for this encounter.   No orders of the defined types were placed in this encounter.  All questions were answered. The patient knows to call the clinic with any problems, questions or concerns. No barriers to learning was detected. The total time spent in the appointment was {CHL ONC TIME VISIT - [id number]}.     Nicole Schmitt 06/24/2020   I, Wilburn Mylar, am acting as scribe for [name], MD.   {Add scribe attestation statement}

## 2020-07-01 ENCOUNTER — Other Ambulatory Visit: Payer: Self-pay

## 2020-07-01 ENCOUNTER — Inpatient Hospital Stay: Payer: BC Managed Care – PPO | Attending: Hematology | Admitting: Nurse Practitioner

## 2020-07-01 ENCOUNTER — Inpatient Hospital Stay: Payer: BC Managed Care – PPO

## 2020-07-01 ENCOUNTER — Encounter: Payer: Self-pay | Admitting: Nurse Practitioner

## 2020-07-01 ENCOUNTER — Telehealth: Payer: Self-pay | Admitting: Nurse Practitioner

## 2020-07-01 VITALS — BP 119/70 | HR 64 | Temp 97.8°F | Resp 17 | Ht 65.0 in | Wt 266.8 lb

## 2020-07-01 DIAGNOSIS — Z79811 Long term (current) use of aromatase inhibitors: Secondary | ICD-10-CM | POA: Insufficient documentation

## 2020-07-01 DIAGNOSIS — Z170 Estrogen receptor positive status [ER+]: Secondary | ICD-10-CM | POA: Insufficient documentation

## 2020-07-01 DIAGNOSIS — Z923 Personal history of irradiation: Secondary | ICD-10-CM | POA: Insufficient documentation

## 2020-07-01 DIAGNOSIS — C50412 Malignant neoplasm of upper-outer quadrant of left female breast: Secondary | ICD-10-CM

## 2020-07-01 DIAGNOSIS — E2839 Other primary ovarian failure: Secondary | ICD-10-CM | POA: Diagnosis not present

## 2020-07-01 DIAGNOSIS — Z9221 Personal history of antineoplastic chemotherapy: Secondary | ICD-10-CM | POA: Insufficient documentation

## 2020-07-01 LAB — CBC WITH DIFFERENTIAL/PLATELET
Abs Immature Granulocytes: 0.02 10*3/uL (ref 0.00–0.07)
Basophils Absolute: 0 10*3/uL (ref 0.0–0.1)
Basophils Relative: 1 %
Eosinophils Absolute: 0.1 10*3/uL (ref 0.0–0.5)
Eosinophils Relative: 2 %
HCT: 39.4 % (ref 36.0–46.0)
Hemoglobin: 12.9 g/dL (ref 12.0–15.0)
Immature Granulocytes: 0 %
Lymphocytes Relative: 24 %
Lymphs Abs: 1.4 10*3/uL (ref 0.7–4.0)
MCH: 28.9 pg (ref 26.0–34.0)
MCHC: 32.7 g/dL (ref 30.0–36.0)
MCV: 88.1 fL (ref 80.0–100.0)
Monocytes Absolute: 0.7 10*3/uL (ref 0.1–1.0)
Monocytes Relative: 13 %
Neutro Abs: 3.5 10*3/uL (ref 1.7–7.7)
Neutrophils Relative %: 60 %
Platelets: 245 10*3/uL (ref 150–400)
RBC: 4.47 MIL/uL (ref 3.87–5.11)
RDW: 13.3 % (ref 11.5–15.5)
WBC: 5.8 10*3/uL (ref 4.0–10.5)
nRBC: 0 % (ref 0.0–0.2)

## 2020-07-01 LAB — COMPREHENSIVE METABOLIC PANEL
ALT: 12 U/L (ref 0–44)
AST: 16 U/L (ref 15–41)
Albumin: 3.7 g/dL (ref 3.5–5.0)
Alkaline Phosphatase: 99 U/L (ref 38–126)
Anion gap: 8 (ref 5–15)
BUN: 15 mg/dL (ref 6–20)
CO2: 26 mmol/L (ref 22–32)
Calcium: 9.3 mg/dL (ref 8.9–10.3)
Chloride: 106 mmol/L (ref 98–111)
Creatinine, Ser: 0.73 mg/dL (ref 0.44–1.00)
GFR, Estimated: >60 mL/min (ref >60)
Glucose, Bld: 97 mg/dL (ref 70–99)
Potassium: 3.8 mmol/L (ref 3.5–5.1)
Sodium: 140 mmol/L (ref 135–145)
Total Bilirubin: 0.5 mg/dL (ref 0.3–1.2)
Total Protein: 7.1 g/dL (ref 6.5–8.1)

## 2020-07-01 MED ORDER — LETROZOLE 2.5 MG PO TABS: 2.5000 mg | ORAL_TABLET | Freq: Every day | ORAL | 3 refills | Status: DC

## 2020-07-01 NOTE — Telephone Encounter (Signed)
Scheduled follow-up appointment per 5/5 los. Patient is aware. ?

## 2020-07-01 NOTE — [facility] Notes (Signed)
[place]   [phone] Fax:(336) [phone]   Clinic Follow up Note   Patient Care Team: Andres Shad, MD as PCP - General (Family Medicine) [name], MD as Consulting Physician (Hematology) Tyler Pita, MD as Consulting Physician (Radiation Oncology) Excell Seltzer, MD (Inactive) as Consulting Physician (General Surgery) Gardenia Phlegm, NP as Nurse Practitioner (Hematology and Oncology) 07/01/2020  CHIEF COMPLAINT: Follow-up left breast cancer  SUMMARY OF ONCOLOGIC HISTORY: Oncology History Overview Note  Breast cancer of upper-outer quadrant of left female breast [facility])   Staging form: Breast, AJCC 7th Edition   - Pathologic: Stage IIA (T2, N0, cM0) - Unsigned    Breast cancer of upper-outer quadrant of left female breast ([place])  10/20/2015 Mammogram   Screening mammogram showed new nodular density on the left breast, measuring about 2 cm. the well defined nodules bilaterally are otherwise stable.   10/27/2015 Initial Biopsy   Left breast 2:00 position mass biopsy showed infiltrating ductal carcinoma.    10/27/2015 Initial Diagnosis   Breast cancer of upper-outer quadrant of left female breast ([place])   10/27/2015 Receptors her2   ER 100% positive, strong staining, PR 90% positive, strong staining, HER-2 IHC 2+, FISH negative.   11/16/2015 Surgery   Left breast lumpectomy and sentinel lymph node biopsy.   11/16/2015 Pathology Results   Left breast lumpectomy showed invasive grade 3 ductal carcinoma, 2.2 cm, intermediate grade DCIS. Margins were negative. One sentinel lymph node was negative. Lymphovascular invasion was negative.   11/16/2015 Oncotype testing   RS 23, intermediate risk, which predicts 10 year risk of distant recurrence 15% with tamoxifen   12/23/2015 - 03/01/2016 Adjuvant Chemotherapy   Docetaxel and Cytoxan, every 3 weeks, with Neulasta on day 2, for 4 cycles   03/27/2016 - 05/10/2016 Radiation Therapy    Adjuvant breast radiation 03/27/16 - 05/10/16 60.4 Gy in 33 fractions to the left breast by Dr. Tammi Klippel.   05/31/2016 -  Anti-estrogen oral therapy   Letrozole 2.5 mg daily      CURRENT THERAPY: Letrozole 2.5 mg, 1 tablet daily, started 05/31/16  INTERVAL HISTORY: Ms. Nicole Schmitt returns for follow-up as scheduled.  She was last seen for routine surveillance visit by Dr. Burr Medico on 01/02/2020.  She continues letrozole.  She still has stiffness in her hands but is much better than when she initially began AI.  Hot flashes are moderate at night but tolerable.  Breasts are asymmetric, scar tissue in the left outer breast is unchanged.  Denies new lump/mass, nipple discharge or inversion, or skin change.  She gained weight since last visit, her dad passed away in 2022-06-07, she has had a hard time and notes she does not prioritize her weight loss or herself at times.  PCP prescribed a new weight loss med GNFAOZ, she is hesitant to start due to fear of nausea.  She has difficulty falling asleep and wakes up often.  She tried Benadryl, melatonin, and Remeron in the past, she did not tolerate due to daytime drowsiness and melatonin just did not help.  She had COVID in January after being fully vaccinated and boosted.  Symptoms were mild sore throat, headache, and body aches.  She has recovered completely.  Plans to get second booster through her work when offered.  She still has residual neuropathy from previous chemo, improved in her toes but not in her hands, overall mild.  Otherwise, denies changes in her bowel habits, GI/GYN bleeding, fever, chills, cough, chest pain, dyspnea, leg edema, or  other changes.   MEDICAL HISTORY:  Past Medical History:  Diagnosis Date  . Breast cancer ([place]) 2017   Left Breast Cancer  . Cancer ([place]) 1990   cervical   . Constipation   . Heartburn   . Hypertension   . Joint pain   . Palpitations   . Personal history of chemotherapy 2017   Left Breast Cancer  . Personal history of  radiation therapy 2017   Left Breast Cancer  . Seizures ([facility])    > 20 years following fall from horse  . SOB (shortness of breath)   . Swelling of both lower extremities   . Vertigo     SURGICAL HISTORY: Past Surgical History:  Procedure Laterality Date  . BREAST LUMPECTOMY Left 11/16/2015   Malignant  . BREAST LUMPECTOMY WITH RADIOACTIVE SEED AND SENTINEL LYMPH NODE BIOPSY Left 11/16/2015   Procedure: BREAST LUMPECTOMY WITH RADIOACTIVE SEED AND SENTINEL LYMPH NODE BIOPSY;  Surgeon: Excell Seltzer, MD;  Location: [facility];  Service: General;  Laterality: Left;  . CESAREAN SECTION  03/2001  . COLONOSCOPY    . WISDOM TOOTH EXTRACTION      I have reviewed the social history and family history with the patient and they are unchanged from previous note.  ALLERGIES:  has No Known Allergies.  MEDICATIONS:  Current Outpatient Medications  Medication Sig Dispense Refill  . Acetaminophen (TYLENOL PO) Take by mouth.    . hydrochlorothiazide (HYDRODIURIL) 25 MG tablet Take 25 mg by mouth daily. 12.5 mg to 25 mg dependent on bp medication    . IBUPROFEN PO Take by mouth.    . losartan (COZAAR) 50 MG tablet Take 50 mg by mouth daily.    Marland Kitchen tretinoin (RETIN-A) 0.025 % cream Apply topically daily.    Marland Kitchen letrozole (FEMARA) 2.5 MG tablet Take 1 tablet (2.5 mg total) by mouth daily. 90 tablet 3  . WEGOVY 0.5 MG/0.5ML SOAJ Inject 0.5 mg into the skin once a week. (Patient not taking: Reported on 07/01/2020)     No current facility-administered medications for this visit.    PHYSICAL EXAMINATION: ECOG PERFORMANCE STATUS: 1 - Symptomatic but completely ambulatory  Vitals:   07/01/20 0859  BP: 119/70  Pulse: 64  Resp: 17  Temp: 97.8 F (36.6 C)  SpO2: 98%   Filed Weights   07/01/20 0859  Weight: 266 lb 12.8 oz (121 kg)    GENERAL:alert, no distress and comfortable SKIN: No rash EYES: sclera clear NECK: Without mass LYMPH:  no palpable cervical or supraclavicular  lymphadenopathy LUNGS: clear with normal breathing effort HEART: regular rate & rhythm, no lower extremity edema ABDOMEN:abdomen soft, non-tender and normal bowel sounds NEURO: alert & oriented x 3 with fluent speech, no focal motor/sensory deficits Breast exam: No B/L nipple discharge or inversion.  S/p left lumpectomy and radiation.  Mild diffuse hyperpigmentation.  Incisions completely healed with scar tissue at the areola.  No palpable mass in either breast or axilla that I could appreciate.  LABORATORY DATA:  I have reviewed the data as listed CBC Latest Ref Rng & Units 07/01/2020 01/02/2020 08/04/2019  WBC 4.0 - 10.5 K/uL 5.8 8.4 8.3  Hemoglobin 12.0 - 15.0 g/dL 12.9 12.6 13.6  Hematocrit 36.0 - 46.0 % 39.4 38.0 42.7  Platelets 150 - 400 K/uL 245 252 305     CMP Latest Ref Rng & Units 07/01/2020 01/02/2020 08/04/2019  Glucose 70 - 99 mg/dL 97 98 96  BUN 6 - 20 mg/dL 15 20 14  Creatinine 0.44 - 1.00 mg/dL 0.73 0.69 0.64  Sodium 135 - 145 mmol/L 140 140 143  Potassium 3.5 - 5.1 mmol/L 3.8 3.7 4.2  Chloride 98 - 111 mmol/L 106 108 99  CO2 22 - 32 mmol/L _0 Calcium 8.9 - 10.3 mg/dL 9.3 9.0 9.4  Total Protein 6.5 - 8.1 g/dL 7.1 6.8 7.1  Total Bilirubin 0.3 - 1.2 mg/dL 0.5 0.5 0.3  Alkaline Phos 38 - 126 U/L 99 99 142(H)  AST 15 - 41 U/L _1 ALT 0 - 44 U/L _2 RADIOGRAPHIC STUDIES: I have personally reviewed the radiological images as listed and agreed with the findings in the report. No results found.   ASSESSMENT & PLAN: ALLENA PIETILA is a 60 y.o. female with    1. 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 Plan for  7-10 years if she tolerates   -Mammogram in 10/2019 was negative, repeat in 10/2020 -Continue surveillance and AI  2. Peripheral neuropathy, grade 1, Secondary to adjuvant chemotherapy -feet > fingers -improved over time, manageable. -previously declined gabapentin    3. HTN -Follow-up of his primary care physician and cardiologist.   4. Osteopenia -Her Bone density from 10/20/16 and 10/2018 shows she is slightly osteopenic, her T-score -1.1 -Continue calcium, vitamin D and exercise regularly. -repeat 10/2020   5. Obesity  -[employer], Noom, oral Saxenda, [facility] weight and management. -she was seen by [facility] and decided not to return -Some of her weight gain lately was due to her mourning her father's passing in March/2022 which has been hard on her -Prescribed Wegovy by PCP, has not started yet  6. Insomnia/difficulty falling asleep -She tried full-strength Benadryl, melatonin, and Remeron in the past -Melatonin did not help, Benadryl and Remeron caused daytime drowsiness -I recommend 12.5 mg Benadryl, take 12 hours before needing to be awake and alert the next day  7. COVID-19 + -she developed mild sore throat, headache, and body aches in 02/2020, tested positive  -she believes she got it at work -she has recovered completely. Plans to get 2nd booster from work     Disposition:  Ms. Milsap is clinically doing well.  She continues letrozole, tolerating well with hand stiffness and hot flashes.  Plan to continue for 7-10 years of tolerable, I refilled today.  Breast exam is benign, labs normal.  There is no clinical concern for breast cancer recurrence.  She is nearly 5 years from initial diagnosis, the recurrence risk is decreased.  Continue mammogram in 10/2020 with DEXA, surveillance, and AI.  She is reportedly up-to-date on age-appropriate cancer screenings.  I encouraged her to continue healthy  active lifestyle, avoid smoking, and limit alcohol.  She plans to try [facility] for weight loss.  I recommend 12.5 mg Benadryl PRN for sleep, will defer further management to PCP.  Return for routine lab and surveillance visit in 6 months, then annually.  All questions were answered. The patient knows to call the clinic with any problems, questions or concerns. No barriers to learning were detected.  Total encounter time is 30 minutes.  Nicole Feeling, NP 07/01/20

## 2020-11-25 ENCOUNTER — Other Ambulatory Visit: Payer: Self-pay

## 2020-11-25 ENCOUNTER — Ambulatory Visit
Admission: RE | Admit: 2020-11-25 | Discharge: 2020-11-25 | Disposition: A | Discharge status: Home / Self Care | Payer: BC Managed Care – PPO | Source: Ambulatory Visit | Attending: Nurse Practitioner | Admitting: Nurse Practitioner

## 2020-11-25 DIAGNOSIS — C50412 Malignant neoplasm of upper-outer quadrant of left female breast: Secondary | ICD-10-CM

## 2020-12-31 ENCOUNTER — Inpatient Hospital Stay: Payer: BC Managed Care – PPO | Admitting: Hematology

## 2020-12-31 ENCOUNTER — Inpatient Hospital Stay: Payer: BC Managed Care – PPO

## 2021-01-07 ENCOUNTER — Other Ambulatory Visit: Payer: Self-pay

## 2021-01-07 DIAGNOSIS — Z170 Estrogen receptor positive status [ER+]: Secondary | ICD-10-CM

## 2021-01-07 DIAGNOSIS — C50412 Malignant neoplasm of upper-outer quadrant of left female breast: Secondary | ICD-10-CM

## 2021-01-07 NOTE — [facility] Notes (Signed)
CBC w/Diff and CMP ordered.

## 2021-01-09 NOTE — [place] Notes (Signed)
[place]   Telephone:(336) [phone] Fax:(336) [phone]   Clinic Follow up Note   Patient Care Team: Andres Shad, MD as PCP - General (Family Medicine) [name], MD as Consulting Physician (Hematology) Tyler Pita, MD as Consulting Physician (Radiation Oncology) Excell Seltzer, MD (Inactive) as Consulting Physician (General Surgery) Gardenia Phlegm, NP as Nurse Practitioner (Hematology and Oncology) 01/10/2021  CHIEF COMPLAINT: Follow up left breast cancer   SUMMARY OF ONCOLOGIC HISTORY: Oncology History Overview Note  Breast cancer of upper-outer quadrant of left female breast [facility])   Staging form: Breast, AJCC 7th Edition   - Pathologic: Stage IIA (T2, N0, cM0) - Unsigned    Breast cancer of upper-outer quadrant of left female breast ([place])  10/20/2015 Mammogram   Screening mammogram showed new nodular density on the left breast, measuring about 2 cm. the well defined nodules bilaterally are otherwise stable.   10/27/2015 Initial Biopsy   Left breast 2:00 position mass biopsy showed infiltrating ductal carcinoma.    10/27/2015 Initial Diagnosis   Breast cancer of upper-outer quadrant of left female breast ([place])   10/27/2015 Receptors her2   ER 100% positive, strong staining, PR 90% positive, strong staining, HER-2 IHC 2+, FISH negative.   11/16/2015 Surgery   Left breast lumpectomy and sentinel lymph node biopsy.   11/16/2015 Pathology Results   Left breast lumpectomy showed invasive grade 3 ductal carcinoma, 2.2 cm, intermediate grade DCIS. Margins were negative. One sentinel lymph node was negative. Lymphovascular invasion was negative.   11/16/2015 Oncotype testing   RS 23, intermediate risk, which predicts 10 year risk of distant recurrence 15% with tamoxifen   12/23/2015 - 03/01/2016 Adjuvant Chemotherapy   Docetaxel and Cytoxan, every 3 weeks, with Neulasta on day 2, for 4 cycles   03/27/2016 - 05/10/2016 Radiation Therapy    Adjuvant breast radiation 03/27/16 - 05/10/16 60.4 Gy in 33 fractions to the left breast by Dr. Tammi Klippel.   05/31/2016 -  Anti-estrogen oral therapy   Letrozole 2.5 mg daily      CURRENT THERAPY: Letrozole 2.5 mg daily, started 05/31/16  INTERVAL HISTORY: Ms. [name] returns for follow up as scheduled. Last seen by me 07/01/20. Mammogram 11/25/20 was negative. A bone density scan is scheduled in December. She continues letrozole, tolerating well overall. Mild hand stiffness and hot flashes are stable.  Denies new bone or joint pain.  Her main concern is vaginal dryness which is "miserable."  She has recurrent yeast infections, on terconazole per OB/GYN. She has mild residual neuropathy from chemo, functions normally.  Denies significant anxiety or depression.  She is losing weight intentionally without medication.  She has not been consistent with calcium and vitamin D.  Denies vaginal bleeding, change in bowel habits, recent infection, new cough, chest pain, dyspnea, or any other specific complaints.   MEDICAL HISTORY:  Past Medical History:  Diagnosis Date   Breast cancer ([place]) 2017   Left Breast Cancer   Cancer [facility]) 1990   cervical    Constipation    Heartburn    Hypertension    Joint pain    Palpitations    Personal history of chemotherapy 2017   Left Breast Cancer   Personal history of radiation therapy 2017   Left Breast Cancer   Seizures ([place])    > 20 years following fall from horse   SOB (shortness of breath)    Swelling of both lower extremities    Vertigo     SURGICAL HISTORY: Past Surgical History:  Procedure Laterality Date   BREAST LUMPECTOMY Left 11/16/2015   Malignant   BREAST LUMPECTOMY WITH RADIOACTIVE SEED AND SENTINEL LYMPH NODE BIOPSY Left 11/16/2015   Procedure: BREAST LUMPECTOMY WITH RADIOACTIVE SEED AND SENTINEL LYMPH NODE BIOPSY;  Surgeon: Excell Seltzer, MD;  Location: [facility];  Service: General;  Laterality: Left;   CESAREAN SECTION   03/2001   COLONOSCOPY     WISDOM TOOTH EXTRACTION      I have reviewed the social history and family history with the patient and they are unchanged from previous note.  ALLERGIES:  has No Known Allergies.  MEDICATIONS:  Current Outpatient Medications  Medication Sig Dispense Refill   Acetaminophen (TYLENOL PO) Take by mouth.     letrozole (FEMARA) 2.5 MG tablet Take 1 tablet (2.5 mg total) by mouth daily. 90 tablet 3   losartan (COZAAR) 50 MG tablet Take 50 mg by mouth daily.     tretinoin (RETIN-A) 0.025 % cream Apply topically daily.     hydrochlorothiazide (HYDRODIURIL) 25 MG tablet Take 25 mg by mouth daily. 12.5 mg to 25 mg dependent on bp medication     IBUPROFEN PO Take by mouth.     No current facility-administered medications for this visit.    PHYSICAL EXAMINATION: ECOG PERFORMANCE STATUS: 1 - Symptomatic but completely ambulatory  Vitals:   01/10/21 1027  BP: 126/75  Pulse: 71  Resp: 17  Temp: 98.1 F (36.7 C)  SpO2: 99%   Filed Weights   01/10/21 1027  Weight: 258 lb 11.2 oz (117.3 kg)    GENERAL:alert, no distress and comfortable SKIN: no rash  EYES:  sclera clear NECK: without mass LYMPH:  no palpable cervical or supraclavicular lymphadenopathy  LUNGS: normal breathing effort HEART: no lower extremity edema ABDOMEN:abdomen soft, non-tender and normal bowel sounds Musculoskeletal: no focal tenderness  NEURO: alert & oriented x 3 with fluent speech, no focal motor/sensory deficits Breast: no bilateral nipple discharge or inversion. S/p Left lumpectomy and radiation, incisions completely healed with scar tissue nodule at the lateral edge. No palpable mass in either breast or axilla that I could appreciate.   LABORATORY DATA:  I have reviewed the data as listed CBC Latest Ref Rng & Units 01/10/2021 07/01/2020 01/02/2020  WBC 4.0 - 10.5 K/uL 8.8 5.8 8.4  Hemoglobin 12.0 - 15.0 g/dL 13.7 12.9 12.6  Hematocrit 36.0 - 46.0 % 41.7 39.4 38.0  Platelets 150 -  400 K/uL 296 245 252     CMP Latest Ref Rng & Units 01/10/2021 07/01/2020 01/02/2020  Glucose 70 - 99 mg/dL 94 97 98  BUN 6 - 20 mg/dL '20 15 20  ' Creatinine 0.44 - 1.00 mg/dL 0.72 0.73 0.69  Sodium 135 - 145 mmol/L 139 140 140  Potassium 3.5 - 5.1 mmol/L 4.0 3.8 3.7  Chloride 98 - 111 mmol/L 105 106 108  CO2 22 - 32 mmol/L '24 26 24  ' Calcium 8.9 - 10.3 mg/dL 9.6 9.3 9.0  Total Protein 6.5 - 8.1 g/dL 7.5 7.1 6.8  Total Bilirubin 0.3 - 1.2 mg/dL 0.5 0.5 0.5  Alkaline Phos 38 - 126 U/L 106 99 99  AST 15 - 41 U/L '15 16 15  ' ALT 0 - 44 U/L '14 12 13      ' RADIOGRAPHIC STUDIES: I have personally reviewed the radiological images as listed and agreed with the findings in the report. No results found.   ASSESSMENT & PLAN: Nicole Schmitt is a 60 y.o. female with  1. 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 Plan for 7-10 years if she tolerates   -Mammogram in 10/2020 was negative -she is just over 5 years from initial diagnosis, the recurrence risk has decreased.  -Continue surveillance and AI   2. Peripheral neuropathy, grade 1, Secondary to adjuvant chemotherapy -feet > fingers -improved over time, manageable. -previously declined gabapentin     3. HTN -Follow-up of his primary care physician and cardiologist.    4. Osteopenia -Her Bone density from 10/20/16 and 10/2018 shows she is slightly osteopenic, her T-score -1.1 -Continue calcium, vitamin D and exercise regularly.  -scheduled 12/1, I will call her with results    5. Obesity   -She has tried weight watchers, Noom, oral Saxenda, [facility] weight and  management. -she was seen by [facility] and decided not to return -Some of her weight gain lately was due to her mourning her father's passing in March/2022 which has been hard on her -Prescribed Wegovy by PCP but it made her sick, she has lost some weight intentionally without medication   6. Insomnia/difficulty falling asleep -She tried full-strength Benadryl, melatonin, and Remeron in the past -Melatonin did not help, Benadryl and Remeron caused daytime drowsiness -previously discussed taking 12.5 mg Benadryl, take 12 hours before needing to be awake and alert the next day  Disposition:  Ms. Diffee is clinically doing well.  Tolerating letrozole with manageable joint pain in the hands and hot flashes.  Labs normal, mammogram 11/25/2020 is benign, overall there is no clinical concern for breast cancer recurrence.  She is just over 5 years from initial diagnosis, the recurrence risk has decreased.  Continue surveillance, and AI for a total of 7-10 years.  We will proceed with DEXA 01/27/2021 as scheduled, I will call with results.  I encouraged her to be more consistent with calcium and vitamin D.  She agrees to stay up-to-date on age-appropriate vaccines and cancer screenings.  Courage her to continue healthy active lifestyle.  We reviewed signs and symptoms of recurrence.  Continue follow-up with care team and cardiology for routine/maintenance due to family history of heart disease, HTN, and left-sided breast radiation.   She will return for lab and surveillance follow-up in 6 months, or sooner if needed.    All questions were answered. The patient knows to call the clinic with any problems, questions or concerns. No barriers to learning were detected.     Alla Feeling, NP 01/10/21

## 2021-01-10 ENCOUNTER — Encounter: Payer: Self-pay | Admitting: Nurse Practitioner

## 2021-01-10 ENCOUNTER — Other Ambulatory Visit: Payer: Self-pay

## 2021-01-10 ENCOUNTER — Inpatient Hospital Stay (HOSPITAL_BASED_OUTPATIENT_CLINIC_OR_DEPARTMENT_OTHER): Payer: BC Managed Care – PPO | Admitting: Nurse Practitioner

## 2021-01-10 ENCOUNTER — Inpatient Hospital Stay: Payer: BC Managed Care – PPO | Attending: Nurse Practitioner

## 2021-01-10 VITALS — BP 126/75 | HR 71 | Temp 98.1°F | Resp 17 | Ht 65.0 in | Wt 258.7 lb

## 2021-01-10 DIAGNOSIS — E669 Obesity, unspecified: Secondary | ICD-10-CM | POA: Diagnosis not present

## 2021-01-10 DIAGNOSIS — G629 Polyneuropathy, unspecified: Secondary | ICD-10-CM | POA: Insufficient documentation

## 2021-01-10 DIAGNOSIS — I10 Essential (primary) hypertension: Secondary | ICD-10-CM | POA: Insufficient documentation

## 2021-01-10 DIAGNOSIS — M8580 Other specified disorders of bone density and structure, unspecified site: Secondary | ICD-10-CM | POA: Insufficient documentation

## 2021-01-10 DIAGNOSIS — Z923 Personal history of irradiation: Secondary | ICD-10-CM | POA: Diagnosis not present

## 2021-01-10 DIAGNOSIS — Z79811 Long term (current) use of aromatase inhibitors: Secondary | ICD-10-CM | POA: Insufficient documentation

## 2021-01-10 DIAGNOSIS — G4700 Insomnia, unspecified: Secondary | ICD-10-CM | POA: Diagnosis not present

## 2021-01-10 DIAGNOSIS — Z79899 Other long term (current) drug therapy: Secondary | ICD-10-CM | POA: Diagnosis not present

## 2021-01-10 DIAGNOSIS — Z9221 Personal history of antineoplastic chemotherapy: Secondary | ICD-10-CM | POA: Diagnosis not present

## 2021-01-10 DIAGNOSIS — C50412 Malignant neoplasm of upper-outer quadrant of left female breast: Secondary | ICD-10-CM

## 2021-01-10 DIAGNOSIS — Z170 Estrogen receptor positive status [ER+]: Secondary | ICD-10-CM | POA: Diagnosis not present

## 2021-01-10 LAB — CMP (CANCER CENTER ONLY)
ALT: 14 U/L (ref 0–44)
AST: 15 U/L (ref 15–41)
Albumin: 3.9 g/dL (ref 3.5–5.0)
Alkaline Phosphatase: 106 U/L (ref 38–126)
Anion gap: 10 (ref 5–15)
BUN: 20 mg/dL (ref 6–20)
CO2: 24 mmol/L (ref 22–32)
Calcium: 9.6 mg/dL (ref 8.9–10.3)
Chloride: 105 mmol/L (ref 98–111)
Creatinine: 0.72 mg/dL (ref 0.44–1.00)
GFR, Estimated: >60 mL/min (ref >60)
Glucose, Bld: 94 mg/dL (ref 70–99)
Potassium: 4 mmol/L (ref 3.5–5.1)
Sodium: 139 mmol/L (ref 135–145)
Total Bilirubin: 0.5 mg/dL (ref 0.3–1.2)
Total Protein: 7.5 g/dL (ref 6.5–8.1)

## 2021-01-10 LAB — CBC WITH DIFFERENTIAL (CANCER CENTER ONLY)
Abs Immature Granulocytes: 0.02 10*3/uL (ref 0.00–0.07)
Basophils Absolute: 0 10*3/uL (ref 0.0–0.1)
Basophils Relative: 0 %
Eosinophils Absolute: 0.1 10*3/uL (ref 0.0–0.5)
Eosinophils Relative: 2 %
HCT: 41.7 % (ref 36.0–46.0)
Hemoglobin: 13.7 g/dL (ref 12.0–15.0)
Immature Granulocytes: 0 %
Lymphocytes Relative: 30 %
Lymphs Abs: 2.7 10*3/uL (ref 0.7–4.0)
MCH: 28.8 pg (ref 26.0–34.0)
MCHC: 32.9 g/dL (ref 30.0–36.0)
MCV: 87.8 fL (ref 80.0–100.0)
Monocytes Absolute: 0.6 10*3/uL (ref 0.1–1.0)
Monocytes Relative: 7 %
Neutro Abs: 5.4 10*3/uL (ref 1.7–7.7)
Neutrophils Relative %: 61 %
Platelet Count: 296 10*3/uL (ref 150–400)
RBC: 4.75 MIL/uL (ref 3.87–5.11)
RDW: 13.2 % (ref 11.5–15.5)
WBC Count: 8.8 10*3/uL (ref 4.0–10.5)
nRBC: 0 % (ref 0.0–0.2)

## 2021-01-27 ENCOUNTER — Other Ambulatory Visit: Payer: BC Managed Care – PPO

## 2021-01-28 ENCOUNTER — Ambulatory Visit
Admission: RE | Admit: 2021-01-28 | Discharge: 2021-01-28 | Disposition: A | Discharge status: Home / Self Care | Payer: BC Managed Care – PPO | Source: Ambulatory Visit | Attending: Nurse Practitioner | Admitting: Nurse Practitioner

## 2021-01-28 DIAGNOSIS — C50412 Malignant neoplasm of upper-outer quadrant of left female breast: Secondary | ICD-10-CM

## 2021-01-28 DIAGNOSIS — E2839 Other primary ovarian failure: Secondary | ICD-10-CM

## 2021-01-31 ENCOUNTER — Telehealth: Payer: Self-pay

## 2021-01-31 NOTE — Telephone Encounter (Signed)
Pt called and reviewed bone density results per Cira Rue, NP. Pt made aware of results and to take calcium and vitamin D supplements daily, no further questions or concerns at this time.

## 2021-06-05 IMAGING — MG MM DIGITAL DIAGNOSTIC BILAT W/ TOMO W/ CAD
6 of 9 series · 6 of 25 positions shown · non-contrast
Comparison: 10/26/2017 and earlier

ACR Breast Density Category a: The breast tissue is almost entirely
fatty.

CLINICAL DATA: LEFT lumpectomy in 6691.

EXAM:
DIGITAL DIAGNOSTIC BILATERAL MAMMOGRAM WITH CAD AND TOMO

[L CC]
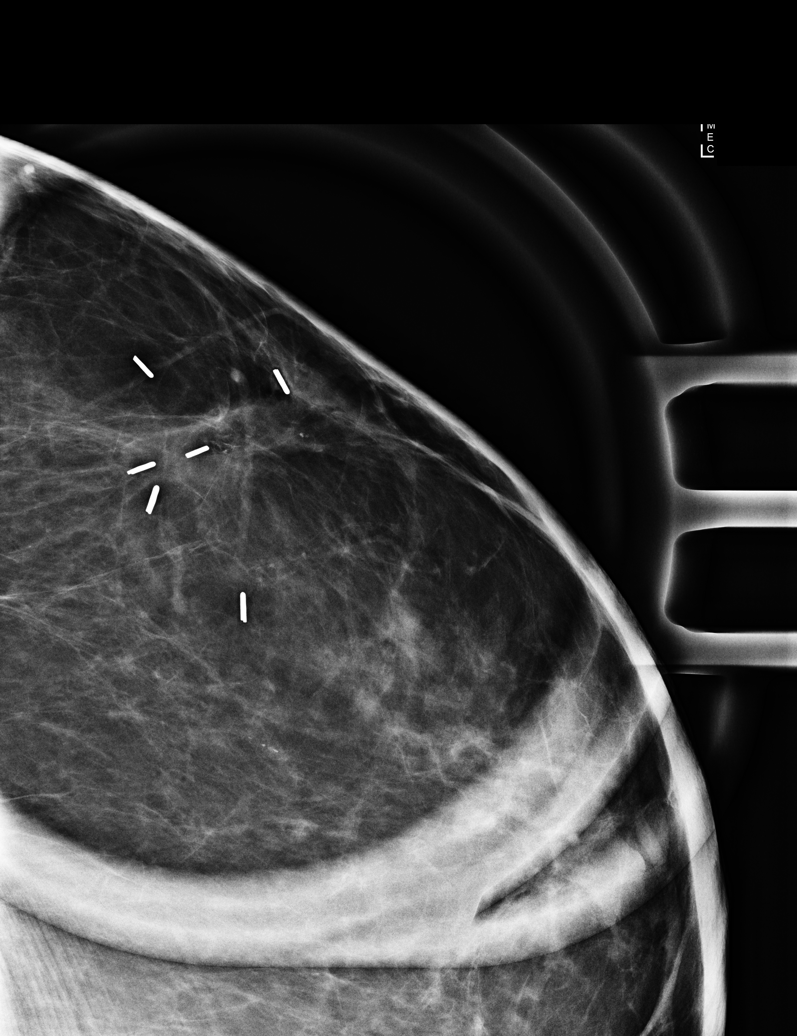

[R CC synth-2D]
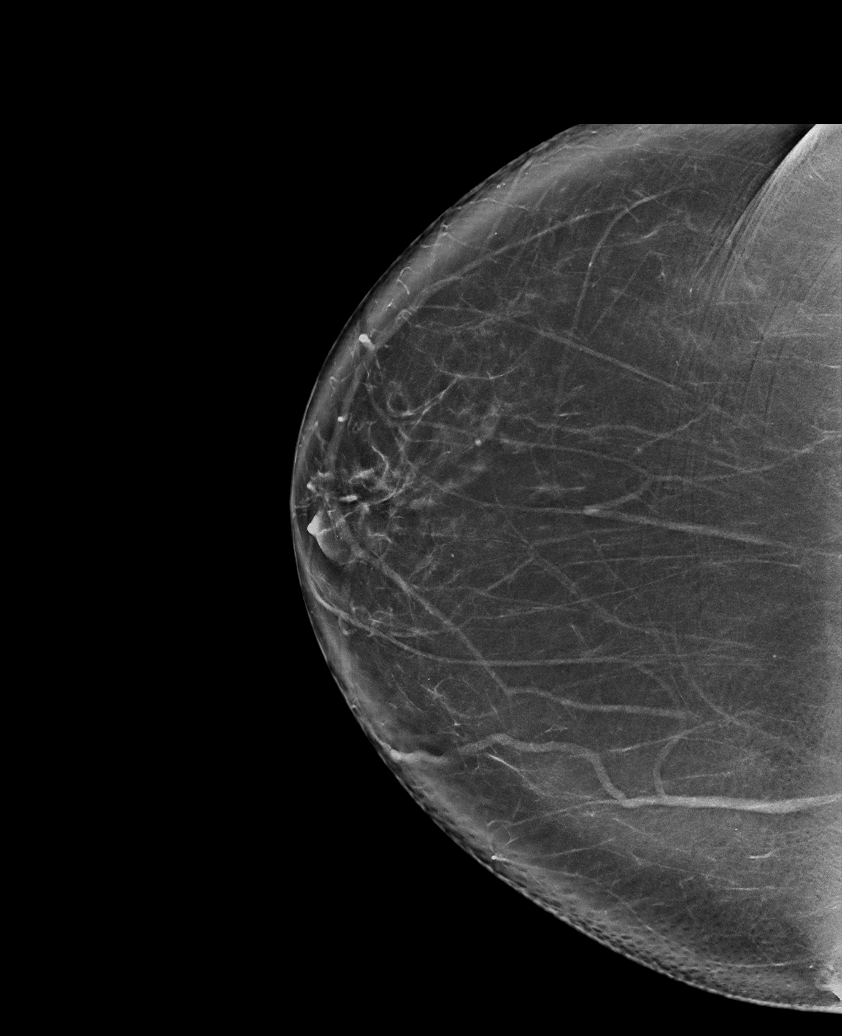

[L MLO synth-2D]
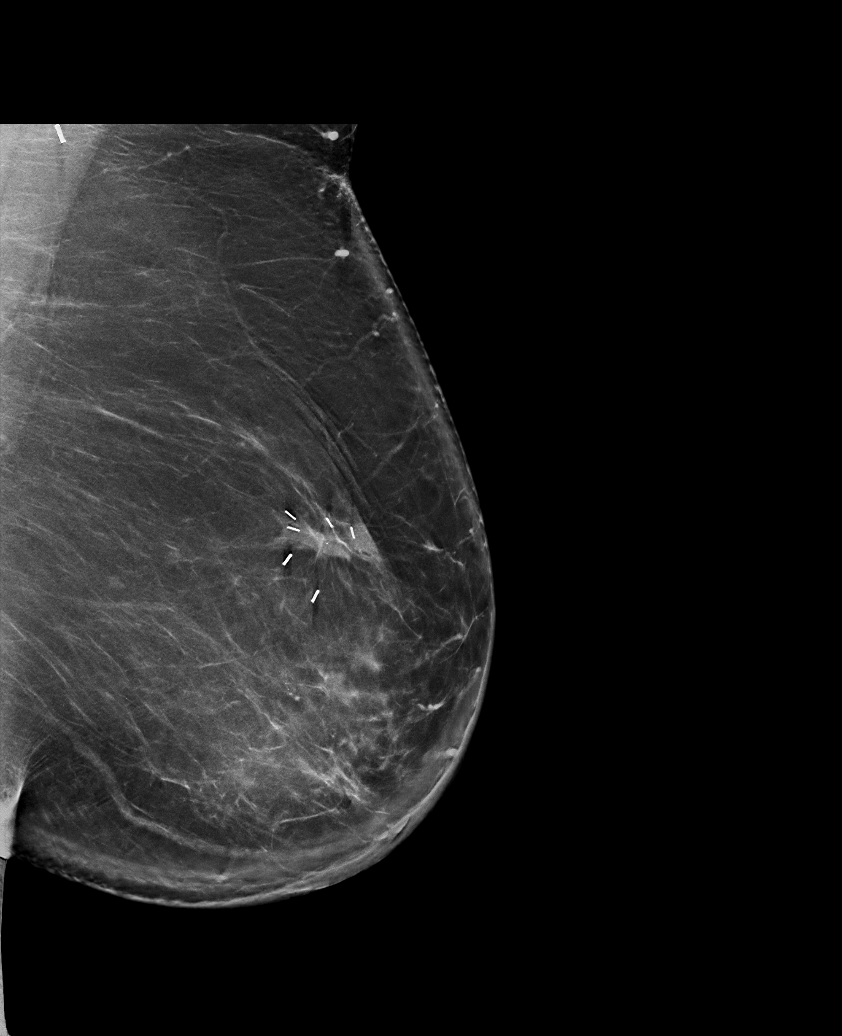

[R MLO synth-2D]
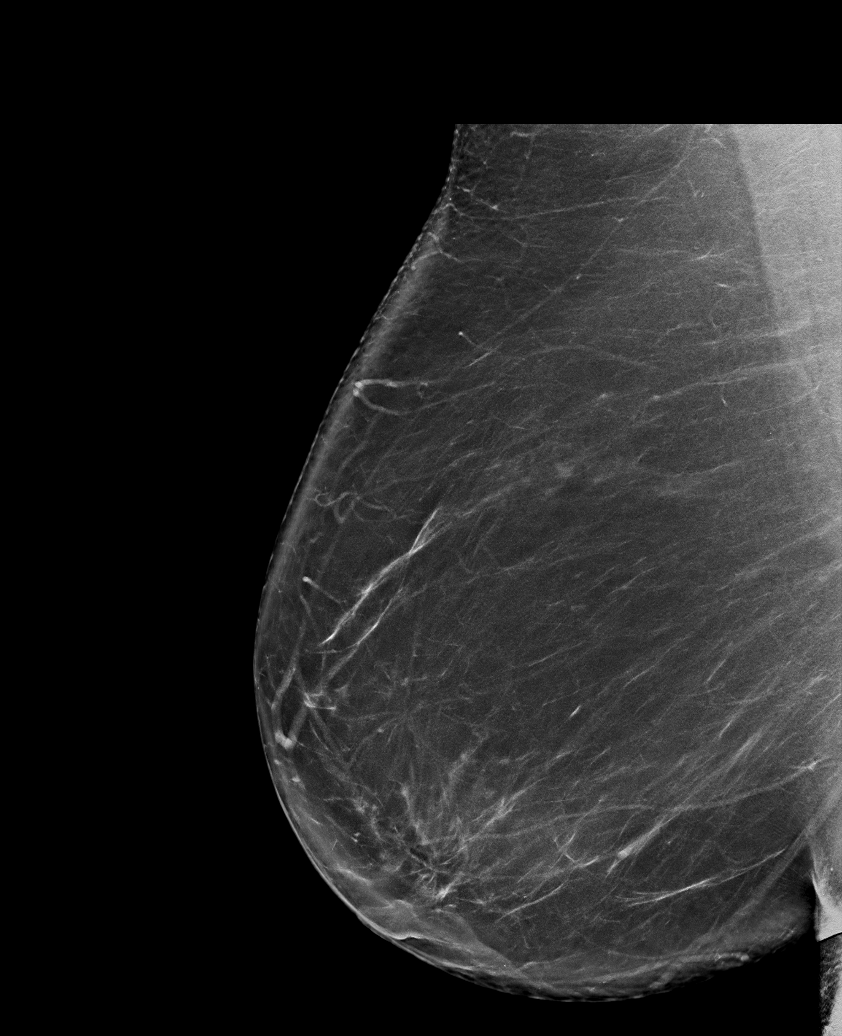

[L CC synth-2D]
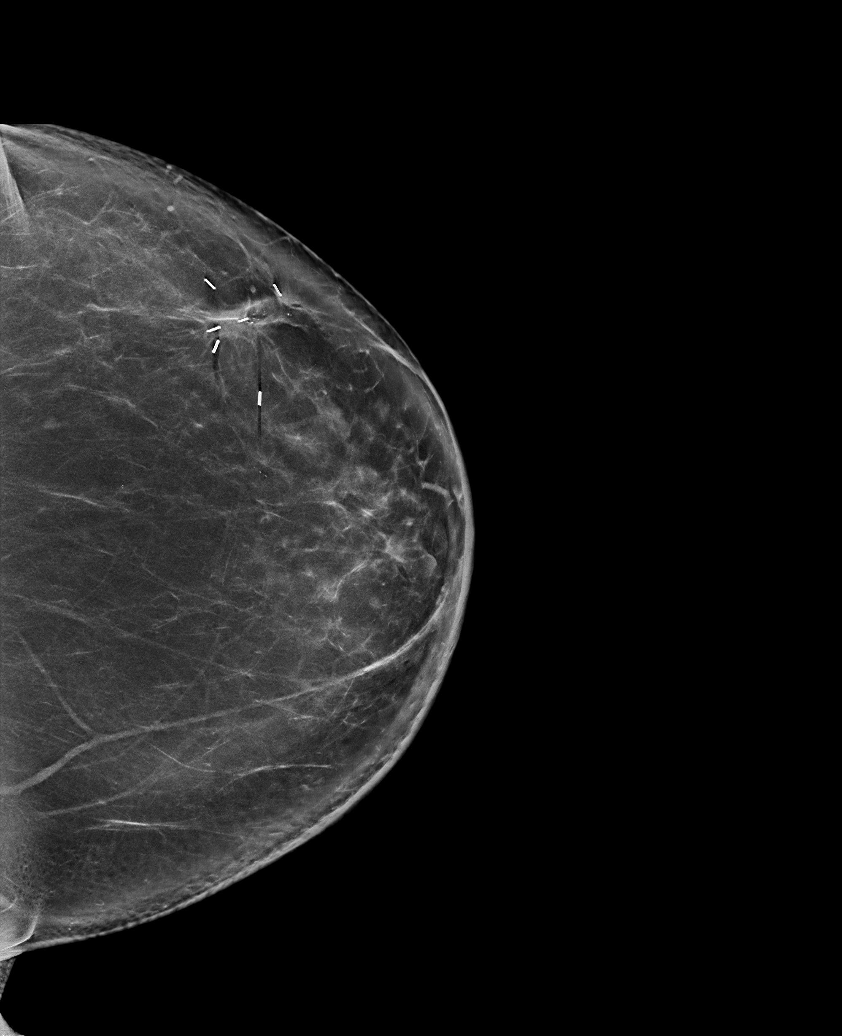

[L MLO tomo · tomo slice 54/107.0]
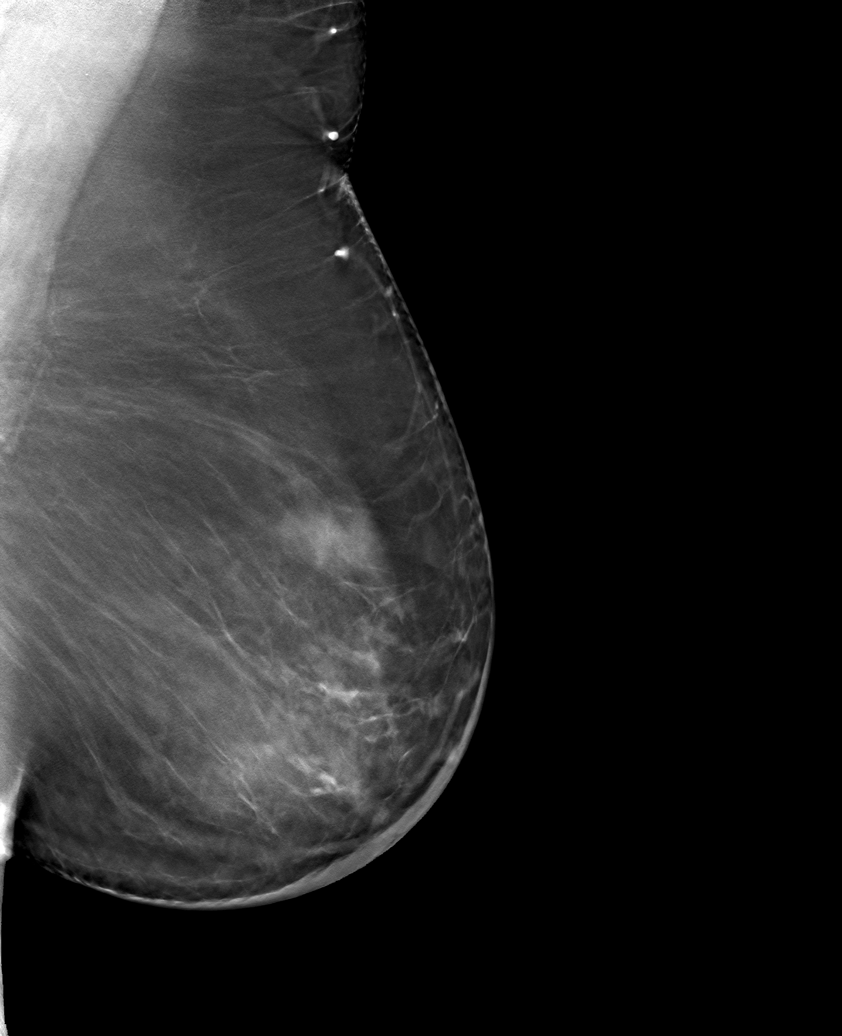

[6 of 25 positions shown; findings below may reference images not displayed]

FINDINGS: Post operative changes are seen in the LEFTbreast. No suspicious
mass, distortion, or microcalcifications are identified to suggest
presence of malignancy.

Mammographic images were processed with CAD.
IMPRESSION: No mammographic evidence for malignancy.

RECOMMENDATION:
Diagnostic mammogram is suggested in 1 year. (Code:KW-[id number])

I have discussed the findings and recommendations with the patient.
If applicable, a reminder letter will be sent to the patient
regarding the next appointment.

BI-RADS CATEGORY  2: Benign.

## 2021-07-04 ENCOUNTER — Encounter: Payer: Self-pay | Admitting: Hematology

## 2021-07-10 NOTE — Progress Notes (Signed)
?Nicole Schmitt   ?Telephone:(336) [phone] [phone]   ?Clinic Follow up Note  ? ?Patient Care Team: ?Andres Shad, MD as PCP - General (Family Medicine) ?[name], MD as Consulting Physician (Hematology) ?Tyler Pita, MD as Consulting Physician (Radiation Oncology) ?Nicole Seltzer, MD (Inactive) as Consulting Physician (General Surgery) ?Gardenia Phlegm, NP as Nurse Practitioner (Hematology and Oncology) ?07/11/2021 ? ?CHIEF COMPLAINT: Follow up left breast cancer  ? ?SUMMARY OF ONCOLOGIC HISTORY: ?Oncology History Overview Note  ?Breast cancer of upper-outer quadrant of left female breast (Nicole Schmitt) ?  Staging form: Breast, AJCC 7th Edition ?  - Pathologic: Stage IIA (T2, N0, cM0) - Unsigned ? ? ?  ?Breast cancer of upper-outer quadrant of left female breast (Nicole Schmitt)  ?10/20/2015 Mammogram  ? Screening mammogram showed new nodular density on the left breast, measuring about 2 cm. the well defined nodules bilaterally are otherwise stable. ? ?  ?10/27/2015 Initial Biopsy  ? Left breast 2:00 position mass biopsy showed infiltrating ductal carcinoma.  ? ?  ?10/27/2015 Initial Diagnosis  ? Breast cancer of upper-outer quadrant of left female breast (Nicole Schmitt) ? ?  ?10/27/2015 Receptors her2  ? ER 100% positive, strong staining, PR 90% positive, strong staining, HER-2 IHC 2+, FISH negative. ? ?  ?11/16/2015 Surgery  ? Left breast lumpectomy and sentinel lymph node biopsy. ? ?  ?11/16/2015 Pathology Results  ? Left breast lumpectomy showed invasive grade 3 ductal carcinoma, 2.2 cm, intermediate grade DCIS. Margins were negative. One sentinel lymph node was negative. Lymphovascular invasion was negative. ? ?  ?11/16/2015 Oncotype testing  ? RS 23, intermediate risk, which predicts 10 year risk of distant recurrence 15% with tamoxifen ? ?  ?12/23/2015 - 03/01/2016 Adjuvant Chemotherapy  ? Docetaxel and Cytoxan, every 3 weeks, with Neulasta on day 2, for 4 cycles ? ?  ?03/27/2016 - 05/10/2016  Radiation Therapy  ? Adjuvant breast radiation 03/27/16 - 05/10/16 ?60.4 Gy in 33 fractions to the left breast by Dr. Tammi Klippel. ? ?  ?05/31/2016 -  Anti-estrogen oral therapy  ? Letrozole 2.5 mg daily  ? ?  ? ? ?CURRENT THERAPY: Letrozole daily, starting 05/31/16 ? ?INTERVAL HISTORY: Nicole Schmitt returns for follow up as scheduled, last seen by me 01/10/21. She continues letrozole.  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  She denies new or worsening joint pain.  She has hot flashes that have gotten worse, mainly at night.  She has mild residual neuropathy more in her hands than feet, not limiting function.  Her hands are stiff but this has improved since she initially started letrozole.  Denies concerns in her breast such as new lump/mass, nipple discharge or inversion, or skin change.  Denies new cough, chest pain, dyspnea, change in bowel habits, bleeding, or any other new complaints.  Mammogram due in September.  ? ? ?MEDICAL HISTORY:  ?Past Medical History:  ?Diagnosis Date  ? Breast cancer ([place]) 2017  ? Left Breast Cancer  ? Cancer [facility]) 1990  ? cervical   ? Constipation   ? Heartburn   ? Hypertension   ? Joint pain   ? Palpitations   ? Personal history of chemotherapy 2017  ? Left Breast Cancer  ? Personal history of radiation therapy 2017  ? Left Breast Cancer  ? Seizures (HCC)   ? > 20 years following fall from horse  ? SOB (shortness of breath)   ? Swelling of both lower extremities   ?  Vertigo   ? ? ?SURGICAL HISTORY: ?Past Surgical History:  ?Procedure Laterality Date  ? BREAST LUMPECTOMY Left 11/16/2015  ? Malignant  ? BREAST LUMPECTOMY WITH RADIOACTIVE SEED AND SENTINEL LYMPH NODE BIOPSY Left 11/16/2015  ? Procedure: BREAST LUMPECTOMY WITH RADIOACTIVE SEED AND SENTINEL LYMPH NODE BIOPSY;  Surgeon: Nicole Seltzer, MD;  Location: [facility];  Service: General;  Laterality: Left;  ? CESAREAN SECTION  03/2001  ? COLONOSCOPY    ? WISDOM  TOOTH EXTRACTION    ? ? ?I have reviewed the social history and family history with the patient and they are unchanged from previous note. ? ?ALLERGIES:  has No Known Allergies. ? ?MEDICATIONS:  ?Current Outpatient Medications  ?Medication Sig Dispense Refill  ? letrozole (FEMARA) 2.5 MG tablet Take 1 tablet (2.5 mg total) by mouth daily. 90 tablet 3  ? losartan (COZAAR) 50 MG tablet Take 50 mg by mouth daily.    ? Acetaminophen (TYLENOL PO) Take by mouth.    ? hydrochlorothiazide (HYDRODIURIL) 25 MG tablet Take 25 mg by mouth daily. 12.5 mg to 25 mg dependent on bp medication    ? IBUPROFEN PO Take by mouth.    ? tretinoin (RETIN-A) 0.025 % cream Apply topically daily.    ? ?No current facility-administered medications for this visit.  ? ? ?PHYSICAL EXAMINATION: ?ECOG PERFORMANCE STATUS: 1 - Symptomatic but completely ambulatory ? ?Vitals:  ? 07/11/21 0913  ?BP: 122/68  ?Pulse: 63  ?Resp: 15  ?Temp: 97.7 ?F (36.5 ?C)  ?SpO2: 100%  ? ?Filed Weights  ? 07/11/21 0913  ?Weight: 263 lb 9.6 oz (119.6 kg)  ? ? ?GENERAL:alert, no distress and comfortable ?SKIN: No rash ?EYES: sclera clear ?NECK: Without mass ?LYMPH:  no palpable apical or supraclavicular lymphadenopathy  ?LUNGS: clear with normal breathing effort ?HEART: regular rate & rhythm, no lower extremity edema ?ABDOMEN:abdomen soft, non-tender and normal bowel sounds ?Musculoskeletal: No focal spinal tenderness ?NEURO: alert & oriented x 3 with fluent speech, no focal motor deficits ?Breast exam : Breasts are symmetrical without nipple discharge or inversion.  S/p left lumpectomy and radiation, incisions completely healed.  Mild scar tissue at the left lateral breast.  No palpable mass or nodularity in either breast or axilla that I could appreciate.  ? ?LABORATORY DATA:  ?I have reviewed the data as listed ? ?  Latest Ref Rng & Units 07/11/2021  ?  8:40 AM 01/10/2021  ?  9:52 AM 07/01/2020  ?  8:36 AM  ?CBC  ?WBC 4.0 - 10.5 K/uL 9.5   8.8   5.8    ?Hemoglobin 12.0 -  15.0 g/dL 13.9   13.7   12.9    ?Hematocrit 36.0 - 46.0 % 41.5   41.7   39.4    ?Platelets 150 - 400 K/uL 314   296   245    ? ? ? ? ?  Latest Ref Rng & Units 07/11/2021  ?  8:40 AM 01/10/2021  ?  9:52 AM 07/01/2020  ?  8:36 AM  ?CMP  ?Glucose 70 - 99 mg/dL 99   94   97    ?BUN 6 - 20 mg/dL '13   20   15    ' ?Creatinine 0.44 - 1.00 mg/dL 0.69   0.72   0.73    ?Sodium 135 - 145 mmol/L 140   139   140    ?Potassium 3.5 - 5.1 mmol/L 4.0   4.0   3.8    ?Chloride 98 -  111 mmol/L 104   105   106    ?CO2 22 - 32 mmol/L '29   24   26    ' ?Calcium 8.9 - 10.3 mg/dL 10.1   9.6   9.3    ?Total Protein 6.5 - 8.1 g/dL 7.5   7.5   7.1    ?Total Bilirubin 0.3 - 1.2 mg/dL 0.4   0.5   0.5    ?Alkaline Phos 38 - 126 U/L 88   106   99    ?AST 15 - 41 U/L '13   15   16    ' ?ALT 0 - 44 U/L '13   14   12    ' ? ? ? ? ?RADIOGRAPHIC STUDIES: ?I have personally reviewed the radiological images as listed and agreed with the findings in the report. ?No results found.  ? ?ASSESSMENT & PLAN: BRANDIS WIXTED is a 61 y.o. female with  ?  ?  ?1. 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 Plan for 7-10 years if she tolerates   ?-Mammogram in 10/2020 was negative ?-Ms. Slater is clinically doing well.  Tolerating letrozole moderately well with hot flashes and left knee pain.  She plans to see Ortho for this soon.  We reviewed symptom management, she prefers to continue AI and monitor for now.  ?-Physical exam is benign, labs unremarkable; overall there is no clinical concern for breast cancer recurrence.  She has completed 5 years of surveillance, the recurrence risk has significantly decreased.   ?-Continue AI and surveillance, next visit in 1 year. ?  ?2. Peripheral neuropathy, grade 1, Secondary to adjuvant chemotherapy ?-feet > fingers previously, now more in fingers ?-improved over time, manageabl

## 2021-07-11 ENCOUNTER — Encounter: Payer: Self-pay | Admitting: Hematology

## 2021-07-11 ENCOUNTER — Encounter: Payer: Self-pay | Admitting: Nurse Practitioner

## 2021-07-11 ENCOUNTER — Inpatient Hospital Stay: Payer: BC Managed Care – PPO | Attending: Nurse Practitioner

## 2021-07-11 ENCOUNTER — Other Ambulatory Visit: Payer: Self-pay

## 2021-07-11 ENCOUNTER — Inpatient Hospital Stay (HOSPITAL_BASED_OUTPATIENT_CLINIC_OR_DEPARTMENT_OTHER): Payer: BC Managed Care – PPO | Admitting: Nurse Practitioner

## 2021-07-11 VITALS — BP 122/68 | HR 63 | Temp 97.7°F | Resp 15 | Wt 263.6 lb

## 2021-07-11 DIAGNOSIS — Z9221 Personal history of antineoplastic chemotherapy: Secondary | ICD-10-CM | POA: Insufficient documentation

## 2021-07-11 DIAGNOSIS — C50412 Malignant neoplasm of upper-outer quadrant of left female breast: Secondary | ICD-10-CM | POA: Insufficient documentation

## 2021-07-11 DIAGNOSIS — Z923 Personal history of irradiation: Secondary | ICD-10-CM | POA: Diagnosis not present

## 2021-07-11 DIAGNOSIS — Z1211 Encounter for screening for malignant neoplasm of colon: Secondary | ICD-10-CM | POA: Diagnosis not present

## 2021-07-11 DIAGNOSIS — Z170 Estrogen receptor positive status [ER+]: Secondary | ICD-10-CM

## 2021-07-11 DIAGNOSIS — Z79811 Long term (current) use of aromatase inhibitors: Secondary | ICD-10-CM | POA: Insufficient documentation

## 2021-07-11 LAB — CBC WITH DIFFERENTIAL (CANCER CENTER ONLY)
Abs Immature Granulocytes: 0.03 10*3/uL (ref 0.00–0.07)
Basophils Absolute: 0 10*3/uL (ref 0.0–0.1)
Basophils Relative: 0 %
Eosinophils Absolute: 0.1 10*3/uL (ref 0.0–0.5)
Eosinophils Relative: 1 %
HCT: 41.5 % (ref 36.0–46.0)
Hemoglobin: 13.9 g/dL (ref 12.0–15.0)
Immature Granulocytes: 0 %
Lymphocytes Relative: 34 %
Lymphs Abs: 3.2 10*3/uL (ref 0.7–4.0)
MCH: 29.4 pg (ref 26.0–34.0)
MCHC: 33.5 g/dL (ref 30.0–36.0)
MCV: 87.9 fL (ref 80.0–100.0)
Monocytes Absolute: 0.7 10*3/uL (ref 0.1–1.0)
Monocytes Relative: 7 %
Neutro Abs: 5.4 10*3/uL (ref 1.7–7.7)
Neutrophils Relative %: 58 %
Platelet Count: 314 10*3/uL (ref 150–400)
RBC: 4.72 MIL/uL (ref 3.87–5.11)
RDW: 13.2 % (ref 11.5–15.5)
WBC Count: 9.5 10*3/uL (ref 4.0–10.5)
nRBC: 0 % (ref 0.0–0.2)

## 2021-07-11 LAB — CMP (CANCER CENTER ONLY)
ALT: 13 U/L (ref 0–44)
AST: 13 U/L — ABNORMAL LOW (ref 15–41)
Albumin: 4.1 g/dL (ref 3.5–5.0)
Alkaline Phosphatase: 88 U/L (ref 38–126)
Anion gap: 7 (ref 5–15)
BUN: 13 mg/dL (ref 6–20)
CO2: 29 mmol/L (ref 22–32)
Calcium: 10.1 mg/dL (ref 8.9–10.3)
Chloride: 104 mmol/L (ref 98–111)
Creatinine: 0.69 mg/dL (ref 0.44–1.00)
GFR, Estimated: >60 mL/min (ref >60)
Glucose, Bld: 99 mg/dL (ref 70–99)
Potassium: 4 mmol/L (ref 3.5–5.1)
Sodium: 140 mmol/L (ref 135–145)
Total Bilirubin: 0.4 mg/dL (ref 0.3–1.2)
Total Protein: 7.5 g/dL (ref 6.5–8.1)

## 2021-07-13 ENCOUNTER — Telehealth: Payer: Self-pay | Admitting: Hematology

## 2021-07-13 NOTE — Telephone Encounter (Signed)
Scheduled follow-up appointment per 5/15 los. Patient is aware. ?

## 2021-07-14 ENCOUNTER — Encounter: Payer: Self-pay | Admitting: Physician Assistant

## 2021-08-04 ENCOUNTER — Ambulatory Visit: Payer: BC Managed Care – PPO | Admitting: Physician Assistant

## 2021-08-16 ENCOUNTER — Ambulatory Visit: Payer: BC Managed Care – PPO | Admitting: Physician Assistant

## 2021-09-22 ENCOUNTER — Other Ambulatory Visit: Payer: Self-pay | Admitting: Nurse Practitioner

## 2021-09-22 ENCOUNTER — Other Ambulatory Visit: Payer: Self-pay

## 2021-09-22 DIAGNOSIS — C50412 Malignant neoplasm of upper-outer quadrant of left female breast: Secondary | ICD-10-CM

## 2021-10-05 ENCOUNTER — Encounter (INDEPENDENT_AMBULATORY_CARE_PROVIDER_SITE_OTHER): Payer: Self-pay

## 2021-10-24 ENCOUNTER — Other Ambulatory Visit: Payer: Self-pay | Admitting: Orthopaedic Surgery

## 2021-10-24 DIAGNOSIS — M79672 Pain in left foot: Secondary | ICD-10-CM

## 2021-10-24 DIAGNOSIS — M25562 Pain in left knee: Secondary | ICD-10-CM

## 2021-11-08 ENCOUNTER — Ambulatory Visit
Admission: RE | Admit: 2021-11-08 | Discharge: 2021-11-08 | Disposition: A | Discharge status: Home / Self Care | Payer: BC Managed Care – PPO | Source: Ambulatory Visit | Attending: Orthopaedic Surgery | Admitting: Orthopaedic Surgery

## 2021-11-08 DIAGNOSIS — M79672 Pain in left foot: Secondary | ICD-10-CM

## 2021-11-08 DIAGNOSIS — M25562 Pain in left knee: Secondary | ICD-10-CM

## 2021-12-02 ENCOUNTER — Ambulatory Visit
Admission: RE | Admit: 2021-12-02 | Discharge: 2021-12-02 | Disposition: A | Discharge status: Home / Self Care | Payer: BC Managed Care – PPO | Source: Ambulatory Visit | Attending: Nurse Practitioner | Admitting: Nurse Practitioner

## 2021-12-02 DIAGNOSIS — Z170 Estrogen receptor positive status [ER+]: Secondary | ICD-10-CM

## 2022-06-18 ENCOUNTER — Other Ambulatory Visit: Payer: Self-pay | Admitting: Nurse Practitioner

## 2022-06-18 DIAGNOSIS — Z170 Estrogen receptor positive status [ER+]: Secondary | ICD-10-CM

## 2022-06-20 ENCOUNTER — Encounter: Payer: Self-pay | Admitting: Hematology

## 2022-07-11 ENCOUNTER — Other Ambulatory Visit: Payer: Self-pay

## 2022-07-11 DIAGNOSIS — Z170 Estrogen receptor positive status [ER+]: Secondary | ICD-10-CM

## 2022-07-11 NOTE — Progress Notes (Unsigned)
Patient Care Team: Nicole Robes, MD as PCP - General (Family Medicine) Nicole Mood, MD as Consulting Physician (Hematology) Nicole Dys, MD as Consulting Physician (Radiation Oncology) Nicole Fellows, MD (Inactive) as Consulting Physician (General Surgery) Nicole Schmitt, Nicole Schmitt, Nicole Schmitt as Nurse Practitioner (Hematology and Oncology)   CHIEF COMPLAINT: Follow up left breast cancer   Oncology History Overview Note  Breast cancer of upper-outer quadrant of left female breast [facility])   Staging form: Breast, AJCC 7th Edition   - Pathologic: Stage IIA (T2, N0, cM0) - Unsigned    Breast cancer of upper-outer quadrant of left female breast (HCC)  10/20/2015 Mammogram   Screening mammogram showed new nodular density on the left breast, measuring about 2 cm. the well defined nodules bilaterally are otherwise stable.   10/27/2015 Initial Biopsy   Left breast 2:00 position mass biopsy showed infiltrating ductal carcinoma.    10/27/2015 Initial Diagnosis   Breast cancer of upper-outer quadrant of left female breast (HCC)   10/27/2015 Receptors her2   ER 100% positive, strong staining, PR 90% positive, strong staining, HER-2 IHC 2+, FISH negative.   11/16/2015 Surgery   Left breast lumpectomy and sentinel lymph node biopsy.   11/16/2015 Pathology Results   Left breast lumpectomy showed invasive grade 3 ductal carcinoma, 2.2 cm, intermediate grade DCIS. Margins were negative. One sentinel lymph node was negative. Lymphovascular invasion was negative.   11/16/2015 Oncotype testing   RS 23, intermediate risk, which predicts 10 year risk of distant recurrence 15% with tamoxifen   12/23/2015 - 03/01/2016 Adjuvant Chemotherapy   Docetaxel and Cytoxan, every 3 weeks, with Neulasta on day 2, for 4 cycles   03/27/2016 - 05/10/2016 Radiation Therapy   Adjuvant breast radiation 03/27/16 - 05/10/16 60.4 Gy in 33 fractions to the left breast by Dr. Kathrynn Running.   05/31/2016 -  Anti-estrogen oral  therapy   Letrozole 2.5 mg daily       CURRENT THERAPY Letrozole daily, starting 05/31/16   INTERVAL HISTORY Nicole Schmitt returns for follow up as scheduled. Last seen by me 07/11/21. Mammogram 12/02/21 was negative. She continues letrozole  ROS   Past Medical History:  Diagnosis Date   Breast cancer (HCC) 2017   Left Breast Cancer   Cancer [facility]) 1990   cervical    Constipation    Heartburn    Hypertension    Joint pain    Palpitations    Personal history of chemotherapy 2017   Left Breast Cancer   Personal history of radiation therapy 2017   Left Breast Cancer   Seizures (HCC)    > 20 years following fall from horse   SOB (shortness of breath)    Swelling of both lower extremities    Vertigo      Past Surgical History:  Procedure Laterality Date   BREAST LUMPECTOMY Left 11/16/2015   Malignant   BREAST LUMPECTOMY WITH RADIOACTIVE SEED AND SENTINEL LYMPH NODE BIOPSY Left 11/16/2015   Procedure: BREAST LUMPECTOMY WITH RADIOACTIVE SEED AND SENTINEL LYMPH NODE BIOPSY;  Surgeon: Nicole Fellows, MD;  Location: [facility];  Service: General;  Laterality: Left;   CESAREAN SECTION  03/2001   COLONOSCOPY     WISDOM TOOTH EXTRACTION       Outpatient Encounter Medications as of 07/12/2022  Medication Sig Note   Acetaminophen (TYLENOL PO) Take by mouth.    hydrochlorothiazide (HYDRODIURIL) 25 MG tablet Take 25 mg by mouth daily. 12.5 mg to 25 mg dependent on bp medication 11/23/2015: Leanora Ivanoff  prn & usually 1/2 tab   IBUPROFEN PO Take by mouth.    letrozole (FEMARA) 2.5 MG tablet TAKE 1 TABLET BY MOUTH EVERY DAY    losartan (COZAAR) 50 MG tablet Take 50 mg by mouth daily.    tretinoin (RETIN-A) 0.025 % cream Apply topically daily.    No facility-administered encounter medications on file as of 07/12/2022.     There were no vitals filed for this visit. There is no height or weight on file to calculate BMI.   PHYSICAL EXAM GENERAL:alert, no distress and  comfortable SKIN: no rash  EYES: sclera clear NECK: without mass LYMPH:  no palpable cervical or supraclavicular lymphadenopathy  LUNGS: clear with normal breathing effort HEART: regular rate & rhythm, no lower extremity edema ABDOMEN: abdomen soft, non-tender and normal bowel sounds NEURO: alert & oriented x 3 with fluent speech, no focal motor/sensory deficits Breast exam:  PAC without erythema    CBC    Component Value Date/Time   WBC 9.5 07/11/2021 0840   WBC 5.8 07/01/2020 0836   RBC 4.72 07/11/2021 0840   HGB 13.9 07/11/2021 0840   HGB 13.6 08/04/2019 1147   HGB 13.5 10/26/2016 0817   HCT 41.5 07/11/2021 0840   HCT 42.7 08/04/2019 1147   HCT 40.3 10/26/2016 0817   PLT 314 07/11/2021 0840   PLT 305 08/04/2019 1147   MCV 87.9 07/11/2021 0840   MCV 89 08/04/2019 1147   MCV 87.1 10/26/2016 0817   MCH 29.4 07/11/2021 0840   MCHC 33.5 07/11/2021 0840   RDW 13.2 07/11/2021 0840   RDW 13.2 08/04/2019 1147   RDW 13.9 10/26/2016 0817   LYMPHSABS 3.2 07/11/2021 0840   LYMPHSABS 2.5 08/04/2019 1147   LYMPHSABS 2.1 10/26/2016 0817   MONOABS 0.7 07/11/2021 0840   MONOABS 0.5 10/26/2016 0817   EOSABS 0.1 07/11/2021 0840   EOSABS 0.1 08/04/2019 1147   BASOSABS 0.0 07/11/2021 0840   BASOSABS 0.0 08/04/2019 1147   BASOSABS 0.1 10/26/2016 0817     CMP     Component Value Date/Time   NA 140 07/11/2021 0840   NA 143 08/04/2019 1147   NA 142 10/26/2016 0817   K 4.0 07/11/2021 0840   K 4.3 10/26/2016 0817   CL 104 07/11/2021 0840   CO2 29 07/11/2021 0840   CO2 25 10/26/2016 0817   GLUCOSE 99 07/11/2021 0840   GLUCOSE 95 10/26/2016 0817   BUN 13 07/11/2021 0840   BUN 14 08/04/2019 1147   BUN 14.9 10/26/2016 0817   CREATININE 0.69 07/11/2021 0840   CREATININE 0.7 10/26/2016 0817   CALCIUM 10.1 07/11/2021 0840   CALCIUM 9.9 10/26/2016 0817   PROT 7.5 07/11/2021 0840   PROT 7.1 08/04/2019 1147   PROT 7.0 10/26/2016 0817   ALBUMIN 4.1 07/11/2021 0840   ALBUMIN 4.3  08/04/2019 1147   ALBUMIN 3.5 10/26/2016 0817   AST 13 (L) 07/11/2021 0840   AST 15 10/26/2016 0817   ALT 13 07/11/2021 0840   ALT 15 10/26/2016 0817   ALKPHOS 88 07/11/2021 0840   ALKPHOS 113 10/26/2016 0817   BILITOT 0.4 07/11/2021 0840   BILITOT 0.44 10/26/2016 0817   GFRNONAA >60 07/11/2021 0840   GFRAA 115 08/04/2019 1147     ASSESSMENT & PLAN:  PLAN:  No orders of the defined types were placed in this encounter.     All questions were answered. The patient knows to call the clinic with any problems, questions or concerns. No barriers to learning  were detected. I spent *** counseling the patient face to face. The total time spent in the appointment was *** and more than 50% was on counseling, review of test results, and coordination of care.   Santiago Glad, Nicole Schmitt-C @DATE @

## 2022-07-12 ENCOUNTER — Inpatient Hospital Stay: Payer: BC Managed Care – PPO | Attending: Nurse Practitioner | Admitting: Nurse Practitioner

## 2022-07-12 ENCOUNTER — Other Ambulatory Visit: Payer: Self-pay

## 2022-07-12 ENCOUNTER — Inpatient Hospital Stay: Payer: BC Managed Care – PPO

## 2022-07-12 ENCOUNTER — Encounter: Payer: Self-pay | Admitting: Nurse Practitioner

## 2022-07-12 VITALS — BP 134/65 | HR 72 | Temp 98.5°F | Resp 17 | Wt 266.0 lb

## 2022-07-12 DIAGNOSIS — C50412 Malignant neoplasm of upper-outer quadrant of left female breast: Secondary | ICD-10-CM | POA: Diagnosis not present

## 2022-07-12 DIAGNOSIS — Z9221 Personal history of antineoplastic chemotherapy: Secondary | ICD-10-CM | POA: Insufficient documentation

## 2022-07-12 DIAGNOSIS — Z923 Personal history of irradiation: Secondary | ICD-10-CM | POA: Diagnosis not present

## 2022-07-12 DIAGNOSIS — Z79811 Long term (current) use of aromatase inhibitors: Secondary | ICD-10-CM | POA: Diagnosis not present

## 2022-07-12 DIAGNOSIS — Z170 Estrogen receptor positive status [ER+]: Secondary | ICD-10-CM | POA: Diagnosis not present

## 2022-07-12 DIAGNOSIS — G4700 Insomnia, unspecified: Secondary | ICD-10-CM | POA: Diagnosis not present

## 2022-07-12 DIAGNOSIS — Z79899 Other long term (current) drug therapy: Secondary | ICD-10-CM | POA: Insufficient documentation

## 2022-07-12 DIAGNOSIS — M8580 Other specified disorders of bone density and structure, unspecified site: Secondary | ICD-10-CM | POA: Diagnosis not present

## 2022-07-12 LAB — CMP (CANCER CENTER ONLY)
ALT: 11 U/L (ref 0–44)
AST: 13 U/L — ABNORMAL LOW (ref 15–41)
Albumin: 4.1 g/dL (ref 3.5–5.0)
Alkaline Phosphatase: 95 U/L (ref 38–126)
Anion gap: 7 (ref 5–15)
BUN: 18 mg/dL (ref 6–20)
CO2: 26 mmol/L (ref 22–32)
Calcium: 9.3 mg/dL (ref 8.9–10.3)
Chloride: 108 mmol/L (ref 98–111)
Creatinine: 0.69 mg/dL (ref 0.44–1.00)
GFR, Estimated: >60 mL/min (ref >60)
Glucose, Bld: 98 mg/dL (ref 70–99)
Potassium: 4 mmol/L (ref 3.5–5.1)
Sodium: 141 mmol/L (ref 135–145)
Total Bilirubin: 0.6 mg/dL (ref 0.3–1.2)
Total Protein: 7.2 g/dL (ref 6.5–8.1)

## 2022-07-12 LAB — CBC WITH DIFFERENTIAL (CANCER CENTER ONLY)
Abs Immature Granulocytes: 0.02 10*3/uL (ref 0.00–0.07)
Basophils Absolute: 0 10*3/uL (ref 0.0–0.1)
Basophils Relative: 1 %
Eosinophils Absolute: 0.1 10*3/uL (ref 0.0–0.5)
Eosinophils Relative: 1 %
HCT: 42.8 % (ref 36.0–46.0)
Hemoglobin: 14.1 g/dL (ref 12.0–15.0)
Immature Granulocytes: 0 %
Lymphocytes Relative: 32 %
Lymphs Abs: 2.7 10*3/uL (ref 0.7–4.0)
MCH: 29.2 pg (ref 26.0–34.0)
MCHC: 32.9 g/dL (ref 30.0–36.0)
MCV: 88.6 fL (ref 80.0–100.0)
Monocytes Absolute: 0.6 10*3/uL (ref 0.1–1.0)
Monocytes Relative: 7 %
Neutro Abs: 5 10*3/uL (ref 1.7–7.7)
Neutrophils Relative %: 59 %
Platelet Count: 289 10*3/uL (ref 150–400)
RBC: 4.83 MIL/uL (ref 3.87–5.11)
RDW: 13.3 % (ref 11.5–15.5)
WBC Count: 8.4 10*3/uL (ref 4.0–10.5)
nRBC: 0 % (ref 0.0–0.2)

## 2022-11-21 ENCOUNTER — Encounter: Payer: Self-pay | Admitting: Hematology

## 2022-11-21 ENCOUNTER — Other Ambulatory Visit (HOSPITAL_COMMUNITY): Payer: Self-pay

## 2022-11-21 MED ORDER — LETROZOLE 2.5 MG PO TABS
2.5000 mg | ORAL_TABLET | Freq: Every day | ORAL | 1 refills | Status: DC
  Filled 2022-11-21: qty 90, 90d supply, fill #0
  Filled 2023-03-24: qty 90, 90d supply, fill #1

## 2022-11-27 ENCOUNTER — Encounter: Payer: Self-pay | Admitting: Hematology

## 2022-12-04 ENCOUNTER — Ambulatory Visit
Admission: RE | Admit: 2022-12-04 | Discharge: 2022-12-04 | Disposition: A | Discharge status: Home / Self Care | Payer: Self-pay | Source: Ambulatory Visit | Attending: Nurse Practitioner | Admitting: Nurse Practitioner

## 2022-12-04 ENCOUNTER — Encounter: Payer: Self-pay | Admitting: Hematology

## 2022-12-04 ENCOUNTER — Ambulatory Visit: Payer: BC Managed Care – PPO

## 2022-12-04 DIAGNOSIS — Z1231 Encounter for screening mammogram for malignant neoplasm of breast: Secondary | ICD-10-CM | POA: Diagnosis not present

## 2022-12-04 DIAGNOSIS — Z170 Estrogen receptor positive status [ER+]: Secondary | ICD-10-CM

## 2022-12-27 ENCOUNTER — Ambulatory Visit: Payer: BC Managed Care – PPO | Admitting: Family

## 2023-01-31 ENCOUNTER — Ambulatory Visit: Payer: BC Managed Care – PPO | Admitting: Internal Medicine

## 2023-03-06 ENCOUNTER — Telehealth: Payer: Self-pay | Admitting: *Deleted

## 2023-03-06 ENCOUNTER — Other Ambulatory Visit: Payer: Self-pay | Admitting: Nurse Practitioner

## 2023-03-06 ENCOUNTER — Encounter: Payer: Self-pay | Admitting: Internal Medicine

## 2023-03-06 ENCOUNTER — Ambulatory Visit: Payer: Commercial Managed Care - PPO | Attending: Internal Medicine | Admitting: Internal Medicine

## 2023-03-06 VITALS — BP 108/80 | HR 68 | Resp 16 | Ht 65.0 in | Wt 267.0 lb

## 2023-03-06 DIAGNOSIS — I10 Essential (primary) hypertension: Secondary | ICD-10-CM | POA: Diagnosis not present

## 2023-03-06 NOTE — Telephone Encounter (Signed)
 Patient agreement reviewed and signed on 03/06/2023.  WatchPAT issued to patient on 03/06/2023 by Delon Medley, CMA. Patient aware to not open the WatchPAT box until contacted with the activation PIN. Patient profile initialized in CloudPAT on 03/06/2023 by Burnard Cha, RN. Device serial number: [id number]  Please list Reason for Call as Advice Only and type WatchPAT issued to patient in the comment box.

## 2023-03-06 NOTE — Patient Instructions (Addendum)
 Medication Instructions:   *If you need a refill on your cardiac medications before your next appointment, please call your pharmacy*   Lab Work: BMET, CBC, TSH, NMR, APO B, LIPO A, HGBA1C  If you have labs (blood work) drawn today and your tests are completely normal, you will receive your results only by: MyChart Message (if you have MyChart) OR A paper copy in the mail If you have any lab test that is abnormal or we need to change your treatment, we will call you to review the results.   Testing/Procedures:  Your physician has requested that you have an echocardiogram. Echocardiography is a painless test that uses sound waves to create images of your heart. It provides your doctor with information about the size and shape of your heart and how well your heart's chambers and valves are working. This procedure takes approximately one hour. There are no restrictions for this procedure. Please do NOT wear cologne, perfume, aftershave, or lotions (deodorant is allowed). Please arrive 15 minutes prior to your appointment time.  Please note: We ask at that you not bring children with you during ultrasound (echo/ vascular) testing. Due to room size and safety concerns, children are not allowed in the ultrasound rooms during exams. Our front office staff cannot provide observation of children in our lobby area while testing is being conducted. An adult accompanying a patient to their appointment will only be allowed in the ultrasound room at the discretion of the ultrasound technician under special circumstances. We apologize for any inconvenience.   Follow-Up: At [facility], you and your health needs are our priority.  As part of our continuing mission to provide you with exceptional heart care, we have created designated Provider Care Teams.  These Care Teams include your primary Cardiologist (physician) and Advanced Practice Providers (APPs -  Physician Assistants and Nurse  Practitioners) who all work together to provide you with the care you need, when you need it.  We recommend signing up for the patient portal called MyChart.  Sign up information is provided on this After Visit Summary.  MyChart is used to connect with patients for Virtual Visits (Telemedicine).  Patients are able to view lab/test results, encounter notes, upcoming appointments, etc.  Non-urgent messages can be sent to your provider as well.   To learn more about what you can do with MyChart, go to [email or web address].    Your next appointment: JUNE 2025

## 2023-03-06 NOTE — Telephone Encounter (Signed)
 Itamar device given to pt, SN# [id number], ordered by Dr. Tenny Craw

## 2023-03-06 NOTE — Progress Notes (Signed)
 Cardiology Office Note   Date:  03/06/2023   ID:  Nicole Schmitt, DOB 1960/06/19, MRN [id number]  PCP:  Ann Mayme POUR, NP  Cardiologist:   Vina Gull, MD   Pt presents to establish care     History of Present Illness: Nicole Schmitt is a 63 y.o. female with a history of HTN Previously followed by  Dr Kotlaba.  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   IBUPROFEN PO Take by mouth.   letrozole  (FEMARA ) 2.5 MG tablet Take 1 tablet (2.5 mg total) by mouth daily.   losartan  (COZAAR ) 50 MG tablet Take 50 mg by mouth daily.   tretinoin  (RETIN-A ) 0.025 % cream Apply topically daily.     Allergies:   Patient has no known allergies.   Past Medical History:  Diagnosis Date   Breast cancer (HCC) 2017   Left Breast Cancer   Cancer [facility]) 1990   cervical    Constipation    Heartburn    Hypertension    Joint pain    Palpitations    Personal history of chemotherapy 2017   Left Breast Cancer   Personal history of radiation therapy 2017   Left Breast Cancer   Seizures (HCC)    > 20 years following fall from horse   SOB (shortness of breath)    Swelling of both lower extremities    Vertigo     Past Surgical History:  Procedure Laterality Date   BREAST LUMPECTOMY Left 11/16/2015   Malignant   BREAST LUMPECTOMY WITH RADIOACTIVE SEED AND SENTINEL LYMPH NODE BIOPSY Left 11/16/2015   Procedure: BREAST LUMPECTOMY WITH RADIOACTIVE SEED AND SENTINEL LYMPH NODE BIOPSY;   Surgeon: Morene Olives, MD;  Location: [facility];  Service: General;  Laterality: Left;   CESAREAN SECTION  03/2001   COLONOSCOPY     WISDOM TOOTH EXTRACTION       Social History:  The patient  reports that she quit smoking about 43 years ago. Her smoking use included cigarettes. She has never used smokeless tobacco. She reports current alcohol use. She reports that she does not use drugs.   Family History:  The patient's family history includes COPD in her father; Cancer in her maternal uncle and paternal uncle; Depression in her mother; Epilepsy in her mother; Heart disease in her father; Hyperlipidemia in her father; Hypertension in her father; Intracerebral hemorrhage in her mother; Sleep apnea in her father; Stroke in her mother.    ROS:  Please see the history of present illness. All other systems are reviewed and  Negative to the above problem except as noted.    PHYSICAL EXAM: VS:  BP 108/80 (BP Location: Left Arm, Patient Position: Sitting, Cuff Size: Large)   Pulse  68   Resp 16   Ht 5' 5 (1.651 m)   Wt 267 lb (121.1 kg)   SpO2 97%   BMI 44.43 kg/m   GEN: Obese 63 yo  in no acute distress  HEENT: normal  Neck: no JVD, carotid bruits, Cardiac: RRR; no murmur,   No LE edema  Respiratory:  clear to auscultation bilaterally GI: soft, nontender,  No masses  No hepatomegaly  MS: no deformity Moving all extremities     EKG:  EKG is ordered today.  NSR 66 bpm  INcomp RBBB   Lipid Panel    Component Value Date/Time   CHOL 176 08/04/2019 1147   TRIG 89 08/04/2019 1147   HDL 48 08/04/2019 1147   LDLCALC 112 (H) 08/04/2019 1147      Wt Readings from Last 3 Encounters:  03/06/23 267 lb (121.1 kg)  07/12/22 266 lb (120.7 kg)  07/11/21 263 lb 9.6 oz (119.6 kg)      ASSESSMENT AND PLAN:  1  PAF  Spells started in 2024 (march)  Review of her Apple watch they appear to be about 1x per month   Longest episode was 4 hours   She senses     Will get  echo      Will get sleep evaluation CHADSVASc score is 2 (female, HTN)   She has not started anticoagulation as less than 4 ours   2 HTN   Well controlled   Hx of preeclampsia with last child  Started losartan  after    Follow  3  ? OSA  Pt reports some snoring   With PAF important to r/o as it could be exacerbating spells   4  LIpids   Will set up for lipomed, Lpa and Apo B  5  Metabolics   Will check TSH and A1C     Current medicines are reviewed at length with the patient today.  The patient does not have concerns regarding medicines.  Signed, Vina Gull, MD  03/06/2023 10:33 AM    [facility] [address] [place], [place], [place]  [address] Phone: [phone]; Fax: [phone]

## 2023-03-07 ENCOUNTER — Encounter: Payer: Self-pay | Admitting: Hematology

## 2023-03-07 ENCOUNTER — Other Ambulatory Visit (HOSPITAL_COMMUNITY): Payer: Self-pay

## 2023-03-07 LAB — NMR, LIPOPROFILE
Cholesterol, Total: 185 mg/dL (ref 100–199)
HDL Particle Number: 26.9 umol/L — ABNORMAL LOW (ref >=30.5)
HDL-C: 39 mg/dL — ABNORMAL LOW (ref >39)
LDL Particle Number: 1459 nmol/L — ABNORMAL HIGH (ref <1000)
LDL Size: 21.3 nm (ref >20.5)
LDL-C (NIH Calc): 123 mg/dL — ABNORMAL HIGH (ref 0–99)
LP-IR Score: 64 — ABNORMAL HIGH (ref <=45)
Small LDL Particle Number: 570 nmol/L — ABNORMAL HIGH (ref <=527)
Triglycerides: 129 mg/dL (ref 0–149)

## 2023-03-07 LAB — CBC
Hematocrit: 43.7 % (ref 34.0–46.6)
Hemoglobin: 14.3 g/dL (ref 11.1–15.9)
MCH: 28.9 pg (ref 26.6–33.0)
MCHC: 32.7 g/dL (ref 31.5–35.7)
MCV: 89 fL (ref 79–97)
Platelets: 289 10*3/uL (ref 150–450)
RBC: 4.94 x10E6/uL (ref 3.77–5.28)
RDW: 12.8 % (ref 11.7–15.4)
WBC: 8.3 10*3/uL (ref 3.4–10.8)

## 2023-03-07 LAB — BASIC METABOLIC PANEL
BUN/Creatinine Ratio: 20 (ref 12–28)
BUN: 14 mg/dL (ref 8–27)
CO2: 23 mmol/L (ref 20–29)
Calcium: 9.7 mg/dL (ref 8.7–10.3)
Chloride: 104 mmol/L (ref 96–106)
Creatinine, Ser: 0.69 mg/dL (ref 0.57–1.00)
Glucose: 92 mg/dL (ref 70–99)
Potassium: 4.4 mmol/L (ref 3.5–5.2)
Sodium: 140 mmol/L (ref 134–144)
eGFR: 99 mL/min/{1.73_m2} (ref >59)

## 2023-03-07 LAB — TSH: TSH: 3.18 u[IU]/mL (ref 0.450–4.500)

## 2023-03-07 LAB — HEMOGLOBIN A1C
Est. average glucose Bld gHb Est-mCnc: 126 mg/dL
Hgb A1c MFr Bld: 6 % — ABNORMAL HIGH (ref 4.8–5.6)

## 2023-03-07 LAB — LIPOPROTEIN A (LPA): Lipoprotein (a): 56.1 nmol/L (ref <75.0)

## 2023-03-07 LAB — APOLIPOPROTEIN B: Apolipoprotein B: 103 mg/dL — ABNORMAL HIGH (ref <90)

## 2023-03-07 MED ORDER — LOSARTAN POTASSIUM 50 MG PO TABS
50.0000 mg | ORAL_TABLET | Freq: Every day | ORAL | 1 refills | Status: DC
  Filled 2023-03-14 (×2): qty 90, 90d supply, fill #0

## 2023-03-09 ENCOUNTER — Other Ambulatory Visit: Payer: Self-pay

## 2023-03-09 DIAGNOSIS — I4891 Unspecified atrial fibrillation: Secondary | ICD-10-CM

## 2023-03-12 ENCOUNTER — Ambulatory Visit: Payer: Commercial Managed Care - PPO | Attending: Internal Medicine

## 2023-03-12 DIAGNOSIS — I4891 Unspecified atrial fibrillation: Secondary | ICD-10-CM

## 2023-03-12 NOTE — Progress Notes (Unsigned)
 Enrolled for [employer] to mail a ZIO XT long term holter monitor to the patients address on file.

## 2023-03-13 ENCOUNTER — Encounter: Payer: Self-pay | Admitting: Internal Medicine

## 2023-03-14 ENCOUNTER — Other Ambulatory Visit (HOSPITAL_COMMUNITY): Payer: Self-pay

## 2023-03-14 ENCOUNTER — Ambulatory Visit (HOSPITAL_COMMUNITY)
Admission: RE | Admit: 2023-03-14 | Discharge: 2023-03-14 | Disposition: A | Discharge status: Home / Self Care | Payer: Commercial Managed Care - PPO | Source: Ambulatory Visit | Attending: Internal Medicine | Admitting: Internal Medicine

## 2023-03-14 ENCOUNTER — Telehealth: Payer: Self-pay

## 2023-03-14 ENCOUNTER — Ambulatory Visit
Admission: RE | Admit: 2023-03-14 | Discharge: 2023-03-14 | Disposition: A | Discharge status: Home / Self Care | Payer: Commercial Managed Care - PPO | Source: Ambulatory Visit | Attending: Nurse Practitioner | Admitting: Nurse Practitioner

## 2023-03-14 DIAGNOSIS — I10 Essential (primary) hypertension: Secondary | ICD-10-CM | POA: Diagnosis not present

## 2023-03-14 DIAGNOSIS — Z170 Estrogen receptor positive status [ER+]: Secondary | ICD-10-CM

## 2023-03-14 DIAGNOSIS — E2839 Other primary ovarian failure: Secondary | ICD-10-CM | POA: Diagnosis not present

## 2023-03-14 DIAGNOSIS — N958 Other specified menopausal and perimenopausal disorders: Secondary | ICD-10-CM | POA: Diagnosis not present

## 2023-03-14 DIAGNOSIS — Z853 Personal history of malignant neoplasm of breast: Secondary | ICD-10-CM | POA: Diagnosis not present

## 2023-03-14 DIAGNOSIS — R0609 Other forms of dyspnea: Secondary | ICD-10-CM

## 2023-03-14 LAB — ECHOCARDIOGRAM COMPLETE
AR max vel: 2.33 cm2
AV Area VTI: 2.22 cm2
AV Area mean vel: 2.56 cm2
AV Mean grad: 7 mm[Hg]
AV Peak grad: 15.7 mm[Hg]
Ao pk vel: 1.98 m/s
Area-P 1/2: 3.53 cm2
S' Lateral: 2.6 cm

## 2023-03-14 NOTE — Telephone Encounter (Signed)
 Ordering provider: Avanell Bob Associated diagnoses: Primary Hypertension WatchPAT PA obtained on 03/14/2023 by Maceo Sax, LPN. Authorization: Yes; tracking ID  [id number] Patient notified of PIN (1234) on 03/14/2023 via Notification Method: phone.

## 2023-03-14 NOTE — Progress Notes (Signed)
*  PRELIMINARY RESULTS* Echocardiogram 2D Echocardiogram has been performed.  Bernis Brisker 03/14/2023, 1:38 PM

## 2023-03-15 ENCOUNTER — Ambulatory Visit
Admission: RE | Admit: 2023-03-15 | Discharge: 2023-03-15 | Disposition: A | Discharge status: Home / Self Care | Payer: Commercial Managed Care - PPO | Source: Ambulatory Visit | Attending: Family Medicine | Admitting: Family Medicine

## 2023-03-15 ENCOUNTER — Other Ambulatory Visit: Payer: Self-pay

## 2023-03-15 VITALS — BP 127/82 | HR 86 | Temp 98.6°F | Resp 18

## 2023-03-15 DIAGNOSIS — I4891 Unspecified atrial fibrillation: Secondary | ICD-10-CM | POA: Diagnosis not present

## 2023-03-15 DIAGNOSIS — J101 Influenza due to other identified influenza virus with other respiratory manifestations: Secondary | ICD-10-CM

## 2023-03-15 LAB — POCT RAPID STREP A (OFFICE): Rapid Strep A Screen: NEGATIVE

## 2023-03-15 LAB — POC COVID19/FLU A&B COMBO
Covid Antigen, POC: NEGATIVE
Influenza A Antigen, POC: POSITIVE — AB
Influenza B Antigen, POC: NEGATIVE

## 2023-03-15 MED ORDER — OSELTAMIVIR PHOSPHATE 75 MG PO CAPS
75.0000 mg | ORAL_CAPSULE | Freq: Two times a day (BID) | ORAL | 0 refills | Status: DC
Start: 2023-03-15 — End: 2023-05-31

## 2023-03-15 MED ORDER — PROMETHAZINE-DM 6.25-15 MG/5ML PO SYRP: 5.0000 mL | ORAL_SOLUTION | Freq: Four times a day (QID) | ORAL | 0 refills | Status: DC | PRN

## 2023-03-15 NOTE — ED Triage Notes (Addendum)
Pt reports soreness on right side of neck on Sunday, fever last night, sore throat, bilateral ear pain/pressure, generalized body aches, cough last several days. Home covid test negative. Last dose of tylenol this am.

## 2023-03-15 NOTE — ED Provider Notes (Signed)
[facility]    CSN: [id number] Arrival date & time: 03/15/23  0907      History   Chief Complaint Chief Complaint  Patient presents with   Fever    I started running a fever of 101.3 yesterday afternoon and continues this morning .  Cough, and sore throat. - Entered by patient    HPI Nicole Schmitt is a 63 y.o. female.   Patient presenting today with several day history of sore throat, fever, bilateral ear pain and pressure, congestion, body aches, cough.  Denies chest pain, shortness of breath, abdominal pain, nausea vomiting or diarrhea.  Trying Tylenol with minimal relief.  Multiple sick contacts recently.    Past Medical History:  Diagnosis Date   Breast cancer (HCC) 2017   Left Breast Cancer   Cancer [facility]) 1990   cervical    Constipation    Heartburn    Hypertension    Joint pain    Palpitations    Personal history of chemotherapy 2017   Left Breast Cancer   Personal history of radiation therapy 2017   Left Breast Cancer   Seizures (HCC)    > 20 years following fall from horse   SOB (shortness of breath)    Swelling of both lower extremities    Vertigo     Patient Active Problem List   Diagnosis Date Noted   Chemotherapy-induced peripheral neuropathy (HCC) 01/01/2018   Lobar pneumonia, unspecified organism (HCC) 02/15/2016   Febrile neutropenia (HCC) 02/12/2016   Community acquired pneumonia of left lower lobe of lung    Cutaneous abscess of right axilla    HTN (hypertension) 01/13/2016   Breast cancer of upper-outer quadrant of left female breast (HCC) 11/23/2015    Past Surgical History:  Procedure Laterality Date   BREAST LUMPECTOMY Left 11/16/2015   Malignant   BREAST LUMPECTOMY WITH RADIOACTIVE SEED AND SENTINEL LYMPH NODE BIOPSY Left 11/16/2015   Procedure: BREAST LUMPECTOMY WITH RADIOACTIVE SEED AND SENTINEL LYMPH NODE BIOPSY;  Surgeon: Glenna Fellows, MD;  Location: [facility];  Service: General;  Laterality:  Left;   CESAREAN SECTION  03/2001   COLONOSCOPY     WISDOM TOOTH EXTRACTION      OB History     Gravida  1   Para  1   Term      Preterm      AB      Living         SAB      IAB      Ectopic      Multiple      Live Births               Home Medications    Prior to Admission medications   Medication Sig Start Date End Date Taking? Authorizing Provider  oseltamivir (TAMIFLU) 75 MG capsule Take 1 capsule (75 mg total) by mouth every 12 (twelve) hours. 03/15/23  Yes Particia Nearing, PA-C  promethazine-dextromethorphan (PROMETHAZINE-DM) 6.25-15 MG/5ML syrup Take 5 mLs by mouth 4 (four) times daily as needed. 03/15/23  Yes Particia Nearing, PA-C  Acetaminophen (TYLENOL PO) Take by mouth.    [provider]  hydrochlorothiazide (HYDRODIURIL) 25 MG tablet Take 25 mg by mouth daily. 12.5 mg to 25 mg dependent on bp medication Patient not taking: Reported on 03/06/2023    [provider]  IBUPROFEN PO Take by mouth.    [provider]  letrozole (FEMARA) 2.5 MG tablet Take 1  tablet (2.5 mg total) by mouth daily. 06/20/22   Pollyann Samples, NP  losartan (COZAAR) 50 MG tablet Take 1 tablet (50 mg total) by mouth daily. 03/07/23   Carlean Jews, NP  tretinoin (RETIN-A) 0.025 % cream Apply topically daily. 04/13/20   [provider]    Family History Family History  Problem Relation Age of Onset   Cancer Maternal Uncle        liver cancer/environmental exposure   Cancer Paternal Uncle        liver cancer/environmental exposure   Epilepsy Mother    Intracerebral hemorrhage Mother    Stroke Mother    Depression Mother    COPD Father    Hypertension Father    Hyperlipidemia Father    Heart disease Father    Sleep apnea Father    Breast cancer Neg Hx     Social History Social History   Tobacco Use   Smoking status: Former    Current packs/day: 0.00    Types: Cigarettes    Quit date: 1982    Years since  quitting: 43.0   Smokeless tobacco: Never  Substance Use Topics   Alcohol use: Yes    Comment: rare   Drug use: No     Allergies   Patient has no known allergies.   Review of Systems Review of Systems Per HPI  Physical Exam Triage Vital Signs ED Triage Vitals  Encounter Vitals Group     BP 03/15/23 0944 127/82     Systolic BP Percentile --      Diastolic BP Percentile --      Pulse Rate 03/15/23 0944 86     Resp 03/15/23 0944 18     Temp 03/15/23 0944 98.6 F (37 C)     Temp Source 03/15/23 0944 Oral     SpO2 03/15/23 0944 93 %     Weight --      Height --      Head Circumference --      Peak Flow --      Pain Score 03/15/23 0941 4     Pain Loc --      Pain Education --      Exclude from Growth Chart --    No data found.  Updated Vital Signs BP 127/82 (BP Location: Right Arm)   Pulse 86   Temp 98.6 F (37 C) (Oral)   Resp 18   SpO2 93%   Visual Acuity Right Eye Distance:   Left Eye Distance:   Bilateral Distance:    Right Eye Near:   Left Eye Near:    Bilateral Near:     Physical Exam Vitals and nursing note reviewed.  Constitutional:      Appearance: Normal appearance.  HENT:     Head: Atraumatic.     Right Ear: Tympanic membrane and external ear normal.     Left Ear: Tympanic membrane and external ear normal.     Nose: Rhinorrhea present.     Mouth/Throat:     Mouth: Mucous membranes are moist.     Pharynx: Posterior oropharyngeal erythema present.  Eyes:     Extraocular Movements: Extraocular movements intact.     Conjunctiva/sclera: Conjunctivae normal.  Cardiovascular:     Rate and Rhythm: Normal rate and regular rhythm.     Heart sounds: Normal heart sounds.  Pulmonary:     Effort: Pulmonary effort is normal.     Breath sounds: Normal breath sounds. No wheezing or  rales.  Musculoskeletal:        General: Normal range of motion.     Cervical back: Normal range of motion and neck supple.  Skin:    General: Skin is warm and dry.   Neurological:     Mental Status: She is alert and oriented to person, place, and time.  Psychiatric:        Mood and Affect: Mood normal.        Thought Content: Thought content normal.      UC Treatments / Results  Labs (all labs ordered are listed, but only abnormal results are displayed) Labs Reviewed  POC COVID19/FLU A&B COMBO - Abnormal; Notable for the following components:      Result Value   Influenza A Antigen, POC Positive (*)    All other components within normal limits  POCT RAPID STREP A (OFFICE)    EKG   Radiology ECHOCARDIOGRAM COMPLETE Result Date: 03/14/2023    ECHOCARDIOGRAM REPORT   Patient Name:   CIARA SOULIA Date of Exam: 03/14/2023 Medical Rec #:  [id number]         Height:       65.0 in Accession #:    [id number]        Weight:       267.0 lb Date of Birth:  1960-12-09         BSA:          2.237 m Patient Age:    62 years          BP:           137/94 mmHg Patient Gender: F                 HR:           91 bpm. [facility] Procedure: 2D Echo, Cardiac Doppler and Color Doppler Indications:    Dyspnea R06.00  History:        Patient has no prior history of Echocardiogram examinations.                 Risk Factors:Hypertension. Hx of palpitations and breast cancer.  Sonographer:    Celesta Gentile RCS Referring Phys: [id number] PAULA V ROSS IMPRESSIONS  1. Left ventricular ejection fraction, by estimation, is 65 to 70%. The left ventricle has normal function. The left ventricle has no regional wall motion abnormalities. Left ventricular diastolic parameters were normal.  2. Right ventricular systolic function is normal. The right ventricular size is normal. Tricuspid regurgitation signal is inadequate for assessing PA pressure.  3. The mitral valve is grossly normal. No evidence of mitral valve regurgitation.  4. The aortic valve is tricuspid. Aortic valve regurgitation is not visualized. No aortic stenosis is present. Aortic valve mean gradient measures 7.0  mmHg.  5. The inferior vena cava is normal in size with greater than 50% respiratory variability, suggesting right atrial pressure of 3 mmHg. Comparison(s): No prior Echocardiogram. FINDINGS  Left Ventricle: Left ventricular ejection fraction, by estimation, is 65 to 70%. The left ventricle has normal function. The left ventricle has no regional wall motion abnormalities. The left ventricular internal cavity size was normal in size. There is  borderline left ventricular hypertrophy. Left ventricular diastolic parameters were normal. Right Ventricle: The right ventricular size is normal. No increase in right ventricular wall thickness. Right ventricular systolic function is normal. Tricuspid regurgitation signal is inadequate for assessing PA pressure. Left Atrium: Left atrial size was normal in size. Right  Atrium: Right atrial size was normal in size. Pericardium: There is no evidence of pericardial effusion. Mitral Valve: The mitral valve is grossly normal. No evidence of mitral valve regurgitation. Tricuspid Valve: The tricuspid valve is grossly normal. Tricuspid valve regurgitation is trivial. Aortic Valve: The aortic valve is tricuspid. There is mild aortic valve annular calcification. Aortic valve regurgitation is not visualized. No aortic stenosis is present. Aortic valve mean gradient measures 7.0 mmHg. Aortic valve peak gradient measures 15.7 mmHg. Aortic valve area, by VTI measures 2.22 cm. Pulmonic Valve: The pulmonic valve was not well visualized. Pulmonic valve regurgitation is trivial. Aorta: The aortic root is normal in size and structure. Venous: The inferior vena cava is normal in size with greater than 50% respiratory variability, suggesting right atrial pressure of 3 mmHg. IAS/Shunts: No atrial level shunt detected by color flow Doppler.  LEFT VENTRICLE PLAX 2D LVIDd:         4.70 cm   Diastology LVIDs:         2.60 cm   LV e' medial:    9.14 cm/s LV PW:         1.10 cm   LV E/e' medial:  10.1 LV  IVS:        0.90 cm   LV e' lateral:   12.70 cm/s LVOT diam:     1.90 cm   LV E/e' lateral: 7.3 LV SV:         77 LV SV Index:   34 LVOT Area:     2.84 cm  RIGHT VENTRICLE RV S prime:     21.10 cm/s TAPSE (M-mode): 2.4 cm LEFT ATRIUM             Index        RIGHT ATRIUM           Index LA diam:        4.00 cm 1.79 cm/m   RA Area:     18.20 cm LA Vol (A2C):   54.0 ml 24.14 ml/m  RA Volume:   49.40 ml  22.09 ml/m LA Vol (A4C):   59.3 ml 26.51 ml/m LA Biplane Vol: 57.1 ml 25.53 ml/m  AORTIC VALVE AV Area (Vmax):    2.33 cm AV Area (Vmean):   2.56 cm AV Area (VTI):     2.22 cm AV Vmax:           198.00 cm/s AV Vmean:          113.000 cm/s AV VTI:            0.347 m AV Peak Grad:      15.7 mmHg AV Mean Grad:      7.0 mmHg LVOT Vmax:         163.00 cm/s LVOT Vmean:        102.000 cm/s LVOT VTI:          0.272 m LVOT/AV VTI ratio: 0.78  AORTA Ao Root diam: 2.80 cm MITRAL VALVE MV Area (PHT): 3.53 cm     SHUNTS MV Decel Time: 215 msec     Systemic VTI:  0.27 m MV E velocity: 92.60 cm/s   Systemic Diam: 1.90 cm MV A velocity: 107.00 cm/s MV E/A ratio:  0.87 Nona Dell MD Electronically signed by Nona Dell MD Signature Date/Time: 03/14/2023/1:44:41 PM    Final    DG Bone Density Result Date: 03/14/2023 EXAM: DUAL X-RAY ABSORPTIOMETRY (DXA) FOR BONE MINERAL DENSITY IMPRESSION: Referring Physician:  Pollyann Samples Your  patient completed a bone mineral density test using GE Lunar iDXA system (analysis version: 16). Technologist: BEC PATIENT: Name: Naydine, Rogness Patient ID: [id number] Birth Date: 12-Nov-1960 Height: 64.5 in. Sex: Female Measured: 03/14/2023 Weight: 267.8 lbs. Indications: Breast Cancer History, Caucasian, Estrogen Deficient, Family History of Osteoporosis, Femara, High risk medication use, History of Fracture (Adult) (V15.51), Postmenopausal, Secondary Osteoporosis Fractures: Left Foot Treatments: Calcium (E943.0), Hormone Therapy For Cancer, Vitamin D (E933.5) ASSESSMENT: The BMD  measured at Femur Neck Left is 0.802 g/cm2 with a T-score of -1.7. This patient is considered osteopenic/low bone mass according to World Health Organization [facility]) criteria. L4 was excluded due to degenerative changes. The quality of the exam is limited by patient body habitus. Site Region Measured Date Measured Age YA BMD Significant CHANGE T-score DualFemur Neck Left 03/14/2023 60.9 -1.7 0.802 g/cm2 * DualFemur Neck Left  01/28/2021    58.8         -1.2    0.878 g/cm2 AP Spine L1-L3 03/14/2023 60.9 -0.8 1.079 g/cm2 * AP Spine L1-L3 01/28/2021 58.8 -1.2 1.034 g/cm2 * DualFemur Total Mean 03/14/2023 60.9 -0.7 0.925 g/cm2 * DualFemur Total Mean 01/28/2021    58.8         -0.4    0.957 g/cm2 World Health Organization [facility]) criteria for post-menopausal, Caucasian Women: Normal       T-score at or above -1 SD Osteopenia   T-score between -1 and -2.5 SD Osteoporosis T-score at or below -2.5 SD RECOMMENDATION: 1. All patients should optimize calcium and vitamin D intake. 2. Consider FDA-approved medical therapies in postmenopausal women and men aged 57 years and older, based on the following: a. A hip or vertebral (clinical or morphometric) fracture. b. T-score = -2.5 at the femoral neck or spine after appropriate evaluation to exclude secondary causes. c. Low bone mass (T-score between -1.0 and -2.5 at the femoral neck or spine) and a 10-year probability of a hip fracture = 3% or a 10-year probability of a major osteoporosis-related fracture = 20% based on the [place]-adapted WHO algorithm. d. Clinician judgment and/or patient preferences may indicate treatment for people with 10-year fracture probabilities above or below these levels. FOLLOW-UP: Patients with diagnosis of osteoporosis or at high risk for fracture should have regular bone mineral density tests.? Patients eligible for Medicare are allowed routine testing every 2 years.? The testing frequency can be increased to one year for patients who have rapidly  progressing disease, are receiving or discontinuing medical therapy to restore bone mass, or have additional risk factors. I have reviewed this study and agree with the findings. [facility] FRAX* 10-year Probability of Fracture Based on femoral neck BMD: DualFemur (Left) Major Osteoporotic Fracture: 12.8% Hip Fracture:                1.2% Population:                  [place] (Caucasian) Risk Factors: History of Fracture (Adult) (V15.51), Secondary Osteoporosis *FRAX is a Armed forces logistics/support/administrative officer of the [employer] of [employer] for Metabolic Bone Disease, a World Science writer (WHO) [employer]. ASSESSMENT: The probability of a major osteoporotic fracture is 12.8% within the next ten years. The probability of a hip fracture is 1.2% within the next ten years. Electronically Signed   By: Romona Curls M.D.   On: 03/14/2023 08:39    Procedures Procedures (including critical care time)  Medications Ordered in UC Medications - No data to display  Initial Impression / Assessment  and Plan / UC Course  I have reviewed the triage vital signs and the nursing notes.  Pertinent labs & imaging results that were available during my care of the patient were reviewed by me and considered in my medical decision making (see chart for details).     Rapid flu positive for flu A.  Treat with Tamiflu, Phenergan DM, supportive over-the-counter medications and home care.  Return for worsening symptoms.  Final Clinical Impressions(s) / UC Diagnoses   Final diagnoses:  Influenza A   Discharge Instructions   None    ED Prescriptions     Medication Sig Dispense Auth. Provider   oseltamivir (TAMIFLU) 75 MG capsule Take 1 capsule (75 mg total) by mouth every 12 (twelve) hours. 10 capsule Particia Nearing, [place]   promethazine-dextromethorphan (PROMETHAZINE-DM) 6.25-15 MG/5ML syrup Take 5 mLs by mouth 4 (four) times daily as needed. 100 mL Particia Nearing, [place]       PDMP not reviewed this encounter.   Particia Nearing, [place] 03/15/23 1951

## 2023-03-16 ENCOUNTER — Encounter (INDEPENDENT_AMBULATORY_CARE_PROVIDER_SITE_OTHER): Payer: Self-pay | Admitting: Cardiology

## 2023-03-16 DIAGNOSIS — G4733 Obstructive sleep apnea (adult) (pediatric): Secondary | ICD-10-CM | POA: Diagnosis not present

## 2023-03-19 ENCOUNTER — Encounter: Payer: Self-pay | Admitting: Internal Medicine

## 2023-03-19 ENCOUNTER — Ambulatory Visit: Payer: Commercial Managed Care - PPO | Attending: Internal Medicine

## 2023-03-19 DIAGNOSIS — I10 Essential (primary) hypertension: Secondary | ICD-10-CM

## 2023-03-19 NOTE — Procedures (Signed)
   SLEEP STUDY REPORT Patient Information Study Date: 03/16/2023 Patient Name: Nicole Schmitt Patient ID: [id number] Birth Date: 03-Apr-1960 Age: 63 Gender: Female BMI: 44.4 (W=267 lb, H=5' 5'') Stopbang: 5 Referring Physician: Dietrich Pates, MD  TEST DESCRIPTION: Home sleep apnea testing was completed using the WatchPat, a Type 1 device, utilizing peripheral arterial tonometry (PAT), chest movement, actigraphy, pulse oximetry, pulse rate, body position and snore. AHI was calculated with apnea and hypopnea using valid sleep time as the denominator. RDI includes apneas, hypopneas, and RERAs. The data acquired and the scoring of sleep and all associated events were performed in accordance with the recommended standards and specifications as outlined in the AASM Manual for the Scoring of Sleep and Associated Events 2.2.0 (2015).  FINDINGS:  1. Moderate Obstructive Sleep Apnea with AHI 14.6/hr overalll but severe during REM sleep with REM AHI 36.6/hr.  2. No Central Sleep Apnea with pAHIc 0.4/hr.  3. Oxygen desaturations as low as 79%.  4. Moderate to severe snoring was present. O2 sats were < 88% for 7.5 min.  5. Total sleep time was 7 hrs and 52 min.  6. 27.9% of total sleep time was spent in REM sleep.  7. Normal sleep onset latency at 22 min  8. Normal REM sleep onset latency at 95 min.  9. Total awakenings were 13. 10. Arrhythmia detection: None.  DIAGNOSIS: Moderate Obstructive Sleep Apnea (G47.33) Nocturnal Hypoxemia  RECOMMENDATIONS: 1. Clinical correlation of these findings is necessary. The decision to treat obstructive sleep apnea (OSA) is usually based on the presence of apnea symptoms or the presence of associated medical conditions such as Hypertension, Congestive Heart Failure, Atrial Fibrillation or Obesity. The most common symptoms of OSA are snoring, gasping for breath while sleeping, daytime sleepiness and fatigue.  2. Initiating apnea therapy is recommended given  the presence of symptoms and/or associated conditions. Recommend proceeding with one of the following:   a. Auto-CPAP therapy with a pressure range of 5-20cm H2O.   b. An oral appliance (OA) that can be obtained from certain dentists with expertise in sleep medicine. These are primarily of use in non-obese patients with mild and moderate disease.   c. An ENT consultation which may be useful to look for specific causes of obstruction and possible treatment options.   d. If patient is intolerant to PAP therapy, consider referral to ENT for evaluation for hypoglossal nerve stimulator.  3. Close follow-up is necessary to ensure success with CPAP or oral appliance therapy for maximum benefit .  4. A follow-up oximetry study on CPAP is recommended to assess the adequacy of therapy and determine the need for supplemental oxygen or the potential need for Bi-level therapy. An arterial blood gas to determine the adequacy of baseline ventilation and oxygenation should also be considered.  5. Healthy sleep recommendations include: adequate nightly sleep (normal 7-9 hrs/night), avoidance of caffeine after noon and alcohol near bedtime, and maintaining a sleep environment that is cool, dark and quiet.  6. Weight loss for overweight patients is recommended. Even modest amounts of weight loss can significantly improve the severity of sleep apnea.  7. Snoring recommendations include: weight loss where appropriate, side sleeping, and avoidance of alcohol before bed.  8. Operation of motor vehicle should not be performed when sleepy.  Signature: Armanda Magic, MD; [facility] Diplomat, American Board of Sleep Medicine Electronically Signed: 03/19/2023 2:12:41 PM

## 2023-03-21 ENCOUNTER — Telehealth: Payer: Self-pay | Admitting: *Deleted

## 2023-03-21 DIAGNOSIS — G4733 Obstructive sleep apnea (adult) (pediatric): Secondary | ICD-10-CM

## 2023-03-21 DIAGNOSIS — I10 Essential (primary) hypertension: Secondary | ICD-10-CM

## 2023-03-21 NOTE — Telephone Encounter (Signed)
The patient has been notified of the result and verbalized understanding.  All questions (if any) were answered. Latrelle Dodrill, CMA 03/21/2023 1:24 PM    Will precert titration

## 2023-03-21 NOTE — Telephone Encounter (Signed)
-----   Message from Armanda Magic sent at 03/19/2023  2:14 PM EST ----- Please let patient know that they have sleep apnea.  Recommend therapeutic CPAP titration for treatment of patient's sleep disordered breathing.  If unable to perform an in lab titration then initiate ResMed auto CPAP from 4 to 15cm H2O with heated humidity and mask of choice and overnight pulse ox on CPAP.

## 2023-04-04 DIAGNOSIS — I4891 Unspecified atrial fibrillation: Secondary | ICD-10-CM | POA: Diagnosis not present

## 2023-04-06 ENCOUNTER — Encounter: Payer: Self-pay | Admitting: Internal Medicine

## 2023-04-06 ENCOUNTER — Other Ambulatory Visit: Payer: Self-pay

## 2023-04-06 MED ORDER — METOPROLOL SUCCINATE ER 25 MG PO TB24: 25.0000 mg | ORAL_TABLET | Freq: Every day | ORAL | 3 refills | Status: DC

## 2023-04-06 MED ORDER — LOSARTAN POTASSIUM 25 MG PO TABS
25.0000 mg | ORAL_TABLET | Freq: Every day | ORAL | 3 refills | Status: DC
  Filled 2023-05-07: qty 90, 90d supply, fill #0
  Filled 2023-08-04: qty 90, 90d supply, fill #1

## 2023-05-07 ENCOUNTER — Other Ambulatory Visit (HOSPITAL_COMMUNITY): Payer: Self-pay

## 2023-05-07 MED ORDER — METOPROLOL SUCCINATE ER 25 MG PO TB24
25.0000 mg | ORAL_TABLET | Freq: Every day | ORAL | 3 refills | Status: DC
  Filled 2023-05-07: qty 90, 90d supply, fill #0
  Filled 2023-08-04: qty 90, 90d supply, fill #1

## 2023-05-11 ENCOUNTER — Telehealth: Payer: Self-pay

## 2023-05-11 NOTE — Telephone Encounter (Signed)
**Note De-Identified Nicole Schmitt Obfuscation** Per [place] AET[employer] PPO: PA is not required for CPT Code: 16109 (CPAP Titration).   I have transferred the CPAP Titration order to the sleep lab so they can contact the pt too schedule test.

## 2023-05-20 ENCOUNTER — Ambulatory Visit (HOSPITAL_BASED_OUTPATIENT_CLINIC_OR_DEPARTMENT_OTHER): Admitting: Cardiology

## 2023-05-24 ENCOUNTER — Encounter: Payer: Self-pay | Admitting: Hematology

## 2023-05-28 NOTE — Progress Notes (Signed)
 Erroneous encounter This encounter was created in error - please disregard.

## 2023-05-28 NOTE — Procedures (Signed)
 Erroneous encounter

## 2023-05-30 ENCOUNTER — Encounter: Payer: Self-pay | Admitting: Hematology

## 2023-05-31 ENCOUNTER — Other Ambulatory Visit (HOSPITAL_COMMUNITY)
Admission: RE | Admit: 2023-05-31 | Discharge: 2023-05-31 | Disposition: A | Discharge status: Home / Self Care | Source: Ambulatory Visit | Attending: Nurse Practitioner | Admitting: Nurse Practitioner

## 2023-05-31 ENCOUNTER — Other Ambulatory Visit (HOSPITAL_COMMUNITY): Payer: Self-pay

## 2023-05-31 ENCOUNTER — Encounter: Payer: Self-pay | Admitting: Nurse Practitioner

## 2023-05-31 ENCOUNTER — Ambulatory Visit (INDEPENDENT_AMBULATORY_CARE_PROVIDER_SITE_OTHER): Payer: BC Managed Care – PPO | Admitting: Nurse Practitioner

## 2023-05-31 VITALS — BP 126/86 | HR 70 | Ht 65.0 in | Wt 269.0 lb

## 2023-05-31 DIAGNOSIS — M8589 Other specified disorders of bone density and structure, multiple sites: Secondary | ICD-10-CM | POA: Diagnosis not present

## 2023-05-31 DIAGNOSIS — Z1331 Encounter for screening for depression: Secondary | ICD-10-CM

## 2023-05-31 DIAGNOSIS — N952 Postmenopausal atrophic vaginitis: Secondary | ICD-10-CM

## 2023-05-31 DIAGNOSIS — Z853 Personal history of malignant neoplasm of breast: Secondary | ICD-10-CM

## 2023-05-31 DIAGNOSIS — N898 Other specified noninflammatory disorders of vagina: Secondary | ICD-10-CM | POA: Diagnosis not present

## 2023-05-31 DIAGNOSIS — Z780 Asymptomatic menopausal state: Secondary | ICD-10-CM

## 2023-05-31 DIAGNOSIS — B9689 Other specified bacterial agents as the cause of diseases classified elsewhere: Secondary | ICD-10-CM

## 2023-05-31 DIAGNOSIS — Z01419 Encounter for gynecological examination (general) (routine) without abnormal findings: Secondary | ICD-10-CM

## 2023-05-31 DIAGNOSIS — N760 Acute vaginitis: Secondary | ICD-10-CM

## 2023-05-31 DIAGNOSIS — N950 Postmenopausal bleeding: Secondary | ICD-10-CM | POA: Diagnosis not present

## 2023-05-31 DIAGNOSIS — Z124 Encounter for screening for malignant neoplasm of cervix: Secondary | ICD-10-CM

## 2023-05-31 LAB — WET PREP FOR TRICH, YEAST, CLUE

## 2023-05-31 MED ORDER — METRONIDAZOLE 500 MG PO TABS
500.0000 mg | ORAL_TABLET | Freq: Two times a day (BID) | ORAL | 0 refills | Status: DC
  Filled 2023-05-31: qty 14, 7d supply, fill #0

## 2023-05-31 NOTE — Progress Notes (Signed)
 Nicole Schmitt 01/26/1961 [id number]   History:  63 y.o. G1P1 presents as new patient to establish care. Postmenopausal. Reports vaginal spotting one month ago with wiping x 1 day. 2017 left breast cancer managed with lumpectomy, chemo, radiation and anti-estrogen therapy (Still on Letrozole). H/O cervical cancer age 63. Normal paps since. Complains of vaginal burning/itching. Assumes it is from dryness/atrophy. A fib managed by cardiology.   Gynecologic History No LMP recorded. Patient is postmenopausal.   Contraception/Family planning: post menopausal status Sexually active: No  Health Maintenance Last Pap: 2-3 years ago per patient. Results were: Normal Last mammogram: 12/04/2022. Results were: Normal Last colonoscopy: around age 63 Last Dexa: 03/14/2023. Results were: T-score -1.7, FRAX 12.8% / 1.2%     05/31/2023   10:27 AM  Depression screen PHQ 2/9  Decreased Interest 0  Down, Depressed, Hopeless 0  PHQ - 2 Score 0     Past medical history, past surgical history, family history and social history were all reviewed and documented in the EPIC chart. 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 80 yo daughter.   ROS:  A ROS was performed and pertinent positives and negatives are included.  Exam:  Vitals:   05/31/23 1019  BP: 126/86  Pulse: 70  SpO2: 97%  Weight: 269 lb (122 kg)  Height: 5\' 5"  (1.651 m)   Body mass index is 44.76 kg/m.  General appearance:  Normal Thyroid:  Symmetrical, normal in size, without palpable masses or nodularity. Respiratory  Auscultation:  Clear without wheezing or rhonchi Cardiovascular  Auscultation:  Regular rate, without rubs, murmurs or gallops  Edema/varicosities:  Not grossly evident Abdominal  Soft,nontender, without masses, guarding or rebound.  Liver/spleen:  No organomegaly noted  Hernia:  None appreciated  Skin  Inspection:  Grossly normal Breasts: Examined lying and sitting.   Right: Without masses, retractions, nipple discharge  or axillary adenopathy.   Left: Without masses, retractions, nipple discharge or axillary adenopathy. Pelvic: External genitalia:  no lesions              Urethra:  normal appearing urethra with no masses, tenderness or lesions              Bartholins and Skenes: normal                 Vagina: Atrophic changes, vaginal odor. No abnormal discharge              Cervix: no lesions Bimanual Exam:  Uterus:  no masses or tenderness              Adnexa: no mass, fullness, tenderness              Rectovaginal: Deferred              Anus:  normal, no lesions  Patient informed chaperone available to be present for breast and pelvic exam. Patient has requested no chaperone to be present. Patient has been advised what will be completed during breast and pelvic exam.   Wet prep + clue cells (+ odor)  Assessment/Plan:  63 y.o. G1P1 to establish care.   Well female exam with routine gynecological exam - Education provided on SBEs, importance of preventative screenings, current guidelines, high calcium diet, regular exercise, and multivitamin daily. Labs with PCP.   Postmenopausal - no HRT  Osteopenia of multiple sites - T-score -1.7 without elevated FRAX  Cervical cancer screening - Plan: Cytology - PAP( [facility]). Cervical cancer age 63, normal paps since.  Vaginal atrophy - irritation, burning, itching. Recommend hyaluronic acid or Vit E suppositories 2-3 times per week.   Vaginal odor - Plan: WET PREP FOR TRICH, YEAST, CLUE. + clue cells.   History of left breast cancer - 2017 left breast cancer managed with lumpectomy, chemo, radiation and anti-estrogen therapy (Still on Letrozole). UTD on screenings.   PMB (postmenopausal bleeding) - Plan: US PELVIS TRANSVAGINAL NON-OB (TV ONLY). Reports vaginal spotting one month ago with wiping x 1 day.  Bacterial vaginosis - Plan: metroNIDAZOLE (FLAGYL) 500 MG tablet BID a 7 days.   Screening for colon cancer - Colonoscopy around age 63. Plans to  schedule soon.   Return in about 1 year (around 05/30/2024) for Annual.    Olivia Mackie DNP, 11:05 AM 05/31/2023

## 2023-06-04 ENCOUNTER — Encounter: Payer: Self-pay | Admitting: Nurse Practitioner

## 2023-06-04 LAB — CYTOLOGY - PAP
Comment: NEGATIVE
Diagnosis: NEGATIVE
High risk HPV: NEGATIVE

## 2023-06-13 ENCOUNTER — Other Ambulatory Visit: Admitting: Nurse Practitioner

## 2023-06-13 ENCOUNTER — Other Ambulatory Visit

## 2023-06-20 ENCOUNTER — Other Ambulatory Visit: Payer: Self-pay

## 2023-06-20 ENCOUNTER — Other Ambulatory Visit (HOSPITAL_COMMUNITY): Payer: Self-pay

## 2023-06-20 ENCOUNTER — Other Ambulatory Visit: Payer: Self-pay | Admitting: Nurse Practitioner

## 2023-06-20 MED ORDER — LETROZOLE 2.5 MG PO TABS
2.5000 mg | ORAL_TABLET | Freq: Every day | ORAL | 1 refills | Status: DC
  Filled 2023-06-20: qty 90, 90d supply, fill #0

## 2023-06-21 ENCOUNTER — Other Ambulatory Visit: Payer: Self-pay

## 2023-06-21 ENCOUNTER — Other Ambulatory Visit: Payer: Self-pay | Admitting: Nurse Practitioner

## 2023-06-21 ENCOUNTER — Other Ambulatory Visit (HOSPITAL_COMMUNITY): Payer: Self-pay

## 2023-06-21 MED ORDER — LETROZOLE 2.5 MG PO TABS
2.5000 mg | ORAL_TABLET | Freq: Every day | ORAL | 0 refills | Status: DC
Start: 2023-06-21 — End: 2023-08-07
  Filled 2023-06-21 – 2023-08-07 (×2): qty 90, 90d supply, fill #0

## 2023-06-26 ENCOUNTER — Ambulatory Visit (INDEPENDENT_AMBULATORY_CARE_PROVIDER_SITE_OTHER)

## 2023-06-26 ENCOUNTER — Encounter: Payer: Self-pay | Admitting: Nurse Practitioner

## 2023-06-26 ENCOUNTER — Ambulatory Visit (INDEPENDENT_AMBULATORY_CARE_PROVIDER_SITE_OTHER): Admitting: Nurse Practitioner

## 2023-06-26 VITALS — BP 118/78 | HR 60

## 2023-06-26 DIAGNOSIS — N950 Postmenopausal bleeding: Secondary | ICD-10-CM

## 2023-06-26 DIAGNOSIS — N952 Postmenopausal atrophic vaginitis: Secondary | ICD-10-CM | POA: Diagnosis not present

## 2023-06-26 NOTE — Progress Notes (Signed)
   Acute Office Visit  Subjective:    Patient ID: Nicole Schmitt, female    DOB: [birth year], 63 y.o.   MRN: [id number]   HPI 63 y.o. presents today for ultrasound. Seen as new patient 05/31/23 to establish care. Postmenopausal. Reports vaginal spotting one month ago with wiping x 1 day. 2017 left breast cancer managed with lumpectomy, chemo, radiation and anti-estrogen therapy (Still on Letrozole ). H/O cervical cancer at age 35. Normal pap 05/31/23. Treated for BV at that time. Does have vaginal dryness and atrophy on exam. Hyaluronic acid or Vit E suppositories recommended. Has not started yet.  No LMP recorded. Patient is postmenopausal.    Review of Systems  Constitutional: Negative.   Genitourinary:        Dryness       Objective:    Physical Exam Constitutional:      Appearance: Normal appearance.   GU: Not indicated  BP 118/78 (BP Location: Left Arm, Patient Position: Sitting)   Pulse 60   SpO2 98%  Wt Readings from Last 3 Encounters:  05/31/23 269 lb (122 kg)  05/20/23 267 lb (121.1 kg)  03/06/23 267 lb (121.1 kg)        Assessment & Plan:   Problem List Items Addressed This Visit   None Visit Diagnoses       Vaginal atrophy    -  Primary      Vaginal ultrasound: Anteverted uterus, atrophic in size and shape, no myometrial masses.  Thin, symmetrical endometrium -3 mm.  No masses or thickening seen, avascular.  Both ovaries atrophic in size.  No adnexal masses, no free fluid.  Plan: Reviewed normal ultrasound. Spotting likely s/t vaginal atrophy. Had some spotting with vaginal ultrasound today. Recommend starting Hyaluronic acid or Vit E suppositories twice weekly.   Return if symptoms worsen or fail to improve.    Andee Bamberger DNP, 10:52 AM 06/26/2023

## 2023-06-27 ENCOUNTER — Other Ambulatory Visit: Payer: Self-pay | Admitting: Nurse Practitioner

## 2023-06-27 DIAGNOSIS — L989 Disorder of the skin and subcutaneous tissue, unspecified: Secondary | ICD-10-CM

## 2023-06-27 NOTE — Telephone Encounter (Signed)
 Routing to [employer]

## 2023-07-09 ENCOUNTER — Other Ambulatory Visit (HOSPITAL_COMMUNITY): Payer: Self-pay

## 2023-07-10 ENCOUNTER — Telehealth: Payer: Self-pay | Admitting: Hematology

## 2023-07-11 ENCOUNTER — Other Ambulatory Visit: Payer: Self-pay

## 2023-07-12 ENCOUNTER — Inpatient Hospital Stay: Payer: BC Managed Care – PPO

## 2023-07-12 ENCOUNTER — Inpatient Hospital Stay: Payer: BC Managed Care – PPO | Admitting: Hematology

## 2023-07-16 DIAGNOSIS — D1801 Hemangioma of skin and subcutaneous tissue: Secondary | ICD-10-CM | POA: Diagnosis not present

## 2023-07-16 DIAGNOSIS — L812 Freckles: Secondary | ICD-10-CM | POA: Diagnosis not present

## 2023-07-16 DIAGNOSIS — L570 Actinic keratosis: Secondary | ICD-10-CM | POA: Diagnosis not present

## 2023-07-16 DIAGNOSIS — L814 Other melanin hyperpigmentation: Secondary | ICD-10-CM | POA: Diagnosis not present

## 2023-07-16 DIAGNOSIS — C44519 Basal cell carcinoma of skin of other part of trunk: Secondary | ICD-10-CM | POA: Diagnosis not present

## 2023-07-16 DIAGNOSIS — L738 Other specified follicular disorders: Secondary | ICD-10-CM | POA: Diagnosis not present

## 2023-07-16 DIAGNOSIS — L821 Other seborrheic keratosis: Secondary | ICD-10-CM | POA: Diagnosis not present

## 2023-07-16 DIAGNOSIS — L820 Inflamed seborrheic keratosis: Secondary | ICD-10-CM | POA: Diagnosis not present

## 2023-07-16 DIAGNOSIS — D225 Melanocytic nevi of trunk: Secondary | ICD-10-CM | POA: Diagnosis not present

## 2023-07-20 ENCOUNTER — Other Ambulatory Visit: Payer: Self-pay

## 2023-07-20 DIAGNOSIS — C50412 Malignant neoplasm of upper-outer quadrant of left female breast: Secondary | ICD-10-CM

## 2023-07-23 NOTE — Assessment & Plan Note (Signed)
 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 Plan for 7-10 years if she tolerates   -Mammogram in 11/2022 was negative

## 2023-07-24 ENCOUNTER — Inpatient Hospital Stay: Admitting: Hematology

## 2023-07-24 ENCOUNTER — Encounter: Payer: Self-pay | Admitting: Hematology

## 2023-07-24 ENCOUNTER — Inpatient Hospital Stay: Attending: Nurse Practitioner

## 2023-07-24 VITALS — BP 130/69 | HR 62 | Temp 98.0°F | Resp 19 | Wt 269.4 lb

## 2023-07-24 DIAGNOSIS — Z79899 Other long term (current) drug therapy: Secondary | ICD-10-CM | POA: Insufficient documentation

## 2023-07-24 DIAGNOSIS — Z853 Personal history of malignant neoplasm of breast: Secondary | ICD-10-CM | POA: Insufficient documentation

## 2023-07-24 DIAGNOSIS — Z1231 Encounter for screening mammogram for malignant neoplasm of breast: Secondary | ICD-10-CM

## 2023-07-24 DIAGNOSIS — Z9221 Personal history of antineoplastic chemotherapy: Secondary | ICD-10-CM | POA: Insufficient documentation

## 2023-07-24 DIAGNOSIS — C50412 Malignant neoplasm of upper-outer quadrant of left female breast: Secondary | ICD-10-CM

## 2023-07-24 DIAGNOSIS — I480 Paroxysmal atrial fibrillation: Secondary | ICD-10-CM | POA: Diagnosis not present

## 2023-07-24 DIAGNOSIS — Z923 Personal history of irradiation: Secondary | ICD-10-CM | POA: Diagnosis not present

## 2023-07-24 DIAGNOSIS — M8580 Other specified disorders of bone density and structure, unspecified site: Secondary | ICD-10-CM | POA: Diagnosis not present

## 2023-07-24 DIAGNOSIS — Z170 Estrogen receptor positive status [ER+]: Secondary | ICD-10-CM

## 2023-07-24 LAB — CBC WITH DIFFERENTIAL (CANCER CENTER ONLY)
Abs Immature Granulocytes: 0.03 10*3/uL (ref 0.00–0.07)
Basophils Absolute: 0 10*3/uL (ref 0.0–0.1)
Basophils Relative: 0 %
Eosinophils Absolute: 0.1 10*3/uL (ref 0.0–0.5)
Eosinophils Relative: 1 %
HCT: 45.6 % (ref 36.0–46.0)
Hemoglobin: 15.2 g/dL — ABNORMAL HIGH (ref 12.0–15.0)
Immature Granulocytes: 0 %
Lymphocytes Relative: 32 %
Lymphs Abs: 3.5 10*3/uL (ref 0.7–4.0)
MCH: 28.8 pg (ref 26.0–34.0)
MCHC: 33.3 g/dL (ref 30.0–36.0)
MCV: 86.4 fL (ref 80.0–100.0)
Monocytes Absolute: 0.7 10*3/uL (ref 0.1–1.0)
Monocytes Relative: 7 %
Neutro Abs: 6.5 10*3/uL (ref 1.7–7.7)
Neutrophils Relative %: 60 %
Platelet Count: 308 10*3/uL (ref 150–400)
RBC: 5.28 MIL/uL — ABNORMAL HIGH (ref 3.87–5.11)
RDW: 13.2 % (ref 11.5–15.5)
WBC Count: 10.8 10*3/uL — ABNORMAL HIGH (ref 4.0–10.5)
nRBC: 0 % (ref 0.0–0.2)

## 2023-07-24 LAB — CMP (CANCER CENTER ONLY)
ALT: 13 U/L (ref 0–44)
AST: 13 U/L — ABNORMAL LOW (ref 15–41)
Albumin: 4.5 g/dL (ref 3.5–5.0)
Alkaline Phosphatase: 106 U/L (ref 38–126)
Anion gap: 9 (ref 5–15)
BUN: 14 mg/dL (ref 8–23)
CO2: 26 mmol/L (ref 22–32)
Calcium: 10.2 mg/dL (ref 8.9–10.3)
Chloride: 104 mmol/L (ref 98–111)
Creatinine: 0.68 mg/dL (ref 0.44–1.00)
GFR, Estimated: >60 mL/min (ref >60)
Glucose, Bld: 88 mg/dL (ref 70–99)
Potassium: 4.1 mmol/L (ref 3.5–5.1)
Sodium: 139 mmol/L (ref 135–145)
Total Bilirubin: 0.4 mg/dL (ref 0.0–1.2)
Total Protein: 8.2 g/dL — ABNORMAL HIGH (ref 6.5–8.1)

## 2023-07-24 NOTE — Progress Notes (Signed)
 [facility]   [phone] [phone]   Clinic Follow up Note   Patient Care Team: Burton, Lacie K, NP as PCP - General (Nurse Practitioner) Sonja Cartwright, MD as Consulting Physician (Hematology) Kenith Payer, MD as Consulting Physician (Radiation Oncology) Ayesha Lente, MD (Inactive) as Consulting Physician (General Surgery) Percival Brace, NP as Nurse Practitioner (Hematology and Oncology)  Date of Service:  07/24/2023  CHIEF COMPLAINT: f/u of left breast cancer  CURRENT THERAPY:  Adjuvant letrozole   Oncology History   Breast cancer of upper-outer quadrant of left female breast (HCC) invasive ductal carcinoma, grade 3, pT2N0M0, stage IIA, ER+/PR+/HER2-, (+) DCIS, 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 Plan for 7-10 years if she tolerates   -Mammogram in 11/2022 was negative  Assessment & Plan Breast cancer Breast cancer, status post-surgery with ongoing management. She has been on Letrozole  for seven years, experiencing tolerable side effects such as hot flashes and joint pain. The decision was made to discontinue Letrozole  as studies show no significant difference in outcomes between seven and ten years of treatment. She reports occasional sharp pain in the same breast, likely positional and related to nerve pain post-surgery. No new concerns or significant findings on breast examination, except for scar tissue on the lateral left breast. - Discontinue Letrozole , she has completed a total of 7 years therapy. - Order mammogram for October - Continue annual follow-up until ten years post-diagnosis  Osteopenia Osteopenia confirmed by a bone density scan earlier this year. She is attempting to take  vitamin D  and calcium supplements but is not consistent. - Encourage consistent intake of vitamin D  and calcium supplements  Paroxysmal atrial fibrillation Atrial fibrillation, likely stress-related, with her being asymptomatic most of the time. She is under the care of Dr. Avanell Bob and has undergone Zio patch testing twice, confirming the asymptomatic nature of the condition.  Plan - She is clinically doing well, will stop her letrozole  since she has completed a total of 7 years therapy - Continue cancer surveillance, follow-up with NP in 1 year - I ordered a mammogram for October 2025   SUMMARY OF ONCOLOGIC HISTORY: Oncology History Overview Note  Breast cancer of upper-outer quadrant of left female breast (HCC)   Staging form: Breast, AJCC 7th Edition   - Pathologic: Stage IIA (T2, N0, cM0) - Unsigned    Breast cancer of upper-outer quadrant of left female breast (HCC)  10/20/2015 Mammogram   Screening mammogram showed new nodular density on the left breast, measuring about 2 cm. the well defined nodules bilaterally are otherwise stable.   10/27/2015 Initial Biopsy   Left breast 2:00 position mass biopsy showed infiltrating ductal carcinoma.    10/27/2015 Initial Diagnosis   Breast cancer of upper-outer quadrant of left female breast (HCC)   10/27/2015 Receptors her2   ER 100% positive, strong staining, PR 90% positive, strong staining, HER-2 IHC 2+, FISH negative.   11/16/2015 Surgery   Left breast lumpectomy and sentinel lymph node biopsy.   11/16/2015 Pathology Results   Left breast lumpectomy showed invasive grade 3 ductal carcinoma, 2.2 cm, intermediate grade DCIS. Margins were negative. One sentinel lymph node was negative. Lymphovascular invasion was negative.   11/16/2015 Oncotype testing   RS 23, intermediate risk, which predicts 10 year risk of distant  recurrence 15% with tamoxifen   12/23/2015 - 03/01/2016 Adjuvant Chemotherapy   Docetaxel  and Cytoxan , every 3 weeks, with  Neulasta  on day 2, for 4 cycles   03/27/2016 - 05/10/2016 Radiation Therapy   Adjuvant breast radiation 03/27/16 - 05/10/16 60.4 Gy in 33 fractions to the left breast by Dr. Lorri Rota.   05/31/2016 -  Anti-estrogen oral therapy   Letrozole  2.5 mg daily       Discussed the use of AI scribe software for clinical note transcription with the patient, who gave verbal consent to proceed.  History of Present Illness Nicole Schmitt is a 63 year old female with breast cancer who presents for follow-up.  She has been on Letrozole  since April 2018 and is considering discontinuing it due to side effects, including hot flashes and joint pain. Initially, the joint pain was severe, especially in the mornings, but it has become manageable over time.  She experiences occasional sharp pain in the same breast where she had surgery, which she attributes to nerve pain. This pain is positional, occurring during activities like driving, and resolves with arm position adjustment.  A bone density scan earlier this year showed osteopenia. She is attempting to take vitamin D  and calcium supplements but is not consistent with them.  She experienced some vaginal bleeding this morning, but previous evaluations, including an ultrasound, were normal.     All other systems were reviewed with the patient and are negative.  MEDICAL HISTORY:  Past Medical History:  Diagnosis Date   Breast cancer (HCC) 2017   Left Breast Cancer   Cancer [facility]) 1990   cervical    Constipation    Heartburn    Hypertension    Joint pain    Palpitations    Personal history of chemotherapy 2017   Left Breast Cancer   Personal history of radiation therapy 2017   Left Breast Cancer   Seizures (HCC)    > 20 years following fall from horse   SOB (shortness of breath)    Swelling of both lower extremities    Vertigo     SURGICAL HISTORY: Past Surgical History:  Procedure Laterality Date   BREAST LUMPECTOMY Left 11/16/2015    Malignant   BREAST LUMPECTOMY WITH RADIOACTIVE SEED AND SENTINEL LYMPH NODE BIOPSY Left 11/16/2015   Procedure: BREAST LUMPECTOMY WITH RADIOACTIVE SEED AND SENTINEL LYMPH NODE BIOPSY;  Surgeon: Ayesha Lente, MD;  Location: [facility];  Service: General;  Laterality: Left;   CESAREAN SECTION  03/2001   COLONOSCOPY     WISDOM TOOTH EXTRACTION      I have reviewed the social history and family history with the patient and they are unchanged from previous note.  ALLERGIES:  has no known allergies.  MEDICATIONS:  Current Outpatient Medications  Medication Sig Dispense Refill   Acetaminophen  (TYLENOL  PO) Take by mouth.     hydrochlorothiazide  (HYDRODIURIL ) 25 MG tablet Take 25 mg by mouth daily. 12.5 mg to 25 mg dependent on bp medication (Patient not taking: Reported on 05/31/2023)     letrozole  (FEMARA ) 2.5 MG tablet Take 1 tablet (2.5 mg total) by mouth daily. 90 tablet 0   losartan  (COZAAR ) 25 MG tablet Take 1 tablet (25 mg total) by mouth daily. 90 tablet 3   metoprolol  succinate (TOPROL -XL) 25 MG 24 hr tablet Take 1 tablet (25 mg total) by mouth daily. 90 tablet 3   metroNIDAZOLE  (FLAGYL ) 500 MG tablet Take 1 tablet (500 mg total) by mouth 2 (two) times  daily. 14 tablet 0   promethazine -dextromethorphan (PROMETHAZINE -DM) 6.25-15 MG/5ML syrup Take 5 mLs by mouth 4 (four) times daily as needed. (Patient not taking: Reported on 06/26/2023) 100 mL 0   tretinoin  (RETIN-A ) 0.025 % cream Apply topically daily.     No current facility-administered medications for this visit.    PHYSICAL EXAMINATION: ECOG PERFORMANCE STATUS: 0 - Asymptomatic  Vitals:   07/24/23 1354  BP: 130/69  Pulse: 62  Resp: 19  Temp: 98 F (36.7 C)  SpO2: 100%   Wt Readings from Last 3 Encounters:  07/24/23 269 lb 6.4 oz (122.2 kg)  05/31/23 269 lb (122 kg)  05/20/23 267 lb (121.1 kg)     GENERAL:alert, no distress and comfortable SKIN: skin color, texture, turgor are normal, no rashes or  significant lesions EYES: normal, Conjunctiva are pink and non-injected, sclera clear NECK: supple, thyroid normal size, non-tender, without nodularity LYMPH:  no palpable lymphadenopathy in the cervical, axillary  LUNGS: clear to auscultation and percussion with normal breathing effort HEART: regular rate & rhythm and no murmurs and no lower extremity edema ABDOMEN:abdomen soft, non-tender and normal bowel sounds Musculoskeletal:no cyanosis of digits and no clubbing  NEURO: alert & oriented x 3 with fluent speech, no focal motor/sensory deficits BREAST: Right breast without masses. Left breast with scar tissue post-surgery.  No palpable axillary adenopathy Physical Exam   LABORATORY DATA:  I have reviewed the data as listed    Latest Ref Rng & Units 07/24/2023    2:39 PM 03/06/2023   12:12 PM 07/12/2022    8:59 AM  CBC  WBC 4.0 - 10.5 K/uL 10.8  8.3  8.4   Hemoglobin 12.0 - 15.0 g/dL 16.1  09.6  04.5   Hematocrit 36.0 - 46.0 % 45.6  43.7  42.8   Platelets 150 - 400 K/uL 308  289  289         Latest Ref Rng & Units 07/24/2023    2:39 PM 03/06/2023   12:12 PM 07/12/2022    8:59 AM  CMP  Glucose 70 - 99 mg/dL 88  92  98   BUN 8 - 23 mg/dL 14  14  18    Creatinine 0.44 - 1.00 mg/dL 4.09  8.11  9.14   Sodium 135 - 145 mmol/L 139  140  141   Potassium 3.5 - 5.1 mmol/L 4.1  4.4  4.0   Chloride 98 - 111 mmol/L 104  104  108   CO2 22 - 32 mmol/L 26  23  26    Calcium 8.9 - 10.3 mg/dL 78.2  9.7  9.3   Total Protein 6.5 - 8.1 g/dL 8.2   7.2   Total Bilirubin 0.0 - 1.2 mg/dL 0.4   0.6   Alkaline Phos 38 - 126 U/L 106   95   AST 15 - 41 U/L 13   13   ALT 0 - 44 U/L 13   11       RADIOGRAPHIC STUDIES: I have personally reviewed the radiological images as listed and agreed with the findings in the report. No results found.    Orders Placed This Encounter  Procedures   MM 3D SCREENING MAMMOGRAM BILATERAL BREAST    [employer] [place] Email confirmed Pf; [email or web address] No needs. No  issues. No hx of br cancer.  No implants.  No reductions. Spoke with pt/ fj    Standing Status:   Future    Expected Date:   12/17/2023    Expiration Date:  07/23/2024    Reason for Exam (SYMPTOM  OR DIAGNOSIS REQUIRED):   screening    Preferred imaging location?:   [facility]   All questions were answered. The patient knows to call the clinic with any problems, questions or concerns. No barriers to learning was detected. The total time spent in the appointment was 25 minutes, including review of chart and various tests results, discussions about plan of care and coordination of care plan     Sonja [place], MD 07/24/2023

## 2023-07-29 ENCOUNTER — Ambulatory Visit (HOSPITAL_BASED_OUTPATIENT_CLINIC_OR_DEPARTMENT_OTHER): Attending: Cardiology | Admitting: Cardiology

## 2023-07-29 VITALS — Ht 65.0 in | Wt 267.0 lb

## 2023-07-29 DIAGNOSIS — I10 Essential (primary) hypertension: Secondary | ICD-10-CM | POA: Diagnosis not present

## 2023-07-29 DIAGNOSIS — G4733 Obstructive sleep apnea (adult) (pediatric): Secondary | ICD-10-CM

## 2023-07-30 NOTE — Procedures (Signed)
   [facility] [address] [place], [place] [address] Tel: [phone]   Fax: [phone]  Titration Interpretation  Patient Name:  Nicole Schmitt, Nicole Schmitt Date:  07/29/2023 Referring Physician:  Jacqueline Matsu, MD  Indications for Polysomnography The patient is a 63 year-old Female who is 5\' 5"  and weighs 267.0 lbs. Her BMI equals 44.5.  A full night titration treatment study was performed.  Medication  No Data.   Polysomnogram Data A full night polysomnogram recorded the standard physiologic parameters including EEG, EOG, EMG, EKG, nasal and oral airflow.  Respiratory parameters of chest and abdominal movements were recorded with Respiratory Inductance Plethysmography belts.  Oxygen saturation was recorded by pulse oximetry.   Sleep Architecture The total recording time of the polysomnogram was 384.1 minutes.  The total sleep time was 302.5 minutes.  The patient spent 0.5% of total sleep time in Stage N1, 50.2% in Stage N2, 14.9% in Stages N3, and 34.4% in REM.  Sleep latency was 18.6 minutes.  REM latency was 129.0 minutes.  Sleep Efficiency was 78.7%.  Wake after Sleep Onset time was 61.5 minutes.  Titration Summary The patient was titrated at pressures ranging from 5 cm/H20 up to 14 cm/H20. The last pressure used in the study was 14 cm/H2O.  Respiratory Events The polysomnogram revealed a presence of 0 obstructive, 0 central, and 0 mixed apneas resulting in an Apnea index of 0 events per hour.  There were 24 hypopneas (>=3% desaturation and/or arousal) resulting in an Apnea\Hypopnea Index (AHI >=3% desaturation and/or arousal) of 4.8 events per hour.  There were 19 hypopneas (>=4% desaturation) resulting in an Apnea\Hypopnea Index (AHI >=4% desaturation) of 3.8 events per hour.  There were 0 Respiratory Effort Related Arousals resulting in a RERA index of 0 events per hour. The Respiratory Disturbance Index is 4.8 events per hour.  The snore  index was 0 events per hour.  Mean oxygen saturation was 95.6%.  The lowest oxygen saturation during sleep was 89.0%.  Time spent <=88% oxygen saturation was 0 minutes.  Limb Activity There were 0 limb movements recorded  Cardiac Summary The average pulse rate was 68.5 bpm.  The minimum pulse rate was 55.0 bpm while the maximum pulse rate was 91.0 bpm.  Cardiac rhythm was normal.  Diagnosis:  Obstructive Sleep Apnea Successful CPAP titration  Recommendations:  Recommend a trial of ResMed CPAP at 14cm H2O with medium Airfit F10 mask and heated humidity.  The patient should be counseled in good sleep hygiene. Encourage patient to avoid driving when sleepy. The patient should be encouraged to avoid sleeping supine. Followup in Sleep Medicine Clinic in 6 weeks.  This study was personally reviewed and electronically signed by: Jacqueline Matsu, MD Accredited Board Certified in Sleep Medicine Date/Time: 07/30/2023 12:03PM

## 2023-08-01 ENCOUNTER — Telehealth: Payer: Self-pay

## 2023-08-01 NOTE — Telephone Encounter (Signed)
-----   Message from Gaylyn Keas sent at 07/30/2023 12:04 PM EDT ----- Please let patient know that they had a successful PAP titration and let DME know that orders are in EPIC.  Please set up 6 week OV with me.

## 2023-08-01 NOTE — Telephone Encounter (Signed)
 Notified patient of sleep study results and recommendations. Patient stated she is need of DME in [place], [place]. [facility] also know as [facility] has accepted order and will process. Patient notified of DME chosen, all questions were answered and patient verbalized understanding.

## 2023-08-07 ENCOUNTER — Other Ambulatory Visit (HOSPITAL_COMMUNITY): Payer: Self-pay

## 2023-08-24 ENCOUNTER — Ambulatory Visit
Admission: RE | Admit: 2023-08-24 | Discharge: 2023-08-24 | Disposition: A | Discharge status: Home / Self Care | Source: Ambulatory Visit | Attending: Family Medicine

## 2023-08-24 VITALS — BP 146/75 | HR 82 | Temp 98.9°F | Resp 18

## 2023-08-24 DIAGNOSIS — R1084 Generalized abdominal pain: Secondary | ICD-10-CM

## 2023-08-24 LAB — POCT URINALYSIS DIP (MANUAL ENTRY)
Bilirubin, UA: NEGATIVE
Glucose, UA: NEGATIVE mg/dL
Ketones, POC UA: NEGATIVE mg/dL
Leukocytes, UA: NEGATIVE
Nitrite, UA: NEGATIVE
Protein Ur, POC: NEGATIVE mg/dL
Spec Grav, UA: >=1.03 — AB (ref 1.010–1.025)
Urobilinogen, UA: 1 U/dL
pH, UA: 5.5 (ref 5.0–8.0)

## 2023-08-24 NOTE — ED Triage Notes (Signed)
 Fever on Thursday.  Has been having lower abd pain x 2 days.  Having pressure when urinating.

## 2023-08-24 NOTE — ED Provider Notes (Signed)
 [facility]    CSN: [id number] Arrival date & time: 08/24/23  1828      History   Chief Complaint Chief Complaint  Patient presents with   Abdominal Pain    Abdominal and stomach pain that is sharp.  No nausea or vomiting. Fever 101 Thursday night, but afebrile since! - Entered by patient    HPI Nicole Schmitt is a 63 y.o. female.   Patient presenting today with 2-day history of lower abdominal discomfort, suprapubic pressure with urination and initially a fever 2 days ago.  Denies diarrhea, constipation, nausea, vomiting, upper respiratory symptoms, new foods or medications, recent sick contacts.  She does have a history of GERD but otherwise no chronic GI issues.  So far not trying anything over-the-counter for symptoms.    Past Medical History:  Diagnosis Date   Breast cancer (HCC) 2017   Left Breast Cancer   Cancer [facility]) 1990   cervical    Constipation    Heartburn    Hypertension    Joint pain    Palpitations    Personal history of chemotherapy 2017   Left Breast Cancer   Personal history of radiation therapy 2017   Left Breast Cancer   Seizures (HCC)    > 20 years following fall from horse   SOB (shortness of breath)    Swelling of both lower extremities    Vertigo     Patient Active Problem List   Diagnosis Date Noted   Chemotherapy-induced peripheral neuropathy (HCC) 01/01/2018   Lobar pneumonia, unspecified organism (HCC) 02/15/2016   Febrile neutropenia (HCC) 02/12/2016   Community acquired pneumonia of left lower lobe of lung    Cutaneous abscess of right axilla    HTN (hypertension) 01/13/2016   Breast cancer of upper-outer quadrant of left female breast (HCC) 11/23/2015    Past Surgical History:  Procedure Laterality Date   BREAST LUMPECTOMY Left 11/16/2015   Malignant   BREAST LUMPECTOMY WITH RADIOACTIVE SEED AND SENTINEL LYMPH NODE BIOPSY Left 11/16/2015   Procedure: BREAST LUMPECTOMY WITH RADIOACTIVE SEED AND SENTINEL LYMPH  NODE BIOPSY;  Surgeon: Morene Olives, MD;  Location: [facility];  Service: General;  Laterality: Left;   CESAREAN SECTION  03/2001   COLONOSCOPY     WISDOM TOOTH EXTRACTION      OB History     Gravida  1   Para  1   Term      Preterm      AB      Living  1      SAB      IAB      Ectopic      Multiple      Live Births               Home Medications    Prior to Admission medications   Medication Sig Start Date End Date Taking? Authorizing Provider  Acetaminophen  (TYLENOL  PO) Take by mouth.    [provider]  hydrochlorothiazide  (HYDRODIURIL ) 25 MG tablet Take 25 mg by mouth daily. 12.5 mg to 25 mg dependent on bp medication Patient not taking: Reported on 05/31/2023    [provider]  losartan  (COZAAR ) 25 MG tablet Take 1 tablet (25 mg total) by mouth daily. 04/06/23   Okey Vina GAILS, MD  metoprolol  succinate (TOPROL -XL) 25 MG 24 hr tablet Take 1 tablet (25 mg total) by mouth daily. 04/06/23     tretinoin  (RETIN-A ) 0.025 % cream Apply topically daily.  04/13/20   [provider]    Family History Family History  Problem Relation Age of Onset   Cancer Maternal Uncle        liver cancer/environmental exposure   Cancer Paternal Uncle        liver cancer/environmental exposure   Epilepsy Mother    Intracerebral hemorrhage Mother    Stroke Mother    Depression Mother    COPD Father    Hypertension Father    Hyperlipidemia Father    Heart disease Father    Sleep apnea Father    Breast cancer Neg Hx     Social History Social History   Tobacco Use   Smoking status: Former    Current packs/day: 0.00    Types: Cigarettes    Quit date: 1982    Years since quitting: 43.5   Smokeless tobacco: Never  Substance Use Topics   Alcohol use: Yes    Comment: rare   Drug use: No     Allergies   Patient has no known allergies.   Review of Systems Review of Systems PER HPI  Physical Exam Triage Vital Signs ED  Triage Vitals  Encounter Vitals Group     BP 08/24/23 1842 (!) 146/75     Girls Systolic BP Percentile --      Girls Diastolic BP Percentile --      Boys Systolic BP Percentile --      Boys Diastolic BP Percentile --      Pulse Rate 08/24/23 1842 82     Resp 08/24/23 1842 18     Temp 08/24/23 1842 98.9 F (37.2 C)     Temp Source 08/24/23 1842 Oral     SpO2 08/24/23 1842 95 %     Weight --      Height --      Head Circumference --      Peak Flow --      Pain Score 08/24/23 1843 4     Pain Loc --      Pain Education --      Exclude from Growth Chart --    No data found.  Updated Vital Signs BP (!) 146/75 (BP Location: Right Arm)   Pulse 82   Temp 98.9 F (37.2 C) (Oral)   Resp 18   SpO2 95%   Visual Acuity Right Eye Distance:   Left Eye Distance:   Bilateral Distance:    Right Eye Near:   Left Eye Near:    Bilateral Near:     Physical Exam Vitals and nursing note reviewed.  Constitutional:      Appearance: Normal appearance. She is not ill-appearing.  HENT:     Head: Atraumatic.     Mouth/Throat:     Mouth: Mucous membranes are moist.   Eyes:     Extraocular Movements: Extraocular movements intact.     Conjunctiva/sclera: Conjunctivae normal.    Cardiovascular:     Rate and Rhythm: Normal rate and regular rhythm.     Heart sounds: Normal heart sounds.  Pulmonary:     Effort: Pulmonary effort is normal.     Breath sounds: Normal breath sounds.  Abdominal:     General: Abdomen is flat. There is no distension.     Palpations: Abdomen is soft.     Tenderness: There is abdominal tenderness. There is no right CVA tenderness, left CVA tenderness or guarding.     Comments: Mild generalized lower abdominal tenderness to palpation without distention or  guarding.  Negative McBurney's and Rovsing's   Musculoskeletal:        General: Normal range of motion.     Cervical back: Normal range of motion and neck supple.   Skin:    General: Skin is warm and dry.    Neurological:     Mental Status: She is alert and oriented to person, place, and time.   Psychiatric:        Mood and Affect: Mood normal.        Thought Content: Thought content normal.        Judgment: Judgment normal.      UC Treatments / Results  Labs (all labs ordered are listed, but only abnormal results are displayed) Labs Reviewed  CBC WITH DIFFERENTIAL/PLATELET - Abnormal; Notable for the following components:      Result Value   WBC 10.9 (*)    Neutrophils Absolute 7.3 (*)    All other components within normal limits   Narrative:    Performed at:  [address], [place], [place]  [id number] Lab Director: Frankey Sas MD, Phone:  [phone]  LIPASE - Abnormal; Notable for the following components:   Lipase 13 (*)    All other components within normal limits   Narrative:    Performed at:  [address], [place], [place]  [id number] Lab Director: Frankey Sas MD, Phone:  [phone]  POCT URINALYSIS DIP (MANUAL ENTRY) - Abnormal; Notable for the following components:   Spec Grav, UA >=1.030 (*)    Blood, UA trace-intact (*)    All other components within normal limits  BASIC METABOLIC PANEL WITH GFR   Narrative:    Performed at:  [address] [employer] [address], [place], [place]  [id number] Lab Director: Frankey Sas MD, Phone:  [phone]    EKG   Radiology No results found.  Procedures Procedures (including critical care time)  Medications Ordered in UC Medications - No data to display  Initial Impression / Assessment and Plan / UC Course  I have reviewed the triage vital signs and the nursing notes.  Pertinent labs & imaging results that were available during my care of the patient were reviewed by me and considered in my medical decision making (see chart for details).     Very minimally hypertensive in triage, otherwise vital signs within normal limits.  She is well-appearing in no  acute distress with no red flag findings on exam.  Urinalysis with trace intact blood, otherwise within normal limits.  Labs pending for further evaluation.  Suspect viral etiology, discussed supportive over-the-counter medications, home care and return precautions.  Final Clinical Impressions(s) / UC Diagnoses   Final diagnoses:  Generalized abdominal pain     Discharge Instructions      Your vitals and exam are reassuring.  Your urinalysis was negative for a urinary tract infection.  We will let you know if any of your labs come back abnormal.  Eat bland foods, drink plenty of water, get lots of rest and go to the emergency department if your symptoms severely worsen    ED Prescriptions   None    PDMP not reviewed this encounter.   Stuart Vernell Norris, [place] 08/25/23 [phone]

## 2023-08-24 NOTE — Discharge Instructions (Signed)
 Your vitals and exam are reassuring.  Your urinalysis was negative for a urinary tract infection.  We will let you know if any of your labs come back abnormal.  Eat bland foods, drink plenty of water, get lots of rest and go to the emergency department if your symptoms severely worsen

## 2023-08-25 LAB — CBC WITH DIFFERENTIAL/PLATELET
Basophils Absolute: 0 10*3/uL (ref 0.0–0.2)
Basos: 0 %
EOS (ABSOLUTE): 0.1 10*3/uL (ref 0.0–0.4)
Eos: 1 %
Hematocrit: 43.2 % (ref 34.0–46.6)
Hemoglobin: 14.4 g/dL (ref 11.1–15.9)
Immature Grans (Abs): 0 10*3/uL (ref 0.0–0.1)
Immature Granulocytes: 0 %
Lymphocytes Absolute: 2.7 10*3/uL (ref 0.7–3.1)
Lymphs: 25 %
MCH: 29.6 pg (ref 26.6–33.0)
MCHC: 33.3 g/dL (ref 31.5–35.7)
MCV: 89 fL (ref 79–97)
Monocytes Absolute: 0.8 10*3/uL (ref 0.1–0.9)
Monocytes: 7 %
Neutrophils Absolute: 7.3 10*3/uL — ABNORMAL HIGH (ref 1.4–7.0)
Neutrophils: 67 %
Platelets: 300 10*3/uL (ref 150–450)
RBC: 4.86 x10E6/uL (ref 3.77–5.28)
RDW: 12.7 % (ref 11.7–15.4)
WBC: 10.9 10*3/uL — ABNORMAL HIGH (ref 3.4–10.8)

## 2023-08-25 LAB — BASIC METABOLIC PANEL WITH GFR
BUN/Creatinine Ratio: 19 (ref 12–28)
BUN: 13 mg/dL (ref 8–27)
CO2: 21 mmol/L (ref 20–29)
Calcium: 9.4 mg/dL (ref 8.7–10.3)
Chloride: 102 mmol/L (ref 96–106)
Creatinine, Ser: 0.69 mg/dL (ref 0.57–1.00)
Glucose: 90 mg/dL (ref 70–99)
Potassium: 3.9 mmol/L (ref 3.5–5.2)
Sodium: 141 mmol/L (ref 134–144)
eGFR: 99 mL/min/{1.73_m2} (ref >59)

## 2023-08-25 LAB — LIPASE: Lipase: 13 U/L — ABNORMAL LOW (ref 14–72)

## 2023-08-27 ENCOUNTER — Ambulatory Visit (HOSPITAL_COMMUNITY): Payer: Self-pay

## 2023-09-12 NOTE — Progress Notes (Unsigned)
 Cardiology Office Note   Date:  09/14/2023  ID:  Nicole Schmitt, DOB 08/16/1960, MRN [id number] PCP: Burton, Lacie K, NP  [facility] Providers Cardiologist:  None {  History of Present Illness Nicole Schmitt is a 63 y.o. female with a past medical history of hypertension and DOE here for follow-up appointment.  She also reported an episode of atrial fibrillation in March of last year on her Apple Watch.  She had a stress test done in June 2023 for DOE which was normal.  Her longest episode of atrial fibrillation was about 4 hours and she has about 1 episode a month per her Apple Watch.  She does not know when she is in atrial fibrillation.  She gets very short of breath and feels her heart in her upper chest.  Patient has to usually sit up during these times.  When she is not having the spells she feels good.  She denied chest pain, dizziness, SOB.  She works as a Press photographer at [employer].  Today, she presents for follow-up of atrial fibrillation and sleep apnea management.  She is on metoprolol  for atrial fibrillation and did not qualify for anticoagulation therapy. A Zio patch showed a low burden of atrial fibrillation with two episodes since April. Current medications include metoprolol , Cozaar , and HCTZ.  Moderate sleep apnea was diagnosed in January using a wrist device. Further testing at a sleep center in June has not led to follow-up or CPAP therapy setup. She denies current use of CPAP.  LDL cholesterol was 123 mg/dL in January. She manages cholesterol through diet and exercise, consuming grilled foods and salads but not exercising regularly. A high-protein diet was attempted but not maintained.  She experiences occasional foot swelling after prolonged work and driving, resolving with elevation. She works as a Engineer, civil (consulting) with a long commute  Discussed the use of AI scribe software for clinical note transcription with the patient, who gave verbal consent to  proceed.  Reports no shortness of breath nor dyspnea on exertion. Reports no chest pain, pressure, or tightness. No  orthopnea, PND. Reports no palpitations.   ROS: Pertinent ROS in HPI  Studies Reviewed     Zio 03/12/23   Patch Wear Time:  13 days and 21 hours (2025-01-16T19:52:55-0500 to 2025-01-30T17:06:15-0500)   Impression:   Sinus rhythm  Rate 45 to 123 bpm   Average HR 77 bpm    Rare PACs, PVCs Pt also had episodes of atrial fibrillation  (2% burden)  Rates 88 to 195 bpm   Average HR 138 bpm   Longest episode of afib lasted 17 min 11 seconds.   Diary entries/triggered events correlated to SR and atrial fibrillation    Not all of episodes of atrial fibrillation lead to triggered events  iny were present. Risk Assessment/Calculations  CHA2DS2-VASc Score = 2   This indicates a 2.2% annual risk of stroke. The patient's score is based upon: CHF History: 0 HTN History: 1 Diabetes History: 0 Stroke History: 0 Vascular Disease History: 0 Age Score: 0 Gender Score: 1     Physical Exam VS:  BP 127/79 (BP Location: Left Arm, Patient Position: Sitting)   Pulse 67   Ht 5' 5 (1.651 m)   Wt 270 lb 12.8 oz (122.8 kg)   SpO2 98%   BMI 45.06 kg/m        Wt Readings from Last 3 Encounters:  09/14/23 270 lb 12.8 oz (122.8 kg)  07/29/23 267 lb (121.1 kg)  07/24/23 269 lb 6.4 oz (122.2 kg)    GEN: Well nourished, well developed in no acute distress NECK: No JVD; No carotid bruits CARDIAC: RRR, no murmurs, rubs, gallops RESPIRATORY:  Clear to auscultation without rales, wheezing or rhonchi  ABDOMEN: Soft, non-tender, non-distended EXTREMITIES:  No edema; No deformity   ASSESSMENT AND PLAN PAF ( started March of last year), CHA2DS2-VASc score of 2, not started on anticoagulation Chronic AFib with low stroke risk and infrequent episodes. Managed on metoprolol , no anticoagulation needed. - Continue metoprolol  for rate control.  Hypertension Blood pressure well-controlled on  HCTZ and Cozaar . - Refill HCTZ and Cozaar  prescriptions.  Hyperlipidemia Elevated LDL at 123 mg/dL, target below 899 mg/dL. Prefers lifestyle modifications over medication. - Order lipid panel today as she is fasting. - Discuss dietary modifications and exercise to lower LDL. - Consider low-dose statin if LDL remains elevated.  Obstructive Sleep Apnea (OSA) Moderate OSA, not using CPAP due to lack of follow-up. Weight loss suggested. Zepbound may help reduce symptoms. - Contact Nina from Dr. Dorine office for sleep study results and CPAP setup. - Investigate insurance coverage for Zepbound for weight loss and OSA improvement.      Dispo: She can follow-up in 6 months with Dr. Okey  Signed, Orren LOISE Fabry, PA-C

## 2023-09-14 ENCOUNTER — Other Ambulatory Visit (HOSPITAL_COMMUNITY): Payer: Self-pay

## 2023-09-14 ENCOUNTER — Ambulatory Visit: Attending: Physician Assistant | Admitting: Physician Assistant

## 2023-09-14 ENCOUNTER — Encounter: Payer: Self-pay | Admitting: Physician Assistant

## 2023-09-14 ENCOUNTER — Telehealth: Payer: Self-pay

## 2023-09-14 VITALS — BP 127/79 | HR 67 | Ht 65.0 in | Wt 270.8 lb

## 2023-09-14 DIAGNOSIS — I4891 Unspecified atrial fibrillation: Secondary | ICD-10-CM

## 2023-09-14 DIAGNOSIS — E782 Mixed hyperlipidemia: Secondary | ICD-10-CM | POA: Diagnosis not present

## 2023-09-14 DIAGNOSIS — G4733 Obstructive sleep apnea (adult) (pediatric): Secondary | ICD-10-CM | POA: Diagnosis not present

## 2023-09-14 DIAGNOSIS — I10 Essential (primary) hypertension: Secondary | ICD-10-CM

## 2023-09-14 MED ORDER — HYDROCHLOROTHIAZIDE 25 MG PO TABS
12.5000 mg | ORAL_TABLET | Freq: Every day | ORAL | 3 refills | Status: AC
  Filled 2023-09-14: qty 90, 90d supply, fill #0
  Filled 2023-12-13: qty 90, 90d supply, fill #1
  Filled 2024-03-12: qty 90, 90d supply, fill #2

## 2023-09-14 MED ORDER — LOSARTAN POTASSIUM 25 MG PO TABS
25.0000 mg | ORAL_TABLET | Freq: Every day | ORAL | 3 refills | Status: AC
  Filled 2023-09-14 – 2023-10-29 (×2): qty 90, 90d supply, fill #0
  Filled 2024-01-27: qty 90, 90d supply, fill #1

## 2023-09-14 MED ORDER — METOPROLOL SUCCINATE ER 25 MG PO TB24
25.0000 mg | ORAL_TABLET | Freq: Every day | ORAL | 3 refills | Status: AC
  Filled 2023-09-14 – 2023-10-29 (×2): qty 90, 90d supply, fill #0
  Filled 2024-01-27: qty 90, 90d supply, fill #1

## 2023-09-14 NOTE — Patient Instructions (Addendum)
 Medication Instructions:  Your refills have been sent to your pharmacy.  *If you need a refill on your cardiac medications before your next appointment, please call your pharmacy*   Lab Work: Labs will be drawn today...............SABRA LIPID STUDY  If you have labs (blood work) drawn today and your tests are completely normal, you will receive your results only by: MyChart Message (if you have MyChart) OR A paper copy in the mail If you have any lab test that is abnormal or we need to change your treatment, we will call you to review the results.   Testing/Procedures: No procedures were ordered during today's visit.    Follow-Up: At [facility], you and your health needs are our priority.  As part of our continuing mission to provide you with exceptional heart care, we have created designated Provider Care Teams.  These Care Teams include your primary Cardiologist (physician) and Advanced Practice Providers (APPs -  Physician Assistants and Nurse Practitioners) who all work together to provide you with the care you need, when you need it.  We recommend signing up for the patient portal called MyChart.  Sign up information is provided on this After Visit Summary.  MyChart is used to connect with patients for Virtual Visits (Telemedicine).  Patients are able to view lab/test results, encounter notes, upcoming appointments, etc.  Non-urgent messages can be sent to your provider as well.   To learn more about what you can do with MyChart, go to [email or web address].    Your next appointment:   6 month(s)  Provider:   Dr. Vina Gull     Other Instructions Thank you for choosing [facility]!

## 2023-09-14 NOTE — Telephone Encounter (Signed)
-----   Message from Orren LOISE Fabry sent at 09/14/2023 11:32 AM EDT ----- This patient tells me that zepbound is now approved for OSA patient's.  She is asking if we can submit a PA and prescribe and what her co-pay would be.  Can you help me with this?

## 2023-09-14 NOTE — Telephone Encounter (Signed)
 Pharmacy Patient Advocate Encounter   Received notification from Physician's Office that prior authorization for ZEPBOUND is required/requested.   Insurance verification completed.   The patient is insured through [employer] [employer] .   Per test claim: PA required; PA submitted to above mentioned insurance via CoverMyMeds Key/confirmation #/EOC [id number] Status is pending

## 2023-09-15 LAB — LIPID PANEL
Chol/HDL Ratio: 4.6 ratio — ABNORMAL HIGH (ref 0.0–4.4)
Cholesterol, Total: 203 mg/dL — ABNORMAL HIGH (ref 100–199)
HDL: 44 mg/dL (ref >39)
LDL Chol Calc (NIH): 135 mg/dL — ABNORMAL HIGH (ref 0–99)
Triglycerides: 136 mg/dL (ref 0–149)
VLDL Cholesterol Cal: 24 mg/dL (ref 5–40)

## 2023-09-17 ENCOUNTER — Ambulatory Visit: Payer: Self-pay | Admitting: *Deleted

## 2023-09-17 DIAGNOSIS — Z79899 Other long term (current) drug therapy: Secondary | ICD-10-CM

## 2023-09-17 DIAGNOSIS — E782 Mixed hyperlipidemia: Secondary | ICD-10-CM

## 2023-09-17 NOTE — Telephone Encounter (Signed)
 Patient made aware of Zepbound denial

## 2023-09-17 NOTE — Telephone Encounter (Signed)
 Pharmacy Patient Advocate Encounter  Received notification from [facility] that Prior Authorization for ZEPBOUND has been CANCELLED due to DRUG NOT COVERED UNDER PHARMACY BENEFIT. PA NOT AVAILABLE.

## 2023-09-20 ENCOUNTER — Encounter: Payer: Self-pay | Admitting: Hematology

## 2023-09-20 ENCOUNTER — Telehealth: Payer: Self-pay | Admitting: Pharmacy Technician

## 2023-09-20 ENCOUNTER — Other Ambulatory Visit (HOSPITAL_COMMUNITY): Payer: Self-pay

## 2023-09-20 MED ORDER — EZETIMIBE 10 MG PO TABS
10.0000 mg | ORAL_TABLET | Freq: Every day | ORAL | 1 refills | Status: DC
  Filled 2023-09-20: qty 90, 90d supply, fill #0
  Filled 2023-12-13: qty 90, 90d supply, fill #1

## 2023-09-20 NOTE — Telephone Encounter (Signed)
 The patient has been notified of the result and verbalized understanding.  All questions (if any) were answered.  Pt aware to start taking zetia  10 mg po daily and come in for repeat lipids at any [facility], in 8 weeks.   Confirmed the pharmacy of choice with the pt.  Pt states she will get her repeat lipids in 8 weeks done at [facility].  She is aware this will be around the 3rd week in Sept and she will need to go fasting to this lab appt.   Pt verbalized understanding and agrees with this plan.  Will mail the pt her lab requisition form to her confirmed mailing address on file.

## 2023-09-20 NOTE — Telephone Encounter (Signed)
-----   Message from Orren LOISE Fabry sent at 09/20/2023  8:32 AM EDT ----- We can try Zetia  10mg  daily and recheck lipids in 8 weeks. Might need Nexlizet but will probably need a PA.   Thanks! Orren LOISE Fabry, PA-C ----- Message ----- From: Gladis Porter HERO, LPN Sent: [id number]/75/7974   8:30 AM EDT To: Orren LOISE Fabry, PA-C  Please advise ----- Message ----- From: [name] Sent: 09/20/2023   7:47 AM EDT To: [name], LPN  Nicole Schmitt,   Your LDL or bad cholesterol remains high. I think its time to consider a cholesterol medication to help lower your risk of heart attack and stroke. We can explore statin or non-statin option. Let me  know which interests you.  Thanks! Orren LOISE Fabry, PA-C  ----- Message ----- From: Rebecka Memos Lab Results In Sent: 09/15/2023  12:35 AM EDT To: Orren LOISE Fabry, PA-C

## 2023-09-20 NOTE — Telephone Encounter (Signed)
 From rx advice request

## 2023-09-24 ENCOUNTER — Encounter: Payer: Self-pay | Admitting: Pharmacist

## 2023-09-24 DIAGNOSIS — D045 Carcinoma in situ of skin of trunk: Secondary | ICD-10-CM | POA: Diagnosis not present

## 2023-09-25 DIAGNOSIS — G4733 Obstructive sleep apnea (adult) (pediatric): Secondary | ICD-10-CM | POA: Diagnosis not present

## 2023-09-26 ENCOUNTER — Other Ambulatory Visit (HOSPITAL_COMMUNITY): Payer: Self-pay

## 2023-10-01 ENCOUNTER — Ambulatory Visit: Admitting: Dermatology

## 2023-10-10 NOTE — Telephone Encounter (Signed)
 Upon patient request DME selection is [facility]. [place] [facility] also know as [facility]  Patient understands he will be contacted by [facility] to set up his cpap. Patient understands to call if [facility] does not contact him with new setup in a timely manner. Patient understands they will be called once confirmation has been received from [facility] that they have received their new machine to schedule 10 week follow up appointment.   [facility] notified of new cpap order  Please add to airview Patient was grateful for the call and thanked me.

## 2023-10-26 DIAGNOSIS — G4733 Obstructive sleep apnea (adult) (pediatric): Secondary | ICD-10-CM | POA: Diagnosis not present

## 2023-10-26 NOTE — Telephone Encounter (Signed)
 Per scanned set-up confirmation from [facility], CPAP setup completed on 09/25/23. Patient is due for a compliance visit with Dr. Shlomo between 10/26/23-12/24/23. Routed to scheduler for assistance.

## 2023-10-29 ENCOUNTER — Other Ambulatory Visit: Payer: Self-pay

## 2023-10-30 ENCOUNTER — Other Ambulatory Visit (HOSPITAL_COMMUNITY): Payer: Self-pay

## 2023-11-09 ENCOUNTER — Telehealth: Payer: Self-pay

## 2023-11-09 NOTE — Telephone Encounter (Signed)
 Nicole Schmitt

## 2023-11-12 ENCOUNTER — Ambulatory Visit: Attending: Cardiology | Admitting: Cardiology

## 2023-11-12 VITALS — BP 138/70 | HR 64 | Ht 65.0 in | Wt 268.0 lb

## 2023-11-12 DIAGNOSIS — Z6841 Body Mass Index (BMI) 40.0 and over, adult: Secondary | ICD-10-CM

## 2023-11-12 DIAGNOSIS — I10 Essential (primary) hypertension: Secondary | ICD-10-CM | POA: Diagnosis not present

## 2023-11-12 DIAGNOSIS — G4733 Obstructive sleep apnea (adult) (pediatric): Secondary | ICD-10-CM

## 2023-11-12 NOTE — Progress Notes (Signed)
 SLEEP MEDICINE VIRTUAL CONSULT NOTE via Video Note   Because of Nicole Schmitt's co-morbid illnesses, she is at least at moderate risk for complications without adequate follow up.  This format is felt to be most appropriate for this patient at this time.  All issues noted in this document were discussed and addressed.  A limited physical exam was performed with this format.  Please refer to the patient's chart for her consent to telehealth for [facility].      Date:  11/12/2023   ID:  Nicole Schmitt, DOB 07-19-1960, MRN [id number] The patient was identified using 2 identifiers.  Patient Location: Home Provider Location: Home Office   PCP:  Burton, Lacie K, NP   [facility] Providers Cardiologist:  None     Evaluation Performed:  New Patient Evaluation  Chief Complaint:  OSA  History of Present Illness:    Nicole Schmitt is a 63 y.o. female who is being seen today for the evaluation of obstructive sleep apnea at the request of Vina Gull, MD.  Nicole Schmitt is a 63 y.o. female with with a history of breast cancer, PAF, hypertension, seizure disorder and shortness of breath.  She tells me that a year ago she started having problems with afib and felt it was stress related.  She was placed on BB with improvement.  Her last episode of afib was in April.  She was seen by Dr. Gull and told her that she does not sleep well due to her work schedule.  She really did not have any significant daytime sleepiness and did not nap during the day.  Due to the PAF she ordered a home sleep study which showed moderate obstructive sleep apnea with an AHI of 14.6/h overall but during REM sleep was severe with a REM AHI of 36.6/h.  She also had evidence of nocturnal hypoxemia with O2 saturations less than 88% for 7.5 minutes with a nadir O2 sat of 79%.  She underwent CPAP titration to 14 cm H2O.  She is now referred for sleep medicine consultation for treatment of  obstructive sleep apnea  She has really been struggling with the CPAP.  She has tried multiple different masks.  She is a stomach and side sleeper. She has problems falling asleep with it on. She was changed to a nasal cushion mask which burns her nostrils.  She feels the pressure is adequate.  She thinks her sleep is much worse than it is without the device.   Past Medical History:  Diagnosis Date   Breast cancer (HCC) 2017   Left Breast Cancer   Cancer [facility]) 1990   cervical    Constipation    Heartburn    Hypertension    Joint pain    Palpitations    Personal history of chemotherapy 2017   Left Breast Cancer   Personal history of radiation therapy 2017   Left Breast Cancer   Seizures (HCC)    > 20 years following fall from horse   SOB (shortness of breath)    Swelling of both lower extremities    Vertigo    Past Surgical History:  Procedure Laterality Date   BREAST LUMPECTOMY Left 11/16/2015   Malignant   BREAST LUMPECTOMY WITH RADIOACTIVE SEED AND SENTINEL LYMPH NODE BIOPSY Left 11/16/2015   Procedure: BREAST LUMPECTOMY WITH RADIOACTIVE SEED AND SENTINEL LYMPH NODE BIOPSY;  Surgeon: Morene Olives, MD;  Location: [facility];  Service:  General;  Laterality: Left;   CESAREAN SECTION  03/2001   COLONOSCOPY     WISDOM TOOTH EXTRACTION       Current Meds  Medication Sig   Acetaminophen  (TYLENOL  PO) Take by mouth.   ezetimibe  (ZETIA ) 10 MG tablet Take 1 tablet (10 mg total) by mouth daily.   hydrochlorothiazide  (HYDRODIURIL ) 25 MG tablet Take 0.5-1 tablets (12.5-25 mg total) by mouth daily for blood pressure.   losartan  (COZAAR ) 25 MG tablet Take 1 tablet (25 mg total) by mouth daily.   metoprolol  succinate (TOPROL -XL) 25 MG 24 hr tablet Take 1 tablet (25 mg total) by mouth daily.   tretinoin  (RETIN-A ) 0.025 % cream Apply topically daily.     Allergies:   Patient has no known allergies.   Social History   Tobacco Use   Smoking status: Former     Current packs/day: 0.00    Types: Cigarettes    Quit date: 1982    Years since quitting: 43.7   Smokeless tobacco: Never  Substance Use Topics   Alcohol use: Yes    Comment: rare   Drug use: No     Family Hx: The patient's family history includes COPD in her father; Cancer in her maternal uncle and paternal uncle; Depression in her mother; Epilepsy in her mother; Heart disease in her father; Hyperlipidemia in her father; Hypertension in her father; Intracerebral hemorrhage in her mother; Sleep apnea in her father; Stroke in her mother. There is no history of Breast cancer.  ROS:   Please see the history of present illness.     All other systems reviewed and are negative.   Prior Sleep studies:   The following studies were reviewed today:  Home sleep study, CPAP titration, PAP compliance download  Labs/Other Tests and Data Reviewed:     Recent Labs: 03/06/2023: TSH 3.180 07/24/2023: ALT 13 08/24/2023: BUN 13; Creatinine, Ser 0.69; Hemoglobin 14.4; Platelets 300; Potassium 3.9; Sodium 141    Wt Readings from Last 3 Encounters:  11/12/23 268 lb (121.6 kg)  09/14/23 270 lb 12.8 oz (122.8 kg)  07/29/23 267 lb (121.1 kg)     Risk Assessment/Calculations:      STOP-Bang Score:  5      Objective:    Vital Signs:  BP 138/70   Pulse 64   Ht 5' 5 (1.651 m)   Wt 268 lb (121.6 kg)   BMI 44.60 kg/m    VITAL SIGNS:  reviewed GEN:  no acute distress EYES:  sclerae anicteric, EOMI - Extraocular Movements Intact RESPIRATORY:  normal respiratory effort, symmetric expansion CARDIOVASCULAR:  no peripheral edema SKIN:  no rash, lesions or ulcers. MUSCULOSKELETAL:  no obvious deformities. NEURO:  alert and oriented x 3, no obvious focal deficit PSYCH:  normal affect   ASSESSMENT & PLAN:    OSA - The PAP download performed by his DME was personally reviewed and interpreted by me today and showed an AHI of 1.6/hr on 14 cm H2O with 87% compliance in using more than 4 hours  nightly.   -The patient has been using and benefiting from PAP use but is really struggling with her device and sleeping poorly -we discussed different treatment options including the oral device which is cost prohibitive and the Inspire device (she does not qualify at this time due to AHI<15/hr and BMI>35) -I will call in an order for a nasal pillow mask -she is going to try to lose weight and if she can get her BMI down to  qualify for [facility] will ultimately be referred for Inspire  Morbid Obesity -her BMI is 44 and we need to get it <35 -she is going to start seeing a trainer -weight loss meds are cost prohibitive  Hypertension - BP controlled on exam today - Continue HCTZ 25 mg daily, losartan  25 mg daily, Toprol  XL 25 mg daily with as needed refills   Time:   Today, I have spent 20 minutes with the patient with telehealth technology discussing the above problems.     Medication Adjustments/Labs and Tests Ordered: Current medicines are reviewed at length with the patient today.  Concerns regarding medicines are outlined above.   Tests Ordered: No orders of the defined types were placed in this encounter.   Medication Changes: No orders of the defined types were placed in this encounter.   Follow Up:  In Person in 1 year(s)  Signed, Wilbert Bihari, MD  11/12/2023 7:56 AM

## 2023-11-12 NOTE — Patient Instructions (Signed)
 Medication Instructions:  Your physician recommends that you continue on your current medications as directed. Please refer to the Current Medication list given to you today.  *If you need a refill on your cardiac medications before your next appointment, please call your pharmacy*  Lab Work: None.  If you have labs (blood work) drawn today and your tests are completely normal, you will receive your results only by: MyChart Message (if you have MyChart) OR A paper copy in the mail If you have any lab test that is abnormal or we need to change your treatment, we will call you to review the results.  Testing/Procedures: None.  Follow-Up: At [facility], you and your health needs are our priority.  As part of our continuing mission to provide you with exceptional heart care, our providers are all part of one team.  This team includes your primary Cardiologist (physician) and Advanced Practice Providers or APPs (Physician Assistants and Nurse Practitioners) who all work together to provide you with the care you need, when you need it.  Your next appointment:   6 month(s)  Provider:   Dr. Wilbert Bihari, MD

## 2023-11-22 DIAGNOSIS — Z79899 Other long term (current) drug therapy: Secondary | ICD-10-CM | POA: Diagnosis not present

## 2023-11-22 DIAGNOSIS — E782 Mixed hyperlipidemia: Secondary | ICD-10-CM | POA: Diagnosis not present

## 2023-11-22 LAB — LIPID PANEL
Chol/HDL Ratio: 4 ratio (ref 0.0–4.4)
Cholesterol, Total: 164 mg/dL (ref 100–199)
HDL: 41 mg/dL (ref >39)
LDL Chol Calc (NIH): 103 mg/dL — ABNORMAL HIGH (ref 0–99)
Triglycerides: 107 mg/dL (ref 0–149)
VLDL Cholesterol Cal: 20 mg/dL (ref 5–40)

## 2023-11-26 DIAGNOSIS — G4733 Obstructive sleep apnea (adult) (pediatric): Secondary | ICD-10-CM | POA: Diagnosis not present

## 2023-11-28 ENCOUNTER — Other Ambulatory Visit: Payer: Self-pay | Admitting: Medical Genetics

## 2023-12-05 ENCOUNTER — Ambulatory Visit
Admission: RE | Admit: 2023-12-05 | Discharge: 2023-12-05 | Disposition: A | Source: Ambulatory Visit | Attending: Hematology

## 2023-12-05 DIAGNOSIS — Z1231 Encounter for screening mammogram for malignant neoplasm of breast: Secondary | ICD-10-CM

## 2023-12-21 ENCOUNTER — Encounter: Payer: Self-pay | Admitting: Nurse Practitioner

## 2023-12-23 ENCOUNTER — Emergency Department (HOSPITAL_COMMUNITY)
Admission: EM | Admit: 2023-12-23 | Discharge: 2023-12-23 | Disposition: A | Discharge status: Home / Self Care | Attending: Emergency Medicine | Admitting: Emergency Medicine

## 2023-12-23 ENCOUNTER — Other Ambulatory Visit: Payer: Self-pay

## 2023-12-23 ENCOUNTER — Encounter (HOSPITAL_COMMUNITY): Payer: Self-pay

## 2023-12-23 ENCOUNTER — Emergency Department (HOSPITAL_COMMUNITY)

## 2023-12-23 DIAGNOSIS — H9311 Tinnitus, right ear: Secondary | ICD-10-CM | POA: Insufficient documentation

## 2023-12-23 DIAGNOSIS — R42 Dizziness and giddiness: Secondary | ICD-10-CM | POA: Diagnosis not present

## 2023-12-23 DIAGNOSIS — I10 Essential (primary) hypertension: Secondary | ICD-10-CM | POA: Insufficient documentation

## 2023-12-23 DIAGNOSIS — Z79899 Other long term (current) drug therapy: Secondary | ICD-10-CM | POA: Insufficient documentation

## 2023-12-23 DIAGNOSIS — Z853 Personal history of malignant neoplasm of breast: Secondary | ICD-10-CM | POA: Diagnosis not present

## 2023-12-23 LAB — PROTIME-INR
INR: 0.9 (ref 0.8–1.2)
Prothrombin Time: 13 s (ref 11.4–15.2)

## 2023-12-23 LAB — COMPREHENSIVE METABOLIC PANEL WITH GFR
ALT: 12 U/L (ref 0–44)
AST: 20 U/L (ref 15–41)
Albumin: 4.3 g/dL (ref 3.5–5.0)
Alkaline Phosphatase: 133 U/L — ABNORMAL HIGH (ref 38–126)
Anion gap: 13 (ref 5–15)
BUN: 16 mg/dL (ref 8–23)
CO2: 24 mmol/L (ref 22–32)
Calcium: 9.6 mg/dL (ref 8.9–10.3)
Chloride: 105 mmol/L (ref 98–111)
Creatinine, Ser: 0.81 mg/dL (ref 0.44–1.00)
GFR, Estimated: >60 mL/min (ref >60)
Glucose, Bld: 111 mg/dL — ABNORMAL HIGH (ref 70–99)
Potassium: 4.1 mmol/L (ref 3.5–5.1)
Sodium: 141 mmol/L (ref 135–145)
Total Bilirubin: 0.3 mg/dL (ref 0.0–1.2)
Total Protein: 7.6 g/dL (ref 6.5–8.1)

## 2023-12-23 LAB — DIFFERENTIAL
Abs Immature Granulocytes: 0.03 K/uL (ref 0.00–0.07)
Basophils Absolute: 0 K/uL (ref 0.0–0.1)
Basophils Relative: 0 %
Eosinophils Absolute: 0.1 K/uL (ref 0.0–0.5)
Eosinophils Relative: 1 %
Immature Granulocytes: 0 %
Lymphocytes Relative: 29 %
Lymphs Abs: 3.3 K/uL (ref 0.7–4.0)
Monocytes Absolute: 0.7 K/uL (ref 0.1–1.0)
Monocytes Relative: 6 %
Neutro Abs: 7.2 K/uL (ref 1.7–7.7)
Neutrophils Relative %: 64 %

## 2023-12-23 LAB — CBC
HCT: 46.7 % — ABNORMAL HIGH (ref 36.0–46.0)
Hemoglobin: 15 g/dL (ref 12.0–15.0)
MCH: 28.8 pg (ref 26.0–34.0)
MCHC: 32.1 g/dL (ref 30.0–36.0)
MCV: 89.8 fL (ref 80.0–100.0)
Platelets: 313 K/uL (ref 150–400)
RBC: 5.2 MIL/uL — ABNORMAL HIGH (ref 3.87–5.11)
RDW: 13.5 % (ref 11.5–15.5)
WBC: 11.4 K/uL — ABNORMAL HIGH (ref 4.0–10.5)
nRBC: 0 % (ref 0.0–0.2)

## 2023-12-23 LAB — ETHANOL: Alcohol, Ethyl (B): <15 mg/dL (ref <15)

## 2023-12-23 LAB — APTT: aPTT: 25 s (ref 24–36)

## 2023-12-23 LAB — CBG MONITORING, ED: Glucose-Capillary: 107 mg/dL — ABNORMAL HIGH (ref 70–99)

## 2023-12-23 MED ORDER — DIAZEPAM 2 MG PO TABS
2.0000 mg | ORAL_TABLET | Freq: Once | ORAL | Status: AC
  Administered 2023-12-23: 2 mg via ORAL
  Filled 2023-12-23: qty 1

## 2023-12-23 MED ORDER — SODIUM CHLORIDE 0.9% FLUSH: 3.0000 mL | Freq: Once | INTRAVENOUS | Status: DC

## 2023-12-23 NOTE — ED Provider Notes (Signed)
 [facility]PART[facility] AT [facility] Provider Note   CSN: [id number] Arrival date & time: 12/23/23  2044     Patient presents with: Dizziness   Nicole Schmitt is a 63 y.o. female.   Patient is a 63 year old female with previous history of breast cancer with chemoradiation, benign positional vertigo, and hypertension presenting for complaints of dizziness.  Patient states she began having dizziness with head movements on 12/18/2023 approximately 6 days ago.  She states her symptoms fluctuated and had mild improvement with meclizine.  She states she had an old dose of Valium that she attempted, that she was given with her previous vertigo diagnosis after failed treatment with meclizine.  She states she took a dose but it was approximately 61 years old and did not improve her symptoms.  She denies any sensation or motor deficits.  She denies confusion, dysarthria, dysphagia, or any vision changes.  She does admit to tinnitus that is worse on the right as compared to the left.  She states with her previous vertigo and tinnitus she received a CT head but no MRI imaging.  Again symptoms are worse with head movements but still mildly present while lying still.  She began having a significant headache that prompted her visit to the emergency department today.  The history is provided by the patient. No language interpreter was used.  Dizziness Associated symptoms: tinnitus   Associated symptoms: no chest pain, no palpitations, no shortness of breath and no vomiting        Prior to Admission medications   Medication Sig Start Date End Date Taking? Authorizing Provider  Acetaminophen  (TYLENOL  PO) Take by mouth.    [provider]  ezetimibe  (ZETIA ) 10 MG tablet Take 1 tablet (10 mg total) by mouth daily. 09/20/23   Lucien Orren SAILOR, PA-C  hydrochlorothiazide  (HYDRODIURIL ) 25 MG tablet Take 0.5-1 tablets (12.5-25 mg total) by mouth daily for blood pressure. 09/14/23    Lucien Orren SAILOR, PA-C  losartan  (COZAAR ) 25 MG tablet Take 1 tablet (25 mg total) by mouth daily. 09/14/23   Lucien Orren SAILOR, PA-C  metoprolol  succinate (TOPROL -XL) 25 MG 24 hr tablet Take 1 tablet (25 mg total) by mouth daily. 09/14/23   Conte, Tessa N, PA-C  tretinoin  (RETIN-A ) 0.025 % cream Apply topically daily. 04/13/20   [provider]    Allergies: Patient has no known allergies.    Review of Systems  Constitutional:  Negative for chills and fever.  HENT:  Positive for tinnitus. Negative for ear pain and sore throat.   Eyes:  Negative for pain and visual disturbance.  Respiratory:  Negative for cough and shortness of breath.   Cardiovascular:  Negative for chest pain and palpitations.  Gastrointestinal:  Negative for abdominal pain and vomiting.  Genitourinary:  Negative for dysuria and hematuria.  Musculoskeletal:  Negative for arthralgias and back pain.  Skin:  Negative for color change and rash.  Neurological:  Positive for dizziness. Negative for seizures and syncope.  All other systems reviewed and are negative.   Updated Vital Signs BP (!) 111/42   Pulse 66   Temp 98.5 F (36.9 C) (Oral)   Resp 17   Wt 121.6 kg   SpO2 95%   BMI 44.60 kg/m   Physical Exam Vitals and nursing note reviewed.  Constitutional:      General: She is not in acute distress.    Appearance: She is well-developed.  HENT:     Head: Normocephalic and atraumatic.  Eyes:     General: Lids are normal. Vision grossly intact.     Extraocular Movements: Extraocular movements intact.     Right eye: No nystagmus.     Left eye: No nystagmus.     Conjunctiva/sclera: Conjunctivae normal.  Cardiovascular:     Rate and Rhythm: Normal rate and regular rhythm.     Heart sounds: No murmur heard. Pulmonary:     Effort: Pulmonary effort is normal. No respiratory distress.     Breath sounds: Normal breath sounds.  Abdominal:     Palpations: Abdomen is soft.     Tenderness: There is no abdominal  tenderness.  Musculoskeletal:        General: No swelling.     Cervical back: Neck supple.  Skin:    General: Skin is warm and dry.     Capillary Refill: Capillary refill takes less than 2 seconds.  Neurological:     General: No focal deficit present.     Mental Status: She is alert and oriented to person, place, and time.     GCS: GCS eye subscore is 4. GCS verbal subscore is 5. GCS motor subscore is 6.     Cranial Nerves: Cranial nerves 2-12 are intact.     Sensory: Sensation is intact.     Motor: Motor function is intact.     Coordination: Coordination is intact.  Psychiatric:        Mood and Affect: Mood normal.     (all labs ordered are listed, but only abnormal results are displayed) Labs Reviewed  CBC - Abnormal; Notable for the following components:      Result Value   WBC 11.4 (*)    RBC 5.20 (*)    HCT 46.7 (*)    All other components within normal limits  COMPREHENSIVE METABOLIC PANEL WITH GFR - Abnormal; Notable for the following components:   Glucose, Bld 111 (*)    Alkaline Phosphatase 133 (*)    All other components within normal limits  CBG MONITORING, ED - Abnormal; Notable for the following components:   Glucose-Capillary 107 (*)    All other components within normal limits  PROTIME-INR  APTT  DIFFERENTIAL  ETHANOL    EKG: None  Radiology: CT HEAD WO CONTRAST Result Date: 12/23/2023 EXAM: CT HEAD WITHOUT CONTRAST 12/23/2023 09:25:08 PM TECHNIQUE: CT of the head was performed without the administration of intravenous contrast. Automated exposure control, iterative reconstruction, and/or weight based adjustment of the mA/kV was utilized to reduce the radiation dose to as low as reasonably achievable. COMPARISON: None available. CLINICAL HISTORY: Headache, neuro deficit. c/o dizziness. Per pt, she had one episode of on Tuesday night that was relieved with meclizine. While pt pt was standing and cooking dinner tonight she started to have another episode of  dizziness. That episode started around 19:30. Pt reports ; headache. Pt denies focal deficits, slurred speech, or vision changes. Pt does have hx of vertigo and a.fib FINDINGS: BRAIN AND VENTRICLES: No acute hemorrhage. No evidence of acute infarct. No hydrocephalus. No extra-axial collection. No mass effect or midline shift. ORBITS: No acute abnormality. SINUSES: No acute abnormality. SOFT TISSUES AND SKULL: No acute soft tissue abnormality. No skull fracture. IMPRESSION: 1. No acute intracranial abnormality. Electronically signed by: Pinkie Pebbles MD 12/23/2023 09:27 PM EDT RP Workstation: [id number]     Procedures   Medications Ordered in the ED  sodium chloride  flush (NS) 0.9 % injection 3 mL (3 mLs Intravenous Not Given 12/23/23 2121)  diazepam (  VALIUM) tablet 2 mg (2 mg Oral Given 12/23/23 2236)                                    Medical Decision Making Amount and/or Complexity of Data Reviewed Labs: ordered. Radiology: ordered.  Risk Prescription drug management.    63 year old female with previous history of breast cancer with chemoradiation, benign positional vertigo, and hypertension presenting for complaints of dizziness.  Patient is alert and oriented x 3, no acute distress, afebrile, stable vital signs.  No neurovascular deficits found on exam.  No nystagmus.  CT head demonstrates no acute process.  MRI brain with and without recommended due to tinnitus and dizziness with no prior imaging and history of breast cancer.  No MRI present tonight.  Will order outpatient and follow-up on results with patient.  Significant improvement after Valium in the ED.  Will discharge home and follow-up on her MRI.  Patient in no distress and overall condition improved here in the ED. Detailed discussions were had with the patient regarding current findings, and need for close f/u with PCP or on call doctor. The patient has been instructed to return immediately if the symptoms worsen in any  way for re-evaluation. Patient verbalized understanding and is in agreement with current care plan. All questions answered prior to discharge.      Final diagnoses:  Vertigo  Tinnitus of right ear    ED Discharge Orders          Ordered    MR BRAIN W WO CONTRAST        12/23/23 2344               Elnor Hila P, DO 12/23/23 2345

## 2023-12-23 NOTE — ED Notes (Signed)
 Patient transported to CT

## 2023-12-23 NOTE — ED Triage Notes (Signed)
 Pt arrives with c/o dizziness. Per pt, she had one episode of on Tuesday night that was relieved with meclizine. While pt pt was standing and cooking dinner tonight she started to have another episode of dizziness. That episode started around 19:30. Pt reports headache. Pt denies focal deficits, slurred speech, or vision changes. Pt does have hx of vertigo and a.fib.

## 2023-12-24 ENCOUNTER — Encounter (HOSPITAL_COMMUNITY): Payer: Self-pay | Admitting: Emergency Medicine

## 2023-12-24 ENCOUNTER — Ambulatory Visit (HOSPITAL_COMMUNITY)
Admission: RE | Admit: 2023-12-24 | Discharge: 2023-12-24 | Disposition: A | Discharge status: Home / Self Care | Source: Ambulatory Visit | Attending: Emergency Medicine | Admitting: Emergency Medicine

## 2023-12-24 ENCOUNTER — Encounter: Payer: Self-pay | Admitting: Nurse Practitioner

## 2023-12-24 ENCOUNTER — Other Ambulatory Visit (HOSPITAL_COMMUNITY)

## 2023-12-24 ENCOUNTER — Other Ambulatory Visit (HOSPITAL_COMMUNITY)
Admission: RE | Admit: 2023-12-24 | Discharge: 2023-12-24 | Disposition: A | Source: Ambulatory Visit | Attending: Medical Genetics | Admitting: Medical Genetics

## 2023-12-24 DIAGNOSIS — H9319 Tinnitus, unspecified ear: Secondary | ICD-10-CM | POA: Insufficient documentation

## 2023-12-24 DIAGNOSIS — Z853 Personal history of malignant neoplasm of breast: Secondary | ICD-10-CM | POA: Insufficient documentation

## 2023-12-24 DIAGNOSIS — R42 Dizziness and giddiness: Secondary | ICD-10-CM | POA: Insufficient documentation

## 2023-12-24 MED ORDER — GADOBUTROL 1 MMOL/ML IV SOLN
10.0000 mL | Freq: Once | INTRAVENOUS | Status: AC | PRN
  Administered 2023-12-24: 10 mL via INTRAVENOUS

## 2023-12-26 ENCOUNTER — Encounter (INDEPENDENT_AMBULATORY_CARE_PROVIDER_SITE_OTHER): Payer: Self-pay | Admitting: *Deleted

## 2023-12-26 ENCOUNTER — Other Ambulatory Visit: Payer: Self-pay

## 2023-12-26 ENCOUNTER — Telehealth (HOSPITAL_COMMUNITY): Payer: Self-pay | Admitting: Emergency Medicine

## 2023-12-26 ENCOUNTER — Other Ambulatory Visit: Payer: Self-pay | Admitting: Nurse Practitioner

## 2023-12-26 ENCOUNTER — Other Ambulatory Visit (HOSPITAL_COMMUNITY): Payer: Self-pay

## 2023-12-26 ENCOUNTER — Encounter (HOSPITAL_COMMUNITY): Payer: Self-pay | Admitting: Emergency Medicine

## 2023-12-26 DIAGNOSIS — Z1211 Encounter for screening for malignant neoplasm of colon: Secondary | ICD-10-CM

## 2023-12-26 MED ORDER — DIAZEPAM 5 MG PO TABS
5.0000 mg | ORAL_TABLET | Freq: Two times a day (BID) | ORAL | 0 refills | Status: AC | PRN
  Filled 2023-12-26: qty 10, 5d supply, fill #0

## 2023-12-26 NOTE — Telephone Encounter (Signed)
 Private patient message encounter 12/25/23: Outpatient MRI brain w w/o or vertigo and tinnitus with pmh of breast cancer results-no acute process. Proceed with ENT referral for symptoms. Prescription for valium for vertigo that is refractory to meclizine sent to preferred pharmacy.

## 2023-12-28 ENCOUNTER — Encounter (INDEPENDENT_AMBULATORY_CARE_PROVIDER_SITE_OTHER): Payer: Self-pay

## 2023-12-31 ENCOUNTER — Other Ambulatory Visit: Payer: Self-pay

## 2023-12-31 DIAGNOSIS — C50412 Malignant neoplasm of upper-outer quadrant of left female breast: Secondary | ICD-10-CM

## 2024-01-04 ENCOUNTER — Other Ambulatory Visit: Payer: Self-pay

## 2024-01-05 LAB — GENECONNECT MOLECULAR SCREEN: Genetic Analysis Overall Interpretation: NEGATIVE

## 2024-01-26 DIAGNOSIS — G4733 Obstructive sleep apnea (adult) (pediatric): Secondary | ICD-10-CM | POA: Diagnosis not present

## 2024-02-18 ENCOUNTER — Ambulatory Visit (INDEPENDENT_AMBULATORY_CARE_PROVIDER_SITE_OTHER)

## 2024-02-18 ENCOUNTER — Encounter (INDEPENDENT_AMBULATORY_CARE_PROVIDER_SITE_OTHER): Payer: Self-pay

## 2024-02-18 VITALS — BP 114/77 | HR 58 | Temp 97.8°F

## 2024-02-18 DIAGNOSIS — H918X1 Other specified hearing loss, right ear: Secondary | ICD-10-CM | POA: Diagnosis not present

## 2024-02-18 DIAGNOSIS — H9311 Tinnitus, right ear: Secondary | ICD-10-CM | POA: Diagnosis not present

## 2024-02-18 DIAGNOSIS — R42 Dizziness and giddiness: Secondary | ICD-10-CM | POA: Diagnosis not present

## 2024-02-18 DIAGNOSIS — H9191 Unspecified hearing loss, right ear: Secondary | ICD-10-CM

## 2024-02-18 DIAGNOSIS — H918X3 Other specified hearing loss, bilateral: Secondary | ICD-10-CM

## 2024-02-18 DIAGNOSIS — H9319 Tinnitus, unspecified ear: Secondary | ICD-10-CM

## 2024-02-18 NOTE — Progress Notes (Signed)
 Dear Dr. Ann, Here is my assessment for our mutual patient, Nicole Schmitt. Thank you for allowing me the opportunity to care for your patient. Please do not hesitate to contact me should you have any other questions. Sincerely, Dr. Hadassah Parody  Otolaryngology Clinic Note Referring provider: Dr. Ann HPI:   Initial HPI (02/18/2024) Discussed the use of AI scribe software for clinical note transcription with the patient, who gave verbal consent to proceed.  History of Present Illness Nicole Schmitt is a 63 year old female with chronic right-sided hearing loss and tinnitus who presents for evaluation of asymmetrical hearing loss and recent prolonged vertigo.  Right-sided hearing loss and tinnitus - Chronic right-sided hearing loss present for years, causing difficulty hearing - Persistent tinnitus described as 'whooshing, ringing' in quality, intermittently bothersome - No prior otologic surgery, trauma, or recurrent ear infections - No significant noise exposure to the right ear - Family history of significant hearing loss in her father, requiring hearing aids  Vertigo and dizziness - Recent episode of vertigo lasting approximately six weeks, onset with turning to the left in bed - Vertigo more pronounced on the left side, associated with significant nausea - Severity of symptoms led to missing two days of work - Persistent symptoms required activity modification, including squatting and keeping head elevated to retrieve items - Emergency care sought; stroke excluded - Neurology consultation recommended frequent Epley maneuvers, which induced marked vertigo and nausea but ultimately resulted in improvement - Prior episode of vertigo approximately one year ago, lasting a few weeks - Continues to experience brief, non-sustained dizziness with rapid bending over, but no persistent vertigo  Oropharyngeal symptoms - Mild sore throat present on the morning of the visit,  attributed to environmental factors  Migraine history - No history of migraine except for rare hormonally related episodes prior to menopause    Independent Review of Additional Tests or Records:  Referral 12/26/23 Bernarda Pereyra, DO: persistent vertigo, refer to ENT    PMH/Meds/All/SocHx/FamHx/ROS:   Past Medical History:  Diagnosis Date   Breast cancer (HCC) 2017   Left Breast Cancer   Cancer (HCC) 1990   cervical    Constipation    Heartburn    Hypertension    Joint pain    Palpitations    Personal history of chemotherapy 2017   Left Breast Cancer   Personal history of radiation therapy 2017   Left Breast Cancer   Seizures (HCC)    > 20 years following fall from horse   SOB (shortness of breath)    Swelling of both lower extremities    Vertigo      Past Surgical History:  Procedure Laterality Date   BREAST LUMPECTOMY Left 11/16/2015   Malignant   BREAST LUMPECTOMY WITH RADIOACTIVE SEED AND SENTINEL LYMPH NODE BIOPSY Left 11/16/2015   Procedure: BREAST LUMPECTOMY WITH RADIOACTIVE SEED AND SENTINEL LYMPH NODE BIOPSY;  Surgeon: Morene Olives, MD;  Location: [facility];  Service: General;  Laterality: Left;   CESAREAN SECTION  03/2001   COLONOSCOPY     WISDOM TOOTH EXTRACTION      Family History  Problem Relation Age of Onset   Cancer Maternal Uncle        liver cancer/environmental exposure   Cancer Paternal Uncle        liver cancer/environmental exposure   Epilepsy Mother    Intracerebral hemorrhage Mother    Stroke Mother    Depression Mother    COPD Father    Hypertension  Father    Hyperlipidemia Father    Heart disease Father    Sleep apnea Father    Breast cancer Neg Hx      Social Connections: Not on file     Current Outpatient Medications  Medication Instructions   Acetaminophen  (TYLENOL  PO) Take by mouth.   diazepam  (VALIUM ) 5 mg, Oral, Every 12 hours PRN   ezetimibe  (ZETIA ) 10 mg, Oral, Daily   hydrochlorothiazide   (HYDRODIURIL ) 25 MG tablet Take 0.5-1 tablets (12.5-25 mg total) by mouth daily for blood pressure.   losartan  (COZAAR ) 25 mg, Oral, Daily   metoprolol  succinate (TOPROL -XL) 25 mg, Oral, Daily   tretinoin (RETIN-A) 0.025 % cream Daily     Physical Exam:   BP 114/77 (BP Location: Right Arm, Patient Position: Sitting)   Pulse (!) 58   Temp 97.8 F (36.6 C)   SpO2 95%   Salient findings:  CN II-XII intact Bilateral EAC clear and TM intact with well pneumatized middle ear spaces No obviously palpable neck masses/lymphadenopathy/thyromegaly No respiratory distress or stridor  Seprately Identifiable Procedures:  Prior to initiating any procedures, risks/benefits/alternatives were explained to the patient and verbal consent obtained.  Procedure (02/18/2024): Bilateral ear microscopy using microscope (CPT [id number]) Pre-procedure diagnosis: right hearing loss  Post-procedure diagnosis: same Indication: see above; given patient's otologic complaints and history, for improved and comprehensive examination of external ear and tympanic membrane, bilateral otologic examination using microscope was performed. Prior to proceeding, verbal consent was obtained after discussion of R/B/A  Procedure: Patient was placed semi-recumbent. Both ear canals were examined using the microscope with findings above. Patient tolerated the procedure well.   Impression & Plans:  Nicole Schmitt is a 63 y.o. female with   1. Asymmetrical hearing loss   2. Hearing loss of right ear, unspecified hearing loss type   3. Tinnitus, unspecified laterality   4. 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 Hearing aids may be beneficial for both hearing loss and tinnitus if sensorineural or age-related  loss is confirmed. - Ordered audiometric evaluation to determine degree and type of hearing loss and further assess tinnitus. - Discussed hearing aids as a potential treatment for both hearing loss and tinnitus, contingent on audiometric findings.  Vertigo She experiences recurrent episodes of vertigo consistent with benign paroxysmal positional vertigo based on symptomatology but the timeline is too long for BPPV if it was truly constant for 6 weeks. Migraine is a possibility in this case. Her symptoms have mostly resolved at this point.   - Offered referral to vestibular rehabilitation for BPPV management, including professional Epley maneuver, if symptoms recur. - Advised her to contact the office for vestibular rehabilitation referral should vertigo recur.  See below regarding exact medications prescribed this encounter including dosages and route: No orders of the defined types were placed in this encounter.   Thank you for allowing me the opportunity to care for your patient. Please do not hesitate to contact me should you have any other questions.  Sincerely, Hadassah Parody, MD Otolaryngologist (ENT), [facility] Phone: [phone] Fax: [phone]  MDM:  Level 3 Complexity/Problems addressed: 4- chronic worsening problem  Data complexity: 3- independent review of referral note, ordered audio  - Morbidity: low  - Prescription Drug prescribed or managed: no

## 2024-02-25 DIAGNOSIS — G4733 Obstructive sleep apnea (adult) (pediatric): Secondary | ICD-10-CM | POA: Diagnosis not present

## 2024-03-12 ENCOUNTER — Other Ambulatory Visit: Payer: Self-pay

## 2024-03-12 ENCOUNTER — Other Ambulatory Visit: Payer: Self-pay | Admitting: Physician Assistant

## 2024-03-12 DIAGNOSIS — E782 Mixed hyperlipidemia: Secondary | ICD-10-CM

## 2024-03-12 DIAGNOSIS — Z79899 Other long term (current) drug therapy: Secondary | ICD-10-CM

## 2024-03-12 MED ORDER — EZETIMIBE 10 MG PO TABS
10.0000 mg | ORAL_TABLET | Freq: Every day | ORAL | 1 refills | Status: AC
  Filled 2024-03-12: qty 90, 90d supply, fill #0

## 2024-03-19 ENCOUNTER — Ambulatory Visit (INDEPENDENT_AMBULATORY_CARE_PROVIDER_SITE_OTHER)

## 2024-03-19 ENCOUNTER — Ambulatory Visit
Admission: RE | Admit: 2024-03-19 | Discharge: 2024-03-19 | Disposition: A | Discharge status: Home / Self Care | Source: Ambulatory Visit | Attending: Nurse Practitioner | Admitting: Nurse Practitioner

## 2024-03-19 VITALS — BP 146/89 | HR 85 | Temp 99.1°F | Resp 16

## 2024-03-19 DIAGNOSIS — J209 Acute bronchitis, unspecified: Secondary | ICD-10-CM | POA: Diagnosis not present

## 2024-03-19 DIAGNOSIS — R059 Cough, unspecified: Secondary | ICD-10-CM

## 2024-03-19 LAB — POCT RAPID STREP A (OFFICE): Rapid Strep A Screen: NEGATIVE

## 2024-03-19 LAB — POCT RESPIRATORY SYNCYTIAL VIRUS: RSV Rapid Ag: NEGATIVE

## 2024-03-19 MED ORDER — PREDNISONE 20 MG PO TABS: 40.0000 mg | ORAL_TABLET | Freq: Every day | ORAL | 0 refills | Status: AC

## 2024-03-19 MED ORDER — METHYLPREDNISOLONE SODIUM SUCC 125 MG IJ SOLR
125.0000 mg | Freq: Once | INTRAMUSCULAR | Status: AC
  Administered 2024-03-19: 125 mg via INTRAMUSCULAR

## 2024-03-19 MED ORDER — AZITHROMYCIN 250 MG PO TABS: 250.0000 mg | ORAL_TABLET | Freq: Every day | ORAL | 0 refills | Status: AC

## 2024-03-19 MED ORDER — PROMETHAZINE-DM 6.25-15 MG/5ML PO SYRP: 5.0000 mL | ORAL_SOLUTION | Freq: Four times a day (QID) | ORAL | 0 refills | Status: AC | PRN

## 2024-03-19 NOTE — ED Triage Notes (Signed)
 Pt reports cough started before christmas and was intermittent,2 days ago cough became consistent, has recently had exposure to RSV and strep/. Now has some SOB. Pt has done home COVID and FLU A/B testing all were negative. Has been taking OTC meds has found no relief.

## 2024-03-19 NOTE — ED Provider Notes (Signed)
 " [facility]    CSN: [id number] Arrival date & time: 03/19/24  1131      History   Chief Complaint Chief Complaint  Patient presents with   Cough    Worsening cough with low grade fever 100.5.  Took Covid, flu A & B home tests and all negative - Entered by patient    HPI Nicole Schmitt is a 64 y.o. female.   The history is provided by the patient.   Patient presents for cough that has been present for the past 3 to 4 weeks.  She states over the past several days, she has noticed a gradual worsening of the cough.  States that she did have a fever last evening around 100.5.  States I feel awful.  Denies headache, ear pain, wheezing, difficulty breathing, abdominal pain, nausea, vomiting, diarrhea, or rash.  Patient also endorses chest discomfort with the cough.  Denies history of smoking or asthma, per review of her chart, she does have a history of shortness of breath and pneumonia.  Patient states that she has been taking over-the-counter medications for her symptoms with minimal relief.  Patient states that she was around family recently diagnosed with RSV and strep.  Patient states that she took a home COVID/flu test which was negative.  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   Lobar pneumonia, unspecified organism 02/15/2016   Febrile neutropenia 02/12/2016   Community acquired pneumonia of left lower lobe of lung    Cutaneous  abscess of right axilla    HTN (hypertension) 01/13/2016   Breast cancer of upper-outer quadrant of left female breast (HCC) 11/23/2015    Past Surgical History:  Procedure Laterality Date   BREAST LUMPECTOMY Left 11/16/2015   Malignant   BREAST LUMPECTOMY WITH RADIOACTIVE SEED AND SENTINEL LYMPH NODE BIOPSY Left 11/16/2015   Procedure: BREAST LUMPECTOMY WITH RADIOACTIVE SEED AND SENTINEL LYMPH NODE BIOPSY;  Surgeon: Morene Olives, MD;  Location: [facility];  Service: General;  Laterality: Left;   CESAREAN SECTION  03/2001   COLONOSCOPY     WISDOM TOOTH EXTRACTION      OB History     Gravida  1   Para  1   Term      Preterm      AB      Living  1      SAB      IAB      Ectopic      Multiple      Live Births               Home Medications    Prior to Admission medications  Medication Sig Start Date End Date Taking? Authorizing Provider  azithromycin  (ZITHROMAX ) 250 MG tablet Take 1 tablet (250 mg total) by mouth daily. Take first  2 tablets together, then 1 every day until finished. 03/19/24  Yes Leath-Warren, Etta PARAS, NP  predniSONE  (DELTASONE ) 20 MG tablet Take 2 tablets (40 mg total) by mouth daily with breakfast for 5 days. 03/19/24 03/24/24 Yes Leath-Warren, Etta PARAS, NP  promethazine -dextromethorphan (PROMETHAZINE -DM) 6.25-15 MG/5ML syrup Take 5 mLs by mouth 4 (four) times daily as needed. 03/19/24  Yes Leath-Warren, Etta PARAS, NP  Acetaminophen  (TYLENOL  PO) Take by mouth.    [provider]  diazepam  (VALIUM ) 5 MG tablet Take 1 tablet (5 mg total) by mouth every 12 (twelve) hours as needed for up to 10 doses (dizziness). 12/26/23   Elnor Hila P, DO  ezetimibe  (ZETIA ) 10 MG tablet Take 1 tablet (10 mg total) by mouth daily. 03/12/24   Lucien Orren SAILOR, PA-C  hydrochlorothiazide  (HYDRODIURIL ) 25 MG tablet Take 0.5-1 tablets (12.5-25 mg total) by mouth daily for blood pressure. Patient taking differently: Take 12.5-25 mg by  mouth daily. Taking as needed 09/14/23   Lucien Orren SAILOR, PA-C  losartan  (COZAAR ) 25 MG tablet Take 1 tablet (25 mg total) by mouth daily. 09/14/23   Lucien Orren SAILOR, PA-C  metoprolol  succinate (TOPROL -XL) 25 MG 24 hr tablet Take 1 tablet (25 mg total) by mouth daily. 09/14/23   Lucien Orren SAILOR, PA-C  tretinoin (RETIN-A) 0.025 % cream Apply topically daily. Patient taking differently: Apply topically daily. As needed 04/13/20   [provider]    Family History Family History  Problem Relation Age of Onset   Cancer Maternal Uncle        liver cancer/environmental exposure   Cancer Paternal Uncle        liver cancer/environmental exposure   Epilepsy Mother    Intracerebral hemorrhage Mother    Stroke Mother    Depression Mother    COPD Father    Hypertension Father    Hyperlipidemia Father    Heart disease Father    Sleep apnea Father    Breast cancer Neg Hx     Social History Social History[1]   Allergies   Patient has no known allergies.   Review of Systems Review of Systems Per HPI  Physical Exam Triage Vital Signs ED Triage Vitals  Encounter Vitals Group     BP 03/19/24 1151 (!) 146/89     Girls Systolic BP Percentile --      Girls Diastolic BP Percentile --      Boys Systolic BP Percentile --      Boys Diastolic BP Percentile --      Pulse Rate 03/19/24 1151 85     Resp 03/19/24 1151 16     Temp 03/19/24 1151 99.1 F (37.3 C)     Temp Source 03/19/24 1151 Oral     SpO2 03/19/24 1151 95 %     Weight --      Height --      Head Circumference --      Peak Flow --      Pain Score 03/19/24 1153 0     Pain Loc --      Pain Education --      Exclude from Growth Chart --    No data found.  Updated Vital Signs BP (!) 146/89 (BP Location: Right Arm)   Pulse 85   Temp 99.1 F (37.3 C) (Oral)   Resp 16   SpO2 95%   Visual Acuity Right Eye Distance:   Left Eye Distance:   Bilateral Distance:    Right Eye Near:  Left Eye Near:    Bilateral  Near:     Physical Exam Vitals and nursing note reviewed.  Constitutional:      General: She is not in acute distress.    Appearance: Normal appearance.  HENT:     Head: Normocephalic.     Right Ear: Tympanic membrane, ear canal and external ear normal.     Left Ear: Tympanic membrane, ear canal and external ear normal.     Nose: Congestion present.     Mouth/Throat:     Mouth: Mucous membranes are moist.  Eyes:     Extraocular Movements: Extraocular movements intact.     Conjunctiva/sclera: Conjunctivae normal.     Pupils: Pupils are equal, round, and reactive to light.  Cardiovascular:     Rate and Rhythm: Normal rate and regular rhythm.     Pulses: Normal pulses.     Heart sounds: Normal heart sounds.  Pulmonary:     Effort: Pulmonary effort is normal. No respiratory distress.     Breath sounds: No stridor. Rhonchi present. No wheezing or rales.  Musculoskeletal:     Cervical back: Normal range of motion.  Skin:    General: Skin is warm and dry.  Neurological:     General: No focal deficit present.     Mental Status: She is alert and oriented to person, place, and time.  Psychiatric:        Mood and Affect: Mood normal.        Behavior: Behavior normal.      UC Treatments / Results  Labs (all labs ordered are listed, but only abnormal results are displayed) Labs Reviewed  POCT RAPID STREP A (OFFICE) - Normal  POCT RESPIRATORY SYNCYTIAL VIRUS    EKG   Radiology DG Chest 2 View Result Date: 03/19/2024 CLINICAL DATA:  Persistent cough, shortness of breath EXAM: CHEST - 2 VIEW COMPARISON:  February 12, 2016. FINDINGS: The heart size and mediastinal contours are within normal limits. Both lungs are clear. The visualized skeletal structures are unremarkable. IMPRESSION: No active cardiopulmonary disease. Electronically Signed   By: Lynwood Landy Raddle M.D.   On: 03/19/2024 12:24    Procedures Procedures (including critical care time)  Medications Ordered in  UC Medications  methylPREDNISolone  sodium succinate (SOLU-MEDROL ) 125 mg/2 mL injection 125 mg (has no administration in time range)    Initial Impression / Assessment and Plan / UC Course  I have reviewed the triage vital signs and the nursing notes.  Pertinent labs & imaging results that were available during my care of the patient were reviewed by me and considered in my medical decision making (see chart for details).  Patient presents for complaints of cough has been present for approximately 1 month.  On initial exam, patient did have rhonchi in the posterior right lower lobe, room air sats are at 95%.  She also reports exposure to RSV and strep throat.  RSV and rapid strep test were negative.  Chest x-ray was negative for active cardiopulmonary disease.  Symptoms are consistent with acute bronchitis.  Will cover for bacterial etiology with azithromycin  250 mg.  Patient was given Solu-Medrol  125 mg in the office today.  Will also have her continue prednisone  40 mg over the next 5 days.  Symptomatic treatment provided with Promethazine  DM for the cough.  Supportive care recommendations were provided and discussed with the patient to include fluids, rest, over-the-counter analgesics, and use of a humidifier during sleep.  Discussed indications with the patient  regarding follow-up.  Patient was in agreement with this plan of care and verbalizes understanding.  All questions were answered.  Patient stable for discharge.  prFinal Clinical Impressions(s) / UC Diagnoses   Final diagnoses:  Cough, unspecified type  Acute bronchitis, unspecified organism     Discharge Instructions      The rapid strep and RSV test were negative.  Your chest x-ray was also negative for pneumonia. You were given an injection of Solu-Medrol  125 mg.  Start the prednisone  tomorrow. You may take over-the-counter Tylenol  as needed for pain, fever, or general discomfort. Increase fluids and allow for plenty of  rest. Recommend the use of a humidifier in your bedroom at nighttime during sleep and sleeping elevated on pillows while symptoms persist. Your cough may continue to last from days to weeks, even after completing the medication.  If you are generally feeling well, but continued to have a persistent nagging cough, continue over-the-counter cough medications, fluids, and cough drops.  Seek care if you develop fever, wheezing, shortness of breath, or difficulty breathing. Follow-up as needed.     ED Prescriptions     Medication Sig Dispense Auth. Provider   predniSONE  (DELTASONE ) 20 MG tablet Take 2 tablets (40 mg total) by mouth daily with breakfast for 5 days. 10 tablet Leath-Warren, Etta PARAS, NP   promethazine -dextromethorphan (PROMETHAZINE -DM) 6.25-15 MG/5ML syrup Take 5 mLs by mouth 4 (four) times daily as needed. 118 mL Leath-Warren, Etta PARAS, NP   azithromycin  (ZITHROMAX ) 250 MG tablet Take 1 tablet (250 mg total) by mouth daily. Take first 2 tablets together, then 1 every day until finished. 6 tablet Leath-Warren, Etta PARAS, NP      PDMP not reviewed this encounter.    [1]  Social History Tobacco Use   Smoking status: Former    Current packs/day: 0.00    Average packs/day: 0.5 packs/day    Types: Cigarettes    Quit date: 1982    Years since quitting: 44.0   Smokeless tobacco: Never  Substance Use Topics   Alcohol use: Yes    Comment: rare   Drug use: No     Gilmer Etta PARAS, NP 03/19/24 1242  "

## 2024-03-19 NOTE — Discharge Instructions (Signed)
 The rapid strep and RSV test were negative.  Your chest x-ray was also negative for pneumonia. You were given an injection of Solu-Medrol  125 mg.  Start the prednisone  tomorrow. You may take over-the-counter Tylenol  as needed for pain, fever, or general discomfort. Increase fluids and allow for plenty of rest. Recommend the use of a humidifier in your bedroom at nighttime during sleep and sleeping elevated on pillows while symptoms persist. Your cough may continue to last from days to weeks, even after completing the medication.  If you are generally feeling well, but continued to have a persistent nagging cough, continue over-the-counter cough medications, fluids, and cough drops.  Seek care if you develop fever, wheezing, shortness of breath, or difficulty breathing. Follow-up as needed.

## 2024-03-31 ENCOUNTER — Ambulatory Visit (INDEPENDENT_AMBULATORY_CARE_PROVIDER_SITE_OTHER): Admitting: Audiology

## 2024-03-31 ENCOUNTER — Ambulatory Visit (INDEPENDENT_AMBULATORY_CARE_PROVIDER_SITE_OTHER)

## 2024-04-10 ENCOUNTER — Ambulatory Visit: Admitting: Family Medicine

## 2024-04-10 ENCOUNTER — Ambulatory Visit (INDEPENDENT_AMBULATORY_CARE_PROVIDER_SITE_OTHER)

## 2024-04-10 ENCOUNTER — Ambulatory Visit (INDEPENDENT_AMBULATORY_CARE_PROVIDER_SITE_OTHER): Admitting: Audiology

## 2024-06-03 ENCOUNTER — Ambulatory Visit: Admitting: Nurse Practitioner

## 2024-06-12 ENCOUNTER — Ambulatory Visit: Admitting: Nurse Practitioner

## 2024-07-29 ENCOUNTER — Other Ambulatory Visit

## 2024-07-29 ENCOUNTER — Ambulatory Visit: Admitting: Nurse Practitioner
